# Patient Record
Sex: Female | Born: 1985 | Race: White | Hispanic: No | Smoking: Current every day smoker
Health system: Southern US, Community
[De-identification: ages and names within clinical notes are randomized; demographics above are authoritative.]

## PROBLEM LIST (undated history)

## (undated) DIAGNOSIS — I499 Cardiac arrhythmia, unspecified: Secondary | ICD-10-CM

## (undated) DIAGNOSIS — F419 Anxiety disorder, unspecified: Secondary | ICD-10-CM

## (undated) DIAGNOSIS — Z8489 Family history of other specified conditions: Secondary | ICD-10-CM

## (undated) DIAGNOSIS — T753XXA Motion sickness, initial encounter: Secondary | ICD-10-CM

## (undated) DIAGNOSIS — F32A Depression, unspecified: Secondary | ICD-10-CM

## (undated) DIAGNOSIS — K219 Gastro-esophageal reflux disease without esophagitis: Secondary | ICD-10-CM

## (undated) DIAGNOSIS — F329 Major depressive disorder, single episode, unspecified: Secondary | ICD-10-CM

## (undated) DIAGNOSIS — K529 Noninfective gastroenteritis and colitis, unspecified: Secondary | ICD-10-CM

## (undated) DIAGNOSIS — M419 Scoliosis, unspecified: Secondary | ICD-10-CM

## (undated) DIAGNOSIS — G43909 Migraine, unspecified, not intractable, without status migrainosus: Secondary | ICD-10-CM

## (undated) DIAGNOSIS — M26629 Arthralgia of temporomandibular joint, unspecified side: Secondary | ICD-10-CM

## (undated) DIAGNOSIS — M26649 Arthritis of unspecified temporomandibular joint: Secondary | ICD-10-CM

## (undated) DIAGNOSIS — M2669 Other specified disorders of temporomandibular joint: Secondary | ICD-10-CM

## (undated) DIAGNOSIS — M1990 Unspecified osteoarthritis, unspecified site: Secondary | ICD-10-CM

## (undated) DIAGNOSIS — N83209 Unspecified ovarian cyst, unspecified side: Secondary | ICD-10-CM

## (undated) DIAGNOSIS — G43109 Migraine with aura, not intractable, without status migrainosus: Secondary | ICD-10-CM

## (undated) HISTORY — PX: TEMPOROMANDIBULAR JOINT SURGERY: SHX35

## (undated) HISTORY — PX: OVARIAN CYST REMOVAL: SHX89

## (undated) HISTORY — DX: Migraine with aura, not intractable, without status migrainosus: G43.109

---
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
## 2004-08-13 ENCOUNTER — Observation Stay: Payer: Self-pay | Admitting: Certified Nurse Midwife

## 2004-12-20 ENCOUNTER — Observation Stay: Payer: Self-pay | Admitting: Obstetrics and Gynecology

## 2004-12-22 ENCOUNTER — Inpatient Hospital Stay: Payer: Self-pay | Admitting: Certified Nurse Midwife

## 2005-04-25 ENCOUNTER — Emergency Department: Payer: Self-pay | Admitting: Emergency Medicine

## 2007-11-23 ENCOUNTER — Ambulatory Visit: Payer: Self-pay | Admitting: Family Medicine

## 2008-04-29 ENCOUNTER — Ambulatory Visit: Payer: Self-pay | Admitting: Internal Medicine

## 2008-05-03 ENCOUNTER — Other Ambulatory Visit: Payer: Self-pay

## 2008-05-03 ENCOUNTER — Emergency Department: Payer: Self-pay | Admitting: Emergency Medicine

## 2008-05-04 ENCOUNTER — Ambulatory Visit: Payer: Self-pay | Admitting: Internal Medicine

## 2008-05-11 ENCOUNTER — Emergency Department: Payer: Self-pay | Admitting: Emergency Medicine

## 2008-11-29 ENCOUNTER — Emergency Department: Payer: Self-pay | Admitting: Emergency Medicine

## 2009-01-06 ENCOUNTER — Emergency Department: Payer: Self-pay | Admitting: Emergency Medicine

## 2009-01-08 ENCOUNTER — Ambulatory Visit: Payer: Self-pay | Admitting: Emergency Medicine

## 2009-03-04 ENCOUNTER — Encounter: Payer: Self-pay | Admitting: Obstetrics and Gynecology

## 2009-09-05 ENCOUNTER — Inpatient Hospital Stay: Payer: Self-pay | Admitting: Obstetrics and Gynecology

## 2009-11-23 DIAGNOSIS — M26609 Unspecified temporomandibular joint disorder, unspecified side: Secondary | ICD-10-CM | POA: Insufficient documentation

## 2012-02-16 ENCOUNTER — Ambulatory Visit: Payer: Self-pay | Admitting: Internal Medicine

## 2012-05-20 DIAGNOSIS — F329 Major depressive disorder, single episode, unspecified: Secondary | ICD-10-CM | POA: Insufficient documentation

## 2013-09-08 ENCOUNTER — Ambulatory Visit: Payer: Self-pay | Admitting: Medical

## 2014-11-19 ENCOUNTER — Ambulatory Visit: Payer: Self-pay | Admitting: Physician Assistant

## 2015-01-12 ENCOUNTER — Ambulatory Visit
Admit: 2015-01-12 | Disposition: A | Discharge status: Home / Self Care | Payer: Self-pay | Attending: Family Medicine | Admitting: Family Medicine

## 2015-03-18 ENCOUNTER — Encounter: Payer: Self-pay | Admitting: Registered Nurse

## 2015-03-18 ENCOUNTER — Ambulatory Visit
Admission: EM | Admit: 2015-03-18 | Discharge: 2015-03-18 | Disposition: A | Discharge status: Home / Self Care | Payer: Medicaid Other | Attending: Internal Medicine | Admitting: Internal Medicine

## 2015-03-18 DIAGNOSIS — M2660 Temporomandibular joint disorder, unspecified: Secondary | ICD-10-CM | POA: Diagnosis not present

## 2015-03-18 DIAGNOSIS — L2489 Irritant contact dermatitis due to other agents: Secondary | ICD-10-CM | POA: Insufficient documentation

## 2015-03-18 DIAGNOSIS — Z79899 Other long term (current) drug therapy: Secondary | ICD-10-CM | POA: Insufficient documentation

## 2015-03-18 DIAGNOSIS — L253 Unspecified contact dermatitis due to other chemical products: Secondary | ICD-10-CM | POA: Diagnosis not present

## 2015-03-18 DIAGNOSIS — N3090 Cystitis, unspecified without hematuria: Secondary | ICD-10-CM | POA: Diagnosis present

## 2015-03-18 DIAGNOSIS — F17210 Nicotine dependence, cigarettes, uncomplicated: Secondary | ICD-10-CM | POA: Insufficient documentation

## 2015-03-18 DIAGNOSIS — N390 Urinary tract infection, site not specified: Secondary | ICD-10-CM

## 2015-03-18 DIAGNOSIS — M1990 Unspecified osteoarthritis, unspecified site: Secondary | ICD-10-CM | POA: Diagnosis not present

## 2015-03-18 HISTORY — DX: Other specified disorders of temporomandibular joint: M26.69

## 2015-03-18 HISTORY — DX: Arthritis of unspecified temporomandibular joint: M26.649

## 2015-03-18 HISTORY — DX: Unspecified osteoarthritis, unspecified site: M19.90

## 2015-03-18 LAB — URINALYSIS COMPLETE WITH MICROSCOPIC (ARMC ONLY)
Bacteria, UA: NONE SEEN — AB
Glucose, UA: NEGATIVE mg/dL
Nitrite: NEGATIVE
PH: 6 (ref 5.0–8.0)
PROTEIN: 100 mg/dL — AB
Specific Gravity, Urine: 1.025 (ref 1.005–1.030)

## 2015-03-18 MED ORDER — NITROFURANTOIN MONOHYD MACRO 100 MG PO CAPS: 100.0000 mg | ORAL_CAPSULE | Freq: Two times a day (BID) | ORAL | Status: DC

## 2015-03-18 MED ORDER — PHENAZOPYRIDINE HCL 200 MG PO TABS: 200.0000 mg | ORAL_TABLET | Freq: Three times a day (TID) | ORAL | Status: DC | PRN

## 2015-03-18 NOTE — ED Notes (Signed)
Started 1 week ago with suprapubic pain when voiding. + burning with voiding and small amounts frequently.

## 2015-03-18 NOTE — Discharge Instructions (Signed)
Contact Dermatitis °Contact dermatitis is a reaction to certain substances that touch the skin. Contact dermatitis can be either irritant contact dermatitis or allergic contact dermatitis. Irritant contact dermatitis does not require previous exposure to the substance for a reaction to occur. Allergic contact dermatitis only occurs if you have been exposed to the substance before. Upon a repeat exposure, your body reacts to the substance.  °CAUSES  °Many substances can cause contact dermatitis. Irritant dermatitis is most commonly caused by repeated exposure to mildly irritating substances, such as: °· Makeup. °· Soaps. °· Detergents. °· Bleaches. °· Acids. °· Metal salts, such as nickel. °Allergic contact dermatitis is most commonly caused by exposure to: °· Poisonous plants. °· Chemicals (deodorants, shampoos). °· Jewelry. °· Latex. °· Neomycin in triple antibiotic cream. °· Preservatives in products, including clothing. °SYMPTOMS  °The area of skin that is exposed may develop: °· Dryness or flaking. °· Redness. °· Cracks. °· Itching. °· Pain or a burning sensation. °· Blisters. °With allergic contact dermatitis, there may also be swelling in areas such as the eyelids, mouth, or genitals.  °DIAGNOSIS  °Your caregiver can usually tell what the problem is by doing a physical exam. In cases where the cause is uncertain and an allergic contact dermatitis is suspected, a patch skin test may be performed to help determine the cause of your dermatitis. °TREATMENT °Treatment includes protecting the skin from further contact with the irritating substance by avoiding that substance if possible. Barrier creams, powders, and gloves may be helpful. Your caregiver may also recommend: °· Steroid creams or ointments applied 2 times daily. For best results, soak the rash area in cool water for 20 minutes. Then apply the medicine. Cover the area with a plastic wrap. You can store the steroid cream in the refrigerator for a "chilly"  effect on your rash. That may decrease itching. Oral steroid medicines may be needed in more severe cases. °· Antibiotics or antibacterial ointments if a skin infection is present. °· Antihistamine lotion or an antihistamine taken by mouth to ease itching. °· Lubricants to keep moisture in your skin. °· Burow's solution to reduce redness and soreness or to dry a weeping rash. Mix one packet or tablet of solution in 2 cups cool water. Dip a clean washcloth in the mixture, wring it out a bit, and put it on the affected area. Leave the cloth in place for 30 minutes. Do this as often as possible throughout the day. °· Taking several cornstarch or baking soda baths daily if the area is too large to cover with a washcloth. °Harsh chemicals, such as alkalis or acids, can cause skin damage that is like a burn. You should flush your skin for 15 to 20 minutes with cold water after such an exposure. You should also seek immediate medical care after exposure. Bandages (dressings), antibiotics, and pain medicine may be needed for severely irritated skin.  °HOME CARE INSTRUCTIONS °· Avoid the substance that caused your reaction. °· Keep the area of skin that is affected away from hot water, soap, sunlight, chemicals, acidic substances, or anything else that would irritate your skin. °· Do not scratch the rash. Scratching may cause the rash to become infected. °· You may take cool baths to help stop the itching. °· Only take over-the-counter or prescription medicines as directed by your caregiver. °· See your caregiver for follow-up care as directed to make sure your skin is healing properly. °SEEK MEDICAL CARE IF:  °· Your condition is not better after 3   days of treatment.  You seem to be getting worse.  You see signs of infection such as swelling, tenderness, redness, soreness, or warmth in the affected area.  You have any problems related to your medicines. Document Released: 09/08/2000 Document Revised: 12/04/2011  Document Reviewed: 02/14/2011 [facility] Patient Information 2015 [place], [place]. This information is not intended to replace advice given to you by your health care provider. Make sure you discuss any questions you have with your health care provider. Urinary Tract Infection Urinary tract infections (UTIs) can develop anywhere along your urinary tract. Your urinary tract is your body's drainage system for removing wastes and extra water. Your urinary tract includes two kidneys, two ureters, a bladder, and a urethra. Your kidneys are a pair of bean-shaped organs. Each kidney is about the size of your fist. They are located below your ribs, one on each side of your spine. CAUSES Infections are caused by microbes, which are microscopic organisms, including fungi, viruses, and bacteria. These organisms are so small that they can only be seen through a microscope. Bacteria are the microbes that most commonly cause UTIs. SYMPTOMS  Symptoms of UTIs may vary by age and gender of the patient and by the location of the infection. Symptoms in young women typically include a frequent and intense urge to urinate and a painful, burning feeling in the bladder or urethra during urination. Older women and men are more likely to be tired, shaky, and weak and have muscle aches and abdominal pain. A fever may mean the infection is in your kidneys. Other symptoms of a kidney infection include pain in your back or sides below the ribs, nausea, and vomiting. DIAGNOSIS To diagnose a UTI, your caregiver will ask you about your symptoms. Your caregiver also will ask to provide a urine sample. The urine sample will be tested for bacteria and white blood cells. White blood cells are made by your body to help fight infection. TREATMENT  Typically, UTIs can be treated with medication. Because most UTIs are caused by a bacterial infection, they usually can be treated with the use of antibiotics. The choice of antibiotic and length of  treatment depend on your symptoms and the type of bacteria causing your infection. HOME CARE INSTRUCTIONS  If you were prescribed antibiotics, take them exactly as your caregiver instructs you. Finish the medication even if you feel better after you have only taken some of the medication.  Drink enough water and fluids to keep your urine clear or pale yellow.  Avoid caffeine, tea, and carbonated beverages. They tend to irritate your bladder.  Empty your bladder often. Avoid holding urine for long periods of time.  Empty your bladder before and after sexual intercourse.  After a bowel movement, women should cleanse from front to back. Use each tissue only once. SEEK MEDICAL CARE IF:   You have back pain.  You develop a fever.  Your symptoms do not begin to resolve within 3 days. SEEK IMMEDIATE MEDICAL CARE IF:   You have severe back pain or lower abdominal pain.  You develop chills.  You have nausea or vomiting.  You have continued burning or discomfort with urination. MAKE SURE YOU:   Understand these instructions.  Will watch your condition.  Will get help right away if you are not doing well or get worse. Document Released: 06/21/2005 Document Revised: 03/12/2012 Document Reviewed: 10/20/2011 [facility] Patient Information 2015 [place], [place]. This information is not intended to replace advice given to you by your health  care provider. Make sure you discuss any questions you have with your health care provider.

## 2015-03-18 NOTE — ED Provider Notes (Addendum)
CSN: [id number]     Arrival date & time 03/18/15  1240 History   First MD Initiated Contact with Patient 03/18/15 1324     Chief Complaint  Patient presents with  . Cystitis   (Consider location/radiation/quality/duration/timing/severity/associated sxs/prior Treatment) HPI Comments: Caucasian female here for dysuria/cramping x 1 week. New sexual partner in past month not using condoms.  Had low back pain yesterday that has resolved.  New rash upper chest noticed today recently had red hilights placed in her blond hair.  Sometimes gets a couple lesions/outbreaks with menstrual cycle but this is many more lesions than usual  [employer] started summer vacation 2 weeks ago.  The history is provided by the patient.    Past Medical History  Diagnosis Date  . Arthritis   . TMJ arthritis    Past Surgical History  Procedure Laterality Date  . Temporomandibular joint surgery     Family History  Problem Relation Age of Onset  . Heart attack Mother   . Hypertension Father    History  Substance Use Topics  . Smoking status: Current Every Day Smoker -- 0.50 packs/day    Types: Cigarettes  . Smokeless tobacco: Not on file  . Alcohol Use: Yes     Comment: rarely   OB History    No data available     Review of Systems  Constitutional: Negative for fever, chills, diaphoresis, activity change, appetite change and fatigue.  HENT: Negative for congestion, ear pain, facial swelling and mouth sores.   Eyes: Negative for photophobia, pain, discharge, redness, itching and visual disturbance.  Respiratory: Negative for cough, choking, shortness of breath, wheezing and stridor.   Cardiovascular: Negative for chest pain, palpitations and leg swelling.  Gastrointestinal: Positive for abdominal pain. Negative for nausea, vomiting, diarrhea, constipation, blood in stool, abdominal distention, anal bleeding and rectal pain.  Endocrine: Negative for cold intolerance and heat intolerance.   Genitourinary: Positive for dysuria and frequency. Negative for hematuria, vaginal bleeding, vaginal discharge, difficulty urinating and vaginal pain.  Musculoskeletal: Positive for back pain. Negative for myalgias, joint swelling, arthralgias, gait problem, neck pain and neck stiffness.  Allergic/Immunologic: Negative for environmental allergies and food allergies.  Neurological: Negative for dizziness, tremors, syncope, facial asymmetry, speech difficulty, weakness, light-headedness and headaches.  Hematological: Negative for adenopathy. Does not bruise/bleed easily.  Psychiatric/Behavioral: Negative for behavioral problems, confusion, sleep disturbance and agitation.    Allergies  Review of patient's allergies indicates no known allergies.  Home Medications   Prior to Admission medications   Medication Sig Start Date End Date Taking? Authorizing Provider  celecoxib (CELEBREX) 50 MG capsule Take 50 mg by mouth.   Yes Historical Provider, MD  sertraline (ZOLOFT) 50 MG tablet Take 50 mg by mouth daily.   Yes Historical Provider, MD  nitrofurantoin, macrocrystal-monohydrate, (MACROBID) 100 MG capsule Take 1 capsule (100 mg total) by mouth 2 (two) times daily. 03/18/15   Barbaraann Barthel, NP  phenazopyridine (PYRIDIUM) 200 MG tablet Take 1 tablet (200 mg total) by mouth 3 (three) times daily as needed for pain. 03/18/15   Jarold Song Betancourt, NP   BP 124/87 mmHg  Pulse 113  Temp(Src) 98 F (36.7 C) (Oral)  Resp 16  Ht  (1.575 m)  Wt 150 lb (68.04 kg)  BMI 27.43 kg/m2  SpO2 100%  LMP 02/26/2015 (Approximate) Physical Exam  Constitutional: She is oriented to person, place, and time. Vital signs are normal. She appears well-developed and well-nourished. No distress.  HENT:  Head:  Normocephalic and atraumatic.  Right Ear: External ear normal.  Left Ear: External ear normal.  Nose: Nose normal.  Mouth/Throat: Oropharynx is clear and moist. No oropharyngeal exudate.  Eyes:  Conjunctivae, EOM and lids are normal. Pupils are equal, round, and reactive to light. Right eye exhibits no discharge. Left eye exhibits no discharge. No scleral icterus.  Neck: Trachea normal and normal range of motion. Neck supple. No tracheal deviation present. No thyromegaly present.  Cardiovascular: Normal rate, regular rhythm, normal heart sounds and intact distal pulses.  Exam reveals no gallop and no friction rub.   No murmur heard. Pulmonary/Chest: Effort normal and breath sounds normal. No stridor.  Abdominal: Soft. Bowel sounds are normal. She exhibits no distension and no mass. There is tenderness in the epigastric area. There is no rigidity, no rebound, no guarding, no CVA tenderness, no tenderness at McBurney's point and negative Murphy's sign.  Dull to percussion x 4 quads  Musculoskeletal: Normal range of motion. She exhibits no edema or tenderness.  Lymphadenopathy:    She has no cervical adenopathy.  Neurological: She is alert and oriented to person, place, and time. She displays normal reflexes. No cranial nerve deficit. She exhibits normal muscle tone. Coordination normal.  Skin: Skin is warm, dry and intact. Rash noted. Rash is maculopapular and pustular. She is not diaphoretic. There is erythema. No pallor.  Dry Closed comedone with fine pustule centrally scattered upper chest posterior neck/shoulders with slight erythema not TTP  Psychiatric: She has a normal mood and affect. Her speech is normal and behavior is normal. Judgment and thought content normal. Cognition and memory are normal.  Nursing note and vitals reviewed.   ED Course  Procedures (including critical care time) Labs Review Labs Reviewed  URINALYSIS COMPLETEWITH MICROSCOPIC ([facility] ONLY) - Abnormal; Notable for the following:    Color, Urine AMBER (*)    APPearance CLOUDY (*)    Bilirubin Urine 1+ (*)    Ketones, ur TRACE (*)    Hgb urine dipstick 3+ (*)    Protein, ur 100 (*)    Leukocytes, UA 2+ (*)     Bacteria, UA NONE SEEN (*)    Squamous Epithelial / LPF 0-5 (*)    All other components within normal limits  URINE CULTURE    Imaging Review No results found.   MDM   1. Dermatitis, contact, from chemicals   2. UTI (lower urinary tract infection)    Medications as directed.  Given Rx for macrobid  po BID and pyridium  po TID prnPatient is also to push fluids and may use Pyridium po as needed. Call or return to clinic as needed if these symptoms worsen or fail to improve as anticipated.  Exitcare handout on cystitis given to patient Patient verbalized agreement and understanding of treatment plan and had no further questions at this time. P2:  Hydrate and cranberry juice Medication as directed e.g. OTC benadryl, hydrocortisone, calamine lotion.  Symptomatic therapy suggested.  Warm to cool water soaks and/or oatmeal baths.  Call or return to clinic as needed if these symptoms worsen or fail to improve as anticipated.  Exitcare handout on dermatitis given to patient.  Patient verbalized agreement and understanding of treatment plan.   P2:  Avoidance and hand washing.    Barbaraann Barthel, NP 03/18/15 1428  Urine culture e.coli sensitive to macrobid.  Telephone message left for patient with results and she is to contact clinic if symptoms not resolving or further questions.  Inetta Fermo A  Betancourt, NP 03/20/15 1825

## 2015-03-20 LAB — URINE CULTURE

## 2015-05-26 ENCOUNTER — Ambulatory Visit
Admission: EM | Admit: 2015-05-26 | Discharge: 2015-05-26 | Disposition: A | Discharge status: Home / Self Care | Payer: Medicaid Other | Attending: Family Medicine | Admitting: Family Medicine

## 2015-05-26 ENCOUNTER — Encounter: Payer: Self-pay | Admitting: Emergency Medicine

## 2015-05-26 DIAGNOSIS — M1990 Unspecified osteoarthritis, unspecified site: Secondary | ICD-10-CM | POA: Insufficient documentation

## 2015-05-26 DIAGNOSIS — F17210 Nicotine dependence, cigarettes, uncomplicated: Secondary | ICD-10-CM | POA: Insufficient documentation

## 2015-05-26 DIAGNOSIS — N390 Urinary tract infection, site not specified: Secondary | ICD-10-CM | POA: Diagnosis not present

## 2015-05-26 DIAGNOSIS — R350 Frequency of micturition: Secondary | ICD-10-CM | POA: Diagnosis present

## 2015-05-26 DIAGNOSIS — R300 Dysuria: Secondary | ICD-10-CM | POA: Diagnosis present

## 2015-05-26 LAB — URINALYSIS COMPLETE WITH MICROSCOPIC (ARMC ONLY): PH: 0 — AB (ref 5.0–8.0)

## 2015-05-26 LAB — PREGNANCY, URINE: Preg Test, Ur: NEGATIVE

## 2015-05-26 MED ORDER — CIPROFLOXACIN HCL 500 MG PO TABS: 500.0000 mg | ORAL_TABLET | Freq: Two times a day (BID) | ORAL | Status: DC

## 2015-05-26 NOTE — ED Provider Notes (Signed)
[facility] Emergency Department Provider Note  ____________________________________________  Time seen: Approximately 1:17 PM  I have reviewed the triage vital signs and the nursing notes.   HISTORY  Chief Complaint Urinary Tract Infection   HPI Vicki Dominguez is a 29 y.o. female presents for 3 days of urinary frequency and burning with urination. States feels like UTI. Denies back pain, abdominal pain, vomiting, nausea. Denies vaginal discharge, vaginal odor, or recent change in sexual partner. Denies concern for STDs. Reports same sexual partner for greater than 6 months and always uses condoms. States does not always void after intercourse, especially recently. Denies pain except for during voiding. States took OTC AZO today which helped with burning. States last UTI 3 weeks ago and treated with bactrim, and states approximately one month prior to that UTI treated with macrobid. States prior to last two UTIs no UTI in several years. Reports continues to eat and drink well.    Past Medical History  Diagnosis Date  . Arthritis   . TMJ arthritis     There are no active problems to display for this patient.   Past Surgical History  Procedure Laterality Date  . Temporomandibular joint surgery      Current Outpatient Rx  Name  Route  Sig  Dispense  Refill  .           .           .           .           .             Allergies Review of patient's allergies indicates no known allergies.  Family History  Problem Relation Age of Onset  . Heart attack Mother   . Hypertension Father     Social History Social History  Substance Use Topics  . Smoking status: Current Every Day Smoker -- 0.50 packs/day    Types: Cigarettes  . Smokeless tobacco: None  . Alcohol Use: Yes     Comment: rarely    Review of Systems Constitutional: No fever/chills Eyes: No visual changes. ENT: No sore throat. Cardiovascular: Denies chest pain. Respiratory:  Denies shortness of breath. Gastrointestinal: No abdominal pain.  No nausea, no vomiting.  No diarrhea.  No constipation. Genitourinary: positive for dysuria. Musculoskeletal: Negative for back pain. Skin: Negative for rash. Neurological: Negative for headaches, focal weakness or numbness.  10-point ROS otherwise negative.  ____________________________________________   PHYSICAL EXAM:  VITAL SIGNS: ED Triage Vitals  Enc Vitals Group     BP 05/26/15 1224 111/76 mmHg     Pulse Rate 05/26/15 1224 92     Resp 05/26/15 1224 16     Temp 05/26/15 1224 97.9 F (36.6 C)     Temp Source 05/26/15 1224 Oral     SpO2 05/26/15 1224 99 %     Weight 05/26/15 1224 150 lb (68.04 kg)     Height 05/26/15 1224  (1.575 m)     Head Cir --      Peak Flow --      Pain Score 05/26/15 1227 3     Pain Loc --      Pain Edu? --      Excl. in GC? --     Constitutional: Alert and oriented. Well appearing and in no acute distress. Eyes: Conjunctivae are normal. PERRL. EOMI. Head: Atraumatic.  Nose: No congestion/rhinnorhea.  Mouth/Throat: Mucous membranes are moist.  Oropharynx  non-erythematous.  Neck: No stridor.  No cervical spine tenderness to palpation. Hematological/Lymphatic/Immunilogical: No cervical lymphadenopathy. Cardiovascular: Normal rate, regular rhythm. Grossly normal heart sounds.  Good peripheral circulation. Respiratory: Normal respiratory effort.  No retractions. Lungs CTAB. Gastrointestinal: Soft and nontender. No distention. Normal Bowel sounds.  No CVA tenderness. Musculoskeletal: No lower or upper extremity tenderness nor edema.   Neurologic:  Normal speech and language. No gross focal neurologic deficits are appreciated. No gait instability. Skin:  Skin is warm, dry and intact. No rash noted. Psychiatric: Mood and affect are normal. Speech and behavior are normal.  ____________________________________________   LABS (all labs ordered are listed, but only abnormal  results are displayed)  Labs Reviewed  URINALYSIS COMPLETEWITH MICROSCOPIC ([facility] ONLY) - Abnormal; Notable for the following:    Color, Urine ORANGE (*)    APPearance HAZY (*)    Glucose, UA   (*)    Value: TEST NOT REPORTED DUE TO COLOR INTERFERENCE OF URINE PIGMENT   Bilirubin Urine   (*)    Value: TEST NOT REPORTED DUE TO COLOR INTERFERENCE OF URINE PIGMENT   Ketones, ur   (*)    Value: TEST NOT REPORTED DUE TO COLOR INTERFERENCE OF URINE PIGMENT   Hgb urine dipstick   (*)    Value: TEST NOT REPORTED DUE TO COLOR INTERFERENCE OF URINE PIGMENT   pH 0.0 (*)    Protein, ur   (*)    Value: TEST NOT REPORTED DUE TO COLOR INTERFERENCE OF URINE PIGMENT   Nitrite   (*)    Value: TEST NOT REPORTED DUE TO COLOR INTERFERENCE OF URINE PIGMENT   Leukocytes, UA   (*)    Value: TEST NOT REPORTED DUE TO COLOR INTERFERENCE OF URINE PIGMENT   Bacteria, UA FEW (*)    Squamous Epithelial / LPF 6-30 (*)    All other components within normal limits  URINE CULTURE  PREGNANCY, URINE  urine pregnancy negative  ________________________________________   INITIAL IMPRESSION / ASSESSMENT AND PLAN / ED COURSE  Pertinent labs & imaging results that were available during my care of the patient were reviewed by me and considered in my medical decision making (see chart for details).  Very well appearing. No acute distress. Denies fevers. Reports x 3 days of intermittent dysuria. Urine reviewed, suspect OTC Azo interference with coloration, positive bacteria. Will treat with oral cipro as recent bactrim and macrobid use. Counseled regarding voiding post intercourse. Counseled regarding follow up with PCP next week and follow up especially if UTIs continue and need for further evaluation. Patient verbalized understanding and agreed to plan.  ____________________________________________   FINAL CLINICAL IMPRESSION(S) / ED DIAGNOSES  Final diagnoses:  UTI (lower urinary tract infection)       Renford Dills, NP 05/26/15 1330  Renford Dills, NP 05/26/15 1330

## 2015-05-26 NOTE — Discharge Instructions (Signed)
Take medication as prescribed. Drink plenty of water. Rest.   Follow up with your primary care physician at [facility] next week for follow up.   Return to Urgent care for abdominal pain, fever, continued urinary discomfort, new or worsening concerns.   Urinary Tract Infection Urinary tract infections (UTIs) can develop anywhere along your urinary tract. Your urinary tract is your body's drainage system for removing wastes and extra water. Your urinary tract includes two kidneys, two ureters, a bladder, and a urethra. Your kidneys are a pair of bean-shaped organs. Each kidney is about the size of your fist. They are located below your ribs, one on each side of your spine. CAUSES Infections are caused by microbes, which are microscopic organisms, including fungi, viruses, and bacteria. These organisms are so small that they can only be seen through a microscope. Bacteria are the microbes that most commonly cause UTIs. SYMPTOMS  Symptoms of UTIs may vary by age and gender of the patient and by the location of the infection. Symptoms in young women typically include a frequent and intense urge to urinate and a painful, burning feeling in the bladder or urethra during urination. Older women and men are more likely to be tired, shaky, and weak and have muscle aches and abdominal pain. A fever may mean the infection is in your kidneys. Other symptoms of a kidney infection include pain in your back or sides below the ribs, nausea, and vomiting. DIAGNOSIS To diagnose a UTI, your caregiver will ask you about your symptoms. Your caregiver also will ask to provide a urine sample. The urine sample will be tested for bacteria and white blood cells. White blood cells are made by your body to help fight infection. TREATMENT  Typically, UTIs can be treated with medication. Because most UTIs are caused by a bacterial infection, they usually can be treated with the use of antibiotics. The choice of antibiotic and  length of treatment depend on your symptoms and the type of bacteria causing your infection. HOME CARE INSTRUCTIONS  If you were prescribed antibiotics, take them exactly as your caregiver instructs you. Finish the medication even if you feel better after you have only taken some of the medication.  Drink enough water and fluids to keep your urine clear or pale yellow.  Avoid caffeine, tea, and carbonated beverages. They tend to irritate your bladder.  Empty your bladder often. Avoid holding urine for long periods of time.  Empty your bladder before and after sexual intercourse.  After a bowel movement, women should cleanse from front to back. Use each tissue only once. SEEK MEDICAL CARE IF:   You have back pain.  You develop a fever.  Your symptoms do not begin to resolve within 3 days. SEEK IMMEDIATE MEDICAL CARE IF:   You have severe back pain or lower abdominal pain.  You develop chills.  You have nausea or vomiting.  You have continued burning or discomfort with urination. MAKE SURE YOU:   Understand these instructions.  Will watch your condition.  Will get help right away if you are not doing well or get worse. Document Released: 06/21/2005 Document Revised: 03/12/2012 Document Reviewed: 10/20/2011 [facility] Patient Information 2015 [place], [place]. This information is not intended to replace advice given to you by your health care provider. Make sure you discuss any questions you have with your health care provider.

## 2015-05-26 NOTE — ED Notes (Signed)
uti s/s x 3day

## 2015-05-28 LAB — URINE CULTURE
Culture: >=100000
Special Requests: NORMAL

## 2016-02-24 ENCOUNTER — Ambulatory Visit
Admission: EM | Admit: 2016-02-24 | Discharge: 2016-02-24 | Disposition: A | Discharge status: Home / Self Care | Payer: Medicaid Other | Attending: Family Medicine | Admitting: Family Medicine

## 2016-02-24 DIAGNOSIS — Z8249 Family history of ischemic heart disease and other diseases of the circulatory system: Secondary | ICD-10-CM | POA: Diagnosis not present

## 2016-02-24 DIAGNOSIS — B349 Viral infection, unspecified: Secondary | ICD-10-CM | POA: Diagnosis not present

## 2016-02-24 DIAGNOSIS — K529 Noninfective gastroenteritis and colitis, unspecified: Secondary | ICD-10-CM | POA: Diagnosis not present

## 2016-02-24 DIAGNOSIS — F17210 Nicotine dependence, cigarettes, uncomplicated: Secondary | ICD-10-CM | POA: Insufficient documentation

## 2016-02-24 DIAGNOSIS — J029 Acute pharyngitis, unspecified: Secondary | ICD-10-CM | POA: Diagnosis not present

## 2016-02-24 LAB — RAPID STREP SCREEN (MED CTR MEBANE ONLY): STREPTOCOCCUS, GROUP A SCREEN (DIRECT): NEGATIVE

## 2016-02-24 MED ORDER — BISMUTH SUBSALICYLATE 262 MG/15ML PO SUSP: ORAL | Status: DC

## 2016-02-24 MED ORDER — ONDANSETRON 8 MG PO TBDP: 8.0000 mg | ORAL_TABLET | Freq: Three times a day (TID) | ORAL | Status: DC | PRN

## 2016-02-24 MED ORDER — ONDANSETRON 8 MG PO TBDP
8.0000 mg | ORAL_TABLET | Freq: Once | ORAL | Status: AC
  Administered 2016-02-24: 8 mg via ORAL

## 2016-02-24 MED ORDER — RANITIDINE HCL 150 MG PO CAPS: ORAL_CAPSULE | ORAL | Status: DC

## 2016-02-24 NOTE — Discharge Instructions (Signed)
Norovirus Infection A norovirus infection is caused by exposure to a virus in a group of similar viruses (noroviruses). This type of infection causes inflammation in your stomach and intestines (gastroenteritis). Norovirus is the most common cause of gastroenteritis. It also causes food poisoning. Anyone can get a norovirus infection. It spreads very easily (contagious). You can get it from contaminated food, water, surfaces, or other people. Norovirus is found in the stool or vomit of infected people. You can spread the infection as soon as you feel sick until 2 weeks after you recover.  Symptoms usually begin within 2 days after you become infected. Most norovirus symptoms affect the digestive system. CAUSES Norovirus infection is caused by contact with norovirus. You can catch norovirus if you:  Eat or drink something contaminated with norovirus.  Touch surfaces or objects contaminated with norovirus and then put your hand in your mouth.  Have direct contact with an infected person who has symptoms.  Share food, drink, or utensils with someone with who is sick with norovirus. SIGNS AND SYMPTOMS Symptoms of norovirus may include:  Nausea.  Vomiting.  Diarrhea.  Stomach cramps.  Fever.  Chills.  Headache.  Muscle aches.  Tiredness. DIAGNOSIS Your health care provider may suspect norovirus based on your symptoms and physical exam. Your health care provider may also test a sample of your stool or vomit for the virus.  TREATMENT There is no specific treatment for norovirus. Most people get better without treatment in about 2 days. HOME CARE INSTRUCTIONS  Replace lost fluids by drinking plenty of water or rehydration fluids containing important minerals called electrolytes. This prevents dehydration. Drink enough fluid to keep your urine clear or pale yellow.  Do not prepare food for others while you are infected. Wait at least 3 days after recovering from the illness to do  that. PREVENTION   Wash your hands often, especially after using the toilet or changing a diaper.  Wash fruits and vegetables thoroughly before preparing or serving them.  Throw out any food that a sick person may have touched.  Disinfect contaminated surfaces immediately after someone in the household has been sick. Use a bleach-based household cleaner.  Immediately remove and wash soiled clothes or sheets. SEEK MEDICAL CARE IF:  Your vomiting, diarrhea, and stomach pain is getting worse.  Your symptoms of norovirus do not go away after 2-3 days. SEEK IMMEDIATE MEDICAL CARE IF:  You develop symptoms of dehydration that do not improve with fluid replacement. This may include:  Excessive sleepiness.  Lack of tears.  Dry mouth.  Dizziness when standing.  Weak pulse.   This information is not intended to replace advice given to you by your health care provider. Make sure you discuss any questions you have with your health care provider.   Document Released: 12/02/2002 Document Revised: 10/02/2014 Document Reviewed: 02/19/2014 Elsevier Interactive Patient Education 2016 Else[employer] Inc[employer]  Pharyngitis Pharyngitis is a sore throat (pharynx). There is redness, pain, and swelling of your throat. HOME CARE   Drink enough fluids to keep your pee (urine) clear or pale yellow.  Only take medicine as told by your doctor.  You may get sick again if you do not take medicine as told. Finish your medicines, even if you start to feel better.  Do not take aspirin.  Rest.  Rinse your mouth (gargle) with salt water ( tsp of salt per 1 qt of water) every 1-2 hours. This will help the pain.  If you are not at risk for  choking, you can suck on hard candy or sore throat lozenges. GET HELP IF:  You have large, tender lumps on your neck.  You have a rash.  You cough up green, yellow-brown, or bloody spit. GET HELP RIGHT AWAY IF:   You have a stiff neck.  You drool or cannot swallow  liquids.  You throw up (vomit) or are not able to keep medicine or liquids down.  You have very bad pain that does not go away with medicine.  You have problems breathing (not from a stuffy nose). MAKE SURE YOU:   Understand these instructions.  Will watch your condition.  Will get help right away if you are not doing well or get worse.   This information is not intended to replace advice given to you by your health care provider. Make sure you discuss any questions you have with your health care provider.   Document Released: 02/28/2008 Document Revised: 07/02/2013 Document Reviewed: 05/19/2013 Elsevier Interactive Patient Education 2016 Elsevier Inc.  Upper Respiratory Infection, Adult Most upper respiratory infections (URIs) are caused by a virus. A URI affects the nose, throat, and upper air passages. The most common type of URI is often called "the common cold." HOME CARE   Take medicines only as told by your doctor.  Gargle warm saltwater or take cough drops to comfort your throat as told by your doctor.  Use a warm mist humidifier or inhale steam from a shower to increase air moisture. This may make it easier to breathe.  Drink enough fluid to keep your pee (urine) clear or pale yellow.  Eat soups and other clear broths.  Have a healthy diet.  Rest as needed.  Go back to work when your fever is gone or your doctor says it is okay.  You may need to stay home longer to avoid giving your URI to others.  You can also wear a face mask and wash your hands often to prevent spread of the virus.  Use your inhaler more if you have asthma.  Do not use any tobacco products, including cigarettes, chewing tobacco, or electronic cigarettes. If you need help quitting, ask your doctor. GET HELP IF:  You are getting worse, not better.  Your symptoms are not helped by medicine.  You have chills.  You are getting more short of breath.  You have brown or red mucus.  You  have yellow or brown discharge from your nose.  You have pain in your face, especially when you bend forward.  You have a fever.  You have puffy (swollen) neck glands.  You have pain while swallowing.  You have white areas in the back of your throat. GET HELP RIGHT AWAY IF:   You have very bad or constant:  Headache.  Ear pain.  Pain in your forehead, behind your eyes, and over your cheekbones (sinus pain).  Chest pain.  You have long-lasting (chronic) lung disease and any of the following:  Wheezing.  Long-lasting cough.  Coughing up blood.  A change in your usual mucus.  You have a stiff neck.  You have changes in your:  Vision.  Hearing.  Thinking.  Mood. MAKE SURE YOU:   Understand these instructions.  Will watch your condition.  Will get help right away if you are not doing well or get worse.   This information is not intended to replace advice given to you by your health care provider. Make sure you discuss any questions you have with your  health care provider.   Document Released: 02/28/2008 Document Revised: 01/26/2015 Document Reviewed: 12/17/2013 Elsevier Interactive Patient Education [employer] Elsevier Inc.

## 2016-02-24 NOTE — ED Notes (Signed)
Patient complains of sore throat, diarrhea. Patient states that Diarrhea started yesterday and she has been having some abdominal gurgling. Patient states that sore throat started this morning.

## 2016-02-24 NOTE — ED Provider Notes (Signed)
CSN: [id number]     Arrival date & time 02/24/16  1854 History   First MD Initiated Contact with Patient 02/24/16 2039    Nurses notes were reviewed. Chief Complaint  Patient presents with  . Sore Throat  Patient's here because of sore throat and stomach pain. She states that everything started yesterday. The abdominal discomfort got worse as the day went on and her sore throat became worse as well. States that she was having diarrhea and abdominal discomfort. States she feels her GI track consistent turning. Diarrhea was described as being very loose along with the diarrhea that just became sore. She denies any fever or being around anyone who is sick.  Patient denies having urinary frequency or burning with urination.  Unfortunately patient does smoke. She's had TMJ surgery. Mother with MI and father with hypertension. 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    (Consider location/radiation/quality/duration/timing/severity/associated sxs/prior Treatment) Patient is a 30 y.o. female presenting with pharyngitis and abdominal pain. The history is provided by the patient. No language interpreter was used.  Sore Throat This is a new problem. The current episode started yesterday. The problem occurs constantly. The problem has been gradually worsening. Associated symptoms include abdominal pain. Pertinent negatives include no chest pain, no headaches and no shortness of breath. Nothing aggravates the symptoms. Nothing relieves the symptoms. She has tried nothing for the symptoms.  Abdominal Pain Pain location:  Generalized Pain radiates to:  Does not radiate Onset quality:  Sudden Progression:  Waxing and waning Context: not medication withdrawal   Associated symptoms: diarrhea and nausea   Associated symptoms: no chest pain and no shortness of breath     Past Medical History  Diagnosis Date  . Arthritis   . TMJ arthritis    Past Surgical History  Procedure Laterality Date  .  Temporomandibular joint surgery     Family History  Problem Relation Age of Onset  . Heart attack Mother   . Hypertension Father    Social History  Substance Use Topics  . Smoking status: Current Every Day Smoker -- 0.50 packs/day    Types: Cigarettes  . Smokeless tobacco: None  . Alcohol Use: 0.0 oz/week    0 Standard drinks or equivalent per week     Comment: occasionally   OB History    No data available     Review of Systems  Respiratory: Negative for shortness of breath.   Cardiovascular: Negative for chest pain.  Gastrointestinal: Positive for nausea, abdominal pain and diarrhea.  Neurological: Negative for headaches.  All other systems reviewed and are negative.   Allergies  Review of patient's allergies indicates no known allergies.  Home Medications   Prior to Admission medications   Medication Sig Start Date End Date Taking? Authorizing Provider  ALPRAZolam Prudy Feeler) 1 MG tablet Take 1 mg by mouth at bedtime as needed for anxiety.   Yes Historical Provider, MD  ibuprofen (ADVIL,MOTRIN) 200 MG tablet Take 200 mg by mouth every 6 (six) hours as needed.   Yes Historical Provider, MD  PARoxetine (PAXIL) 20 MG tablet Take 20 mg by mouth daily.   Yes Historical Provider, MD  promethazine (PHENERGAN) 25 MG tablet Take 25 mg by mouth every 6 (six) hours as needed for nausea or vomiting.   Yes Historical Provider, MD  bismuth subsalicylate (PEPTO-BISMOL) 262 MG/15ML suspension 2 tablespoon or one double strength tablet 3 times a day with Zantac for the next 3-5 days 02/24/16   Hassan Rowan, MD  celecoxib (CELEBREX) 50 MG capsule Take 50 mg by mouth.    Historical Provider, MD  ciprofloxacin (CIPRO) 500 MG tablet Take 1 tablet (500 mg total) by mouth 2 (two) times daily. For 7 days 05/26/15   Renford DillsLindsey Miller, NP  nitrofurantoin, macrocrystal-monohydrate, (MACROBID) 100 MG capsule Take 1 capsule (100 mg total) by mouth 2 (two) times daily. 03/18/15   Barbaraann Barthelina A Betancourt, NP   ondansetron (ZOFRAN ODT) 8 MG disintegrating tablet Take 1 tablet (8 mg total) by mouth every 8 (eight) hours as needed for nausea or vomiting. 02/24/16   Hassan RowanEugene Yu Cragun, MD  phenazopyridine (PYRIDIUM) 200 MG tablet Take 1 tablet (200 mg total) by mouth 3 (three) times daily as needed for pain. 03/18/15   Barbaraann Barthelina A Betancourt, NP  ranitidine (ZANTAC) 150 MG capsule 1 capsule 3 times a day for the next 3-5 days, take the Pepto-Bismol 02/24/16   Hassan RowanEugene Kaileia Flow, MD  sertraline (ZOLOFT) 50 MG tablet Take 100 mg by mouth daily.     Historical Provider, MD   Meds Ordered and Administered this Visit   Medications  ondansetron (ZOFRAN-ODT) disintegrating tablet 8 mg (8 mg Oral Given 02/24/16 2119)    BP 115/84 mmHg  Pulse 116  Temp(Src) 98.7 F (37.1 C) (Tympanic)  Resp 16  Ht 5\' 2"  (1.575 m)  Wt 138 lb (62.596 kg)  BMI 25.23 kg/m2  SpO2 99%  LMP 02/03/2016 No data found.   Physical Exam  Constitutional: She is oriented to person, place, and time. She appears well-developed and well-nourished.  HENT:  Head: Normocephalic and atraumatic.  Right Ear: Hearing, tympanic membrane and external ear normal.  Left Ear: Hearing, tympanic membrane and external ear normal.  Nose: Mucosal edema present. No rhinorrhea.  Mouth/Throat: Uvula is midline. No oral lesions. No lacerations or dental caries. Posterior oropharyngeal erythema present.  Eyes: Conjunctivae are normal. Pupils are equal, round, and reactive to light.  Neck: Normal range of motion. Neck supple.  Cardiovascular: Normal rate, regular rhythm and normal heart sounds.   Pulmonary/Chest: Effort normal and breath sounds normal.  Abdominal: Soft. She exhibits no distension. There is no tenderness. There is no rebound.  Musculoskeletal: Normal range of motion.  Neurological: She is alert and oriented to person, place, and time.  Skin: Skin is warm and dry.  Psychiatric: She has a normal mood and affect.  Vitals reviewed.   ED Course  Procedures  (including critical care time)  Labs Review Labs Reviewed  RAPID STREP SCREEN (NOT AT [facility])  CULTURE, GROUP A STREP [facility])    Imaging Review No results found.   Visual Acuity Review  Right Eye Distance:   Left Eye Distance:   Bilateral Distance:    Right Eye Near:   Left Eye Near:    Bilateral Near:     Results for orders placed or performed during the hospital encounter of 02/24/16  Rapid strep screen  Result Value Ref Range   Streptococcus, Group A Screen (Direct) NEGATIVE NEGATIVE      MDM   1. Acute gastroenteritis   2. Viral illness   3. Pharyngitis    Patient was warned she needs to stop smoking. Strep test was negative. Will treat gastroenteritis with Zofran 8 mg. This also recommend she use Imodium over-the-counter to slow down the diarrhea but not stop it. We'll place on Zantac 1 tablet 50 mg 3 times a day and Pepto-Bismol double strength 3 times a day with Zantac. Recommend treatment for the next 3-5 days and if  not better in 3 days follow-up with PCP or return here for reevaluation.  Note: This dictation was prepared with Dragon dictation along with smaller phrase technology. Any transcriptional errors that result from this process are unintentional.   Note: This dictation was prepared with Dragon dictation along with smaller phrase technology. Any transcriptional errors that result from this process are unintentional.    Hassan Rowan, MD 02/24/16 2137

## 2016-02-28 ENCOUNTER — Inpatient Hospital Stay
Admission: EM | Admit: 2016-02-28 | Discharge: 2016-03-08 | DRG: 743 | Disposition: A | Discharge status: Home / Self Care | Payer: Medicaid Other | Attending: Internal Medicine | Admitting: Internal Medicine

## 2016-02-28 ENCOUNTER — Emergency Department: Payer: Medicaid Other

## 2016-02-28 DIAGNOSIS — Z79899 Other long term (current) drug therapy: Secondary | ICD-10-CM | POA: Diagnosis not present

## 2016-02-28 DIAGNOSIS — F17210 Nicotine dependence, cigarettes, uncomplicated: Secondary | ICD-10-CM | POA: Diagnosis present

## 2016-02-28 DIAGNOSIS — K5900 Constipation, unspecified: Secondary | ICD-10-CM | POA: Diagnosis not present

## 2016-02-28 DIAGNOSIS — R1032 Left lower quadrant pain: Secondary | ICD-10-CM

## 2016-02-28 DIAGNOSIS — N83202 Unspecified ovarian cyst, left side: Secondary | ICD-10-CM | POA: Diagnosis present

## 2016-02-28 DIAGNOSIS — K529 Noninfective gastroenteritis and colitis, unspecified: Secondary | ICD-10-CM | POA: Diagnosis present

## 2016-02-28 DIAGNOSIS — M1990 Unspecified osteoarthritis, unspecified site: Secondary | ICD-10-CM | POA: Diagnosis present

## 2016-02-28 DIAGNOSIS — K210 Gastro-esophageal reflux disease with esophagitis: Secondary | ICD-10-CM | POA: Diagnosis present

## 2016-02-28 DIAGNOSIS — N83292 Other ovarian cyst, left side: Principal | ICD-10-CM | POA: Diagnosis present

## 2016-02-28 DIAGNOSIS — F418 Other specified anxiety disorders: Secondary | ICD-10-CM | POA: Diagnosis present

## 2016-02-28 DIAGNOSIS — G43909 Migraine, unspecified, not intractable, without status migrainosus: Secondary | ICD-10-CM | POA: Diagnosis present

## 2016-02-28 DIAGNOSIS — K219 Gastro-esophageal reflux disease without esophagitis: Secondary | ICD-10-CM | POA: Diagnosis present

## 2016-02-28 DIAGNOSIS — Z8249 Family history of ischemic heart disease and other diseases of the circulatory system: Secondary | ICD-10-CM

## 2016-02-28 DIAGNOSIS — K2970 Gastritis, unspecified, without bleeding: Secondary | ICD-10-CM | POA: Diagnosis present

## 2016-02-28 HISTORY — DX: Migraine, unspecified, not intractable, without status migrainosus: G43.909

## 2016-02-28 HISTORY — DX: Anxiety disorder, unspecified: F41.9

## 2016-02-28 HISTORY — DX: Gastro-esophageal reflux disease without esophagitis: K21.9

## 2016-02-28 LAB — URINALYSIS COMPLETE WITH MICROSCOPIC (ARMC ONLY)
Glucose, UA: NEGATIVE mg/dL
Hgb urine dipstick: NEGATIVE
Nitrite: NEGATIVE
Protein, ur: 100 mg/dL — AB
Specific Gravity, Urine: 1.039 — ABNORMAL HIGH (ref 1.005–1.030)
pH: 5 (ref 5.0–8.0)

## 2016-02-28 LAB — CBC
HCT: 42.9 % (ref 35.0–47.0)
Hemoglobin: 14.5 g/dL (ref 12.0–16.0)
MCH: 28.8 pg (ref 26.0–34.0)
MCHC: 33.7 g/dL (ref 32.0–36.0)
MCV: 85.5 fL (ref 80.0–100.0)
PLATELETS: 157 10*3/uL (ref 150–440)
RBC: 5.02 MIL/uL (ref 3.80–5.20)
RDW: 12.6 % (ref 11.5–14.5)
WBC: 11 10*3/uL (ref 3.6–11.0)

## 2016-02-28 LAB — COMPREHENSIVE METABOLIC PANEL
ALT: 11 U/L — ABNORMAL LOW (ref 14–54)
AST: 20 U/L (ref 15–41)
Albumin: 4.4 g/dL (ref 3.5–5.0)
Alkaline Phosphatase: 67 U/L (ref 38–126)
Anion gap: 7 (ref 5–15)
BUN: 17 mg/dL (ref 6–20)
CHLORIDE: 104 mmol/L (ref 101–111)
CO2: 26 mmol/L (ref 22–32)
Calcium: 9.6 mg/dL (ref 8.9–10.3)
Creatinine, Ser: 0.68 mg/dL (ref 0.44–1.00)
GFR calc non Af Amer: >60 mL/min (ref >60)
Glucose, Bld: 98 mg/dL (ref 65–99)
Potassium: 3.5 mmol/L (ref 3.5–5.1)
Sodium: 137 mmol/L (ref 135–145)
Total Bilirubin: 0.4 mg/dL (ref 0.3–1.2)
Total Protein: 7.3 g/dL (ref 6.5–8.1)

## 2016-02-28 LAB — POCT PREGNANCY, URINE: Preg Test, Ur: NEGATIVE

## 2016-02-28 LAB — LIPASE, BLOOD: LIPASE: 23 U/L (ref 11–51)

## 2016-02-28 MED ORDER — HYDROMORPHONE HCL 1 MG/ML IJ SOLN
1.0000 mg | Freq: Once | INTRAMUSCULAR | Status: AC
  Administered 2016-02-28: 1 mg via INTRAVENOUS

## 2016-02-28 MED ORDER — HYDROMORPHONE HCL 1 MG/ML IJ SOLN
INTRAMUSCULAR | Status: AC
  Administered 2016-02-28: 1 mg via INTRAVENOUS
  Filled 2016-02-28: qty 1

## 2016-02-28 MED ORDER — SODIUM CHLORIDE 0.9 % IV BOLUS (SEPSIS)
1000.0000 mL | Freq: Once | INTRAVENOUS | Status: AC
  Administered 2016-02-28: 1000 mL via INTRAVENOUS

## 2016-02-28 MED ORDER — CIPROFLOXACIN IN D5W 400 MG/200ML IV SOLN
INTRAVENOUS | Status: AC
  Administered 2016-02-28: 400 mg via INTRAVENOUS
  Filled 2016-02-28: qty 200

## 2016-02-28 MED ORDER — ONDANSETRON HCL 4 MG/2ML IJ SOLN
INTRAMUSCULAR | Status: AC
  Administered 2016-02-28: 4 mg via INTRAVENOUS
  Filled 2016-02-28: qty 2

## 2016-02-28 MED ORDER — METRONIDAZOLE 500 MG PO TABS
500.0000 mg | ORAL_TABLET | Freq: Once | ORAL | Status: AC
  Administered 2016-02-28: 500 mg via ORAL

## 2016-02-28 MED ORDER — ONDANSETRON HCL 4 MG/2ML IJ SOLN
4.0000 mg | Freq: Once | INTRAMUSCULAR | Status: AC
  Administered 2016-02-28: 4 mg via INTRAVENOUS

## 2016-02-28 MED ORDER — METHYLPREDNISOLONE SODIUM SUCC 125 MG IJ SOLR
125.0000 mg | Freq: Once | INTRAMUSCULAR | Status: AC
  Administered 2016-02-28: 125 mg via INTRAVENOUS
  Filled 2016-02-28: qty 2

## 2016-02-28 MED ORDER — MORPHINE SULFATE (PF) 4 MG/ML IV SOLN
INTRAVENOUS | Status: AC
  Administered 2016-02-28: 4 mg via INTRAVENOUS
  Filled 2016-02-28: qty 1

## 2016-02-28 MED ORDER — METRONIDAZOLE 500 MG PO TABS
ORAL_TABLET | ORAL | Status: AC
  Administered 2016-02-28: 500 mg via ORAL
  Filled 2016-02-28: qty 1

## 2016-02-28 MED ORDER — MORPHINE SULFATE (PF) 4 MG/ML IV SOLN
4.0000 mg | Freq: Once | INTRAVENOUS | Status: AC
  Administered 2016-02-28: 4 mg via INTRAVENOUS
  Filled 2016-02-28: qty 1

## 2016-02-28 MED ORDER — MORPHINE SULFATE (PF) 4 MG/ML IV SOLN
4.0000 mg | Freq: Once | INTRAVENOUS | Status: AC
  Administered 2016-02-28: 4 mg via INTRAVENOUS

## 2016-02-28 MED ORDER — NICOTINE 14 MG/24HR TD PT24
14.0000 mg | MEDICATED_PATCH | Freq: Once | TRANSDERMAL | Status: AC
  Administered 2016-02-28: 14 mg via TRANSDERMAL
  Filled 2016-02-28: qty 1

## 2016-02-28 MED ORDER — CIPROFLOXACIN IN D5W 400 MG/200ML IV SOLN
400.0000 mg | Freq: Once | INTRAVENOUS | Status: AC
  Administered 2016-02-28: 400 mg via INTRAVENOUS

## 2016-02-28 MED ORDER — IOPAMIDOL (ISOVUE-300) INJECTION 61%
100.0000 mL | Freq: Once | INTRAVENOUS | Status: AC | PRN
  Administered 2016-02-28: 100 mL via INTRAVENOUS
  Filled 2016-02-28: qty 100

## 2016-02-28 MED ORDER — DIATRIZOATE MEGLUMINE & SODIUM 66-10 % PO SOLN
15.0000 mL | Freq: Once | ORAL | Status: AC
  Administered 2016-02-28: 15 mL via ORAL

## 2016-02-28 MED ORDER — ONDANSETRON HCL 4 MG/2ML IJ SOLN
4.0000 mg | Freq: Once | INTRAMUSCULAR | Status: AC
  Administered 2016-02-28: 4 mg via INTRAVENOUS
  Filled 2016-02-28: qty 2

## 2016-02-28 NOTE — ED Notes (Signed)
Patient transported to Ultrasound 

## 2016-02-28 NOTE — ED Notes (Signed)
Pt come into the ED via ems from home with c/o lower abd pain and diarrhea with nausea since last Thursday and saw PCP, states she had been feeling better yesterday and today the pain worsened..Marland Kitchen

## 2016-02-28 NOTE — ED Provider Notes (Signed)
[facility] Emergency Department Provider Note  Time seen: 4:24 PM  I have reviewed the triage vital signs and the nursing notes.   HISTORY  Chief Complaint Abdominal Pain    HPI Vicki Dominguez is a 30 y.o. female with a past medical history of anxiety presents to the emergency department with diarrhea and left lower quadrant abdominal pain. According to the patient for the past 4-5 days she has been experiencing mild lower abdominal cramping with diarrhea. States nausea but denies any vomiting. Initially she also was complaining of a sore throat although this has improved her last few days. Patient was seen in urgent care 3 days ago for the same, prescribed Zofran for nausea. States the nausea has improved but she continues to have worsening left lower quadrant abdominal pain and continues to have diarrhea although states less frequently than it was occurring several days ago. Denies any black or bloody stool. Denies dysuria. Patient states approximately 3 or 4 weeks ago she had similar diarrhea however lasted a few days and went away. No family history of ulcerative colitis or Crohn's disease. Denies fever. Describes her left lower quadrant abdominal pain as sharp, moderate in severity and coming in waves/intermittent.     Past Medical History  Diagnosis Date  . Arthritis   . TMJ arthritis   . Anxiety     There are no active problems to display for this patient.   Past Surgical History  Procedure Laterality Date  . Temporomandibular joint surgery      Current Outpatient Rx  Name  Route  Sig  Dispense  Refill  . ALPRAZolam (XANAX) 1 MG tablet   Oral   Take 1 mg by mouth at bedtime as needed for anxiety.         . bismuth subsalicylate (PEPTO-BISMOL) 262 MG/15ML suspension      2 tablespoon or one double strength tablet 3 times a day with Zantac for the next 3-5 days   473 mL   0   . celecoxib (CELEBREX) 50 MG capsule   Oral   Take 50 mg by  mouth.         . ciprofloxacin (CIPRO) 500 MG tablet   Oral   Take 1 tablet (500 mg total) by mouth 2 (two) times daily. For 7 days   14 tablet   0   . ibuprofen (ADVIL,MOTRIN) 200 MG tablet   Oral   Take 200 mg by mouth every 6 (six) hours as needed.         . nitrofurantoin, macrocrystal-monohydrate, (MACROBID) 100 MG capsule   Oral   Take 1 capsule (100 mg total) by mouth 2 (two) times daily.   14 capsule   0   . ondansetron (ZOFRAN ODT) 8 MG disintegrating tablet   Oral   Take 1 tablet (8 mg total) by mouth every 8 (eight) hours as needed for nausea or vomiting.   20 tablet   0   . PARoxetine (PAXIL) 20 MG tablet   Oral   Take 20 mg by mouth daily.         . phenazopyridine (PYRIDIUM) 200 MG tablet   Oral   Take 1 tablet (200 mg total) by mouth 3 (three) times daily as needed for pain.   6 tablet   0   . promethazine (PHENERGAN) 25 MG tablet   Oral   Take 25 mg by mouth every 6 (six) hours as needed for nausea or vomiting.         Marland Kitchen  ranitidine (ZANTAC) 150 MG capsule      1 capsule 3 times a day for the next 3-5 days, take the Pepto-Bismol   15 capsule   0   . sertraline (ZOLOFT) 50 MG tablet   Oral   Take 100 mg by mouth daily.            Allergies Review of patient's allergies indicates no known allergies.  Family History  Problem Relation Age of Onset  . Heart attack Mother   . Hypertension Father     Social History Social History  Substance Use Topics  . Smoking status: Current Every Day Smoker -- 0.50 packs/day    Types: Cigarettes  . Smokeless tobacco: None  . Alcohol Use: 0.0 oz/week    0 Standard drinks or equivalent per week     Comment: occasionally    Review of Systems Constitutional: Negative for fever. Cardiovascular: Negative for chest pain. Respiratory: Negative for shortness of breath. Gastrointestinal: Sharp left lower quadrant abdominal pain. Positive nausea. Positive diarrhea. Negative for  vomiting. Genitourinary: Negative for dysuria. Musculoskeletal: Negative for back pain Neurological: Negative for headache 10-point ROS otherwise negative.  ____________________________________________   PHYSICAL EXAM:  VITAL SIGNS: ED Triage Vitals  Enc Vitals Group     BP 02/28/16 1404 101/77 mmHg     Pulse Rate 02/28/16 1404 127     Resp 02/28/16 1404 17     Temp 02/28/16 1404 98.2 F (36.8 C)     Temp Source 02/28/16 1404 Oral     SpO2 02/28/16 1404 100 %     Weight 02/28/16 1404 138 lb (62.596 kg)     Height 02/28/16 1404 5\' 2"  (1.575 m)     Head Cir --      Peak Flow --      Pain Score 02/28/16 1405 7     Pain Loc --      Pain Edu? --      Excl. in GC? --     Constitutional: Alert and oriented. Well appearing and in no distress. Eyes: Normal exam ENT   Head: Normocephalic and atraumatic.   Mouth/Throat: Mucous membranes are moist. Cardiovascular: Normal rate, regular rhythm. No murmur Respiratory: Normal respiratory effort without tachypnea nor retractions. Breath sounds are clear  Gastrointestinal: Soft, moderate left lower quadrant tenderness palpation. No rebound or guarding. No distention. Musculoskeletal: Nontender with normal range of motion in all extremities. Neurologic:  Normal speech and language. No gross focal neurologic deficits Skin:  Skin is warm, dry and intact.  Psychiatric: Mood and affect are normal.  ____________________________________________   RADIOLOGY  CT consistent with colitis 5 cm ovarian cyst.  ____________________________________________    INITIAL IMPRESSION / ASSESSMENT AND PLAN / ED COURSE  Pertinent labs & imaging results that were available during my care of the patient were reviewed by me and considered in my medical decision making (see chart for details).  Patient's labs are largely within normal limits. However the patient has moderate left lower quadrant tenderness along with diarrhea, we'll proceed with a  CT scan to rule out colitis. Patient states similar symptoms 2 or 3 weeks ago which resolved on the ground after several days. This would raise suspicion for possible IBD. No family history of IBD per patient. Denies any bleeding.  CT consistent with colitis. Incidental 5 cm ovarian cyst, we will order an ultrasound to further evaluate.  Ultrasound shows likely hemorrhagic cyst, no torsion. Patient continues with significant abdominal discomfort status post multiple rounds of  pain medication. Given the significant colitis we'll admit the patient for antibiotics and pain control. Patient with a similar episode several weeks ago, she is in the right age group we will cover him Solu-Medrol for possible inflammatory bowel disease.  ____________________________________________   FINAL CLINICAL IMPRESSION(S) / ED DIAGNOSES  Left lower quadrant abdominal pain Diarrhea   Minna Antis, MD 02/28/16 2157

## 2016-02-28 NOTE — H&P (Signed)
[facility] - [place] at [facility]   PATIENT NAME: Vicki Dominguez    MR#:  [id number]  DATE OF BIRTH:  November 07, 1985  DATE OF ADMISSION:  02/28/2016  PRIMARY CARE PHYSICIAN: [facility]   REQUESTING/REFERRING PHYSICIAN: Lenard Lance, MD  CHIEF COMPLAINT:   Chief Complaint  Patient presents with  . Abdominal Pain    HISTORY OF PRESENT ILLNESS:  Vicki Dominguez  is a 30 y.o. female who presents with Abdominal pain with diarrhea and then nausea and vomiting for the past 4 days. Patient states that she was seen initially in urgent care at the end of last week when she was having abdominal pain with diarrhea. She was told she had a viral illness and sent home. Her pain has continued to progress, and she came to the ED for evaluation today. Here she was found to have a colitis on CT scan, as well as a large ovarian cyst. Given her difficulty for pain control, hospitalists were called for admission for treatment of her colitis.  PAST MEDICAL HISTORY:   Past Medical History  Diagnosis Date  . Arthritis   . TMJ arthritis   . Anxiety   . GERD (gastroesophageal reflux disease)   . Migraine     PAST SURGICAL HISTORY:   Past Surgical History  Procedure Laterality Date  . Temporomandibular joint surgery      SOCIAL HISTORY:   Social History  Substance Use Topics  . Smoking status: Current Every Day Smoker -- 0.50 packs/day    Types: Cigarettes  . Smokeless tobacco: Not on file  . Alcohol Use: 0.0 oz/week    0 Standard drinks or equivalent per week     Comment: occasionally    FAMILY HISTORY:   Family History  Problem Relation Age of Onset  . Heart attack Mother   . Hypertension Father     DRUG ALLERGIES:  No Known Allergies  MEDICATIONS AT HOME:   Prior to Admission medications   Medication Sig Start Date End Date Taking? Authorizing Provider  ADDERALL XR 30 MG 24 hr capsule Take 30 mg by mouth every morning. 02/08/16  Yes Historical  Provider, MD  alprazolam Prudy Feeler) 2 MG tablet Take 2 mg by mouth 2 (two) times daily. 02/09/16  Yes Historical Provider, MD  aspirin-acetaminophen-caffeine (EXCEDRIN MIGRAINE) [phone] MG tablet Take 1-2 tablets by mouth every 6 (six) hours as needed for headache.   Yes Historical Provider, MD  bismuth subsalicylate (PEPTO-BISMOL) 262 MG/15ML suspension 2 tablespoon or one double strength tablet 3 times a day with Zantac for the next 3-5 days 02/24/16  Yes Hassan Rowan, MD  ibuprofen (ADVIL,MOTRIN) 200 MG tablet Take 200 mg by mouth every 6 (six) hours as needed.   Yes Historical Provider, MD  ondansetron (ZOFRAN ODT) 8 MG disintegrating tablet Take 1 tablet (8 mg total) by mouth every 8 (eight) hours as needed for nausea or vomiting. 02/24/16  Yes Hassan Rowan, MD  PARoxetine (PAXIL-CR) 25 MG 24 hr tablet Take 25 mg by mouth daily. 02/09/16  Yes Historical Provider, MD  ranitidine (ZANTAC) 150 MG capsule 1 capsule 3 times a day for the next 3-5 days, take the Pepto-Bismol 02/24/16  Yes Hassan Rowan, MD    REVIEW OF SYSTEMS:  Review of Systems  Constitutional: Negative for fever, chills, weight loss and malaise/fatigue.  HENT: Negative for ear pain, hearing loss and tinnitus.   Eyes: Negative for blurred vision, double vision, pain and redness.  Respiratory: Negative for cough, hemoptysis and  shortness of breath.   Cardiovascular: Negative for chest pain, palpitations, orthopnea and leg swelling.  Gastrointestinal: Positive for nausea, vomiting, abdominal pain and diarrhea. Negative for constipation.  Genitourinary: Negative for dysuria, frequency and hematuria.  Musculoskeletal: Negative for back pain, joint pain and neck pain.  Skin:       No acne, rash, or lesions  Neurological: Negative for dizziness, tremors, focal weakness and weakness.  Endo/Heme/Allergies: Negative for polydipsia. Does not bruise/bleed easily.  Psychiatric/Behavioral: Negative for depression. The patient is not nervous/anxious  and does not have insomnia.      VITAL SIGNS:   Filed Vitals:   02/28/16 1945 02/28/16 2032 02/28/16 2140 02/28/16 2227  BP: 113/88 114/83 114/74 111/75  Pulse: 98 99 97 97  Temp:      TempSrc:      Resp: 18 18 18 18   Height:      Weight:      SpO2: 100% 100% 100% 100%   Wt Readings from Last 3 Encounters:  02/28/16 62.596 kg (138 lb)  02/24/16 62.596 kg (138 lb)  05/26/15 68.04 kg (150 lb)    PHYSICAL EXAMINATION:  Physical Exam  Vitals reviewed. Constitutional: She is oriented to person, place, and time. She appears well-developed and well-nourished. No distress.  HENT:  Head: Normocephalic and atraumatic.  Mouth/Throat: Oropharynx is clear and moist.  Eyes: Conjunctivae and EOM are normal. Pupils are equal, round, and reactive to light. No scleral icterus.  Neck: Normal range of motion. Neck supple. No JVD present. No thyromegaly present.  Cardiovascular: Normal rate, regular rhythm and intact distal pulses.  Exam reveals no gallop and no friction rub.   No murmur heard. Respiratory: Effort normal and breath sounds normal. No respiratory distress. She has no wheezes. She has no rales.  GI: Soft. She exhibits no distension. There is tenderness.  Musculoskeletal: Normal range of motion. She exhibits no edema.  No arthritis, no gout  Lymphadenopathy:    She has no cervical adenopathy.  Neurological: She is alert and oriented to person, place, and time. No cranial nerve deficit.  No dysarthria, no aphasia  Skin: Skin is warm and dry. No rash noted. No erythema.  Psychiatric: She has a normal mood and affect. Her behavior is normal. Judgment and thought content normal.    LABORATORY PANEL:   CBC  Recent Labs Lab 02/28/16 1408  WBC 11.0  HGB 14.5  HCT 42.9  PLT 157   ------------------------------------------------------------------------------------------------------------------  Chemistries   Recent Labs Lab 02/28/16 1408  NA 137  K 3.5  CL 104  CO2  26  GLUCOSE 98  BUN 17  CREATININE 0.68  CALCIUM 9.6  AST 20  ALT 11*  ALKPHOS 67  BILITOT 0.4   ------------------------------------------------------------------------------------------------------------------  Cardiac Enzymes No results for input(s): TROPONINI in the last 168 hours. ------------------------------------------------------------------------------------------------------------------  RADIOLOGY:  US Transvaginal Non-ob  02/28/2016  CLINICAL DATA:  Left lower quadrant pain.  History of ovarian cysts. EXAM: TRANSABDOMINAL AND TRANSVAGINAL ULTRASOUND OF PELVIS DOPPLER ULTRASOUND OF OVARIES TECHNIQUE: Both transabdominal and transvaginal ultrasound examinations of the pelvis were performed. Transabdominal technique was performed for global imaging of the pelvis including uterus, ovaries, adnexal regions, and pelvic cul-de-sac. It was necessary to proceed with endovaginal exam following the transabdominal exam to visualize the ovaries and endometrium. Color and duplex Doppler ultrasound was utilized to evaluate blood flow to the ovaries. COMPARISON:  Abdominal CT from earlier today. FINDINGS: Uterus Measurements: 9 x 4 x 6 cm. No fibroids or other mass visualized. Endometrium  Thickness: 6 mm.  No focal abnormality visualized. Right ovary Measurements: 36 x 19 x 26 mm. Normal appearance/no adnexal mass. Left ovary Measurements: 50 x 44 x 15 mm. Asymmetric enlargement secondary to a 47 mm mass with no internal color Doppler flow and diffuse mid level echoes. No associated shadowing or bright spectral reflectors. Along the upper margin there may be superimposed fine septations. Pulsed Doppler evaluation of both ovaries demonstrates normal low-resistance arterial and venous waveforms. Other findings Small volume pelvic fluid which could be physiologic or reactive to patient's colitis. IMPRESSION: 1. No indication of ovarian torsion. 2. 5 cm left ovarian mass favoring endometrioma or  hemorrhagic cyst. Short-interval follow up ultrasound in 6-12 weeks is recommended, preferably during the week following the patient's normal menses. Electronically Signed   By: Marnee Spring M.D.   On: 02/28/2016 21:45   US Pelvis Complete  02/28/2016  CLINICAL DATA:  Left lower quadrant pain.  History of ovarian cysts. EXAM: TRANSABDOMINAL AND TRANSVAGINAL ULTRASOUND OF PELVIS DOPPLER ULTRASOUND OF OVARIES TECHNIQUE: Both transabdominal and transvaginal ultrasound examinations of the pelvis were performed. Transabdominal technique was performed for global imaging of the pelvis including uterus, ovaries, adnexal regions, and pelvic cul-de-sac. It was necessary to proceed with endovaginal exam following the transabdominal exam to visualize the ovaries and endometrium. Color and duplex Doppler ultrasound was utilized to evaluate blood flow to the ovaries. COMPARISON:  Abdominal CT from earlier today. FINDINGS: Uterus Measurements: 9 x 4 x 6 cm. No fibroids or other mass visualized. Endometrium Thickness: 6 mm.  No focal abnormality visualized. Right ovary Measurements: 36 x 19 x 26 mm. Normal appearance/no adnexal mass. Left ovary Measurements: 50 x 44 x 15 mm. Asymmetric enlargement secondary to a 47 mm mass with no internal color Doppler flow and diffuse mid level echoes. No associated shadowing or bright spectral reflectors. Along the upper margin there may be superimposed fine septations. Pulsed Doppler evaluation of both ovaries demonstrates normal low-resistance arterial and venous waveforms. Other findings Small volume pelvic fluid which could be physiologic or reactive to patient's colitis. IMPRESSION: 1. No indication of ovarian torsion. 2. 5 cm left ovarian mass favoring endometrioma or hemorrhagic cyst. Short-interval follow up ultrasound in 6-12 weeks is recommended, preferably during the week following the patient's normal menses. Electronically Signed   By: Marnee Spring M.D.   On: 02/28/2016  21:45   Ct Abdomen Pelvis W Contrast  02/28/2016  CLINICAL DATA:  Abdominal pain with diarrhea. Left lower quadrant tenderness. EXAM: CT ABDOMEN AND PELVIS WITH CONTRAST TECHNIQUE: Multidetector CT imaging of the abdomen and pelvis was performed using the standard protocol following bolus administration of intravenous contrast. CONTRAST:  ISOVUE-300 IOPAMIDOL (ISOVUE-300) INJECTION 61% COMPARISON:  None. FINDINGS: Normal lung bases. No free air. There is free fluid in the pelvis. There is colonic wall thickening and pericolonic stranding associated with the descending colon. Proximal to the abnormal colon is moderate to severe fecal loading. The appendix is normal. The stomach and small bowel are normal. Liver, gallbladder, spleen, adrenal glands, pancreas, and kidneys are normal. The abdominal aorta and its branching vessels are normal. No atherosclerotic change, aneurysm, or dissection. There is free fluid in the pelvis. The uterus and right ovary are normal. There is a large complicated cyst in the left ovary measuring up to 5.3 cm with a probable enhancing septation. There also appears to be a small amount of enhancement along the periphery. No adenopathy. No other abnormalities in the pelvis. Visualized bones are unremarkable. IMPRESSION:  1. Colitis involving the descending colon could be infectious, inflammatory, or ischemic. 2. Complicated cyst measuring greater than 5 cm in the left ovary with adjacent fluid and peripheral enhancement. An ultrasound could better evaluate. If there is any concern for torsion, recommend Doppler images be included with the pelvic ultrasound. Electronically Signed   By: Gerome Sam III M.D   On: 02/28/2016 19:25   [place] Art/ven Flow Abd Pelv Doppler  02/28/2016  CLINICAL DATA:  Left lower quadrant pain.  History of ovarian cysts. EXAM: TRANSABDOMINAL AND TRANSVAGINAL ULTRASOUND OF PELVIS DOPPLER ULTRASOUND OF OVARIES TECHNIQUE: Both transabdominal and transvaginal  ultrasound examinations of the pelvis were performed. Transabdominal technique was performed for global imaging of the pelvis including uterus, ovaries, adnexal regions, and pelvic cul-de-sac. It was necessary to proceed with endovaginal exam following the transabdominal exam to visualize the ovaries and endometrium. Color and duplex Doppler ultrasound was utilized to evaluate blood flow to the ovaries. COMPARISON:  Abdominal CT from earlier today. FINDINGS: Uterus Measurements: 9 x 4 x 6 cm. No fibroids or other mass visualized. Endometrium Thickness: 6 mm.  No focal abnormality visualized. Right ovary Measurements: 36 x 19 x 26 mm. Normal appearance/no adnexal mass. Left ovary Measurements: 50 x 44 x 15 mm. Asymmetric enlargement secondary to a 47 mm mass with no internal color Doppler flow and diffuse mid level echoes. No associated shadowing or bright spectral reflectors. Along the upper margin there may be superimposed fine septations. Pulsed Doppler evaluation of both ovaries demonstrates normal low-resistance arterial and venous waveforms. Other findings Small volume pelvic fluid which could be physiologic or reactive to patient's colitis. IMPRESSION: 1. No indication of ovarian torsion. 2. 5 cm left ovarian mass favoring endometrioma or hemorrhagic cyst. Short-interval follow up ultrasound in 6-12 weeks is recommended, preferably during the week following the patient's normal menses. Electronically Signed   By: Marnee Spring M.D.   On: 02/28/2016 21:45    EKG:   Orders placed or performed in visit on 05/03/08  . EKG 12-Lead    IMPRESSION AND PLAN:  Principal Problem:   Acute colitis - antibiotics given in the ED, we will also use when necessary pain control and antiemetics. Patient will be nothing by mouth for now. Patient does also have a large ovarian cyst which may be contributing some to her abdominal discomfort as well. Active Problems:   Anxiety - home dose anxiolytic   GERD  (gastroesophageal reflux disease) - home dose H2 blocker   Migraine - treat when necessary  All the records are reviewed and case discussed with ED provider. Management plans discussed with the patient and/or family.  DVT PROPHYLAXIS: SubQ lovenox  GI PROPHYLAXIS: H2 blocker  ADMISSION STATUS: Inpatient  CODE STATUS: Full Code Status History    This patient does not have a recorded code status. Please follow your organizational policy for patients in this situation.      TOTAL TIME TAKING CARE OF THIS PATIENT: 45 minutes.    Vicki Dominguez FIELDING 02/28/2016, 10:38 PM  [facility]  Office  [phone]  CC: Primary care physician; [facility]

## 2016-02-29 LAB — BASIC METABOLIC PANEL
Anion gap: 7 (ref 5–15)
BUN: 10 mg/dL (ref 6–20)
CHLORIDE: 107 mmol/L (ref 101–111)
CO2: 23 mmol/L (ref 22–32)
Calcium: 8.6 mg/dL — ABNORMAL LOW (ref 8.9–10.3)
Creatinine, Ser: 0.52 mg/dL (ref 0.44–1.00)
GFR calc non Af Amer: >60 mL/min (ref >60)
GLUCOSE: 154 mg/dL — AB (ref 65–99)
POTASSIUM: 4 mmol/L (ref 3.5–5.1)
Sodium: 137 mmol/L (ref 135–145)

## 2016-02-29 LAB — URINALYSIS COMPLETE WITH MICROSCOPIC (ARMC ONLY)
Bilirubin Urine: NEGATIVE
Glucose, UA: NEGATIVE mg/dL
Hgb urine dipstick: NEGATIVE
Nitrite: NEGATIVE
Protein, ur: NEGATIVE mg/dL
Specific Gravity, Urine: 1.015 (ref 1.005–1.030)
pH: 5 (ref 5.0–8.0)

## 2016-02-29 LAB — CBC
HEMATOCRIT: 40 % (ref 35.0–47.0)
HEMOGLOBIN: 13.5 g/dL (ref 12.0–16.0)
MCH: 28.8 pg (ref 26.0–34.0)
MCHC: 33.7 g/dL (ref 32.0–36.0)
MCV: 85.5 fL (ref 80.0–100.0)
Platelets: 144 10*3/uL — ABNORMAL LOW (ref 150–440)
RBC: 4.68 MIL/uL (ref 3.80–5.20)
RDW: 12.6 % (ref 11.5–14.5)
WBC: 8.6 10*3/uL (ref 3.6–11.0)

## 2016-02-29 MED ORDER — METRONIDAZOLE 500 MG PO TABS
500.0000 mg | ORAL_TABLET | Freq: Three times a day (TID) | ORAL | Status: DC
  Administered 2016-02-29: 500 mg via ORAL
  Filled 2016-02-29 (×2): qty 1

## 2016-02-29 MED ORDER — ONDANSETRON HCL 4 MG/2ML IJ SOLN
4.0000 mg | Freq: Four times a day (QID) | INTRAMUSCULAR | Status: DC | PRN
  Administered 2016-02-29 – 2016-03-01 (×5): 4 mg via INTRAVENOUS
  Filled 2016-02-29 (×5): qty 2

## 2016-02-29 MED ORDER — SODIUM CHLORIDE 0.9 % IV SOLN
INTRAVENOUS | Status: DC
  Administered 2016-02-29: 01:00:00 via INTRAVENOUS

## 2016-02-29 MED ORDER — PAROXETINE HCL ER 12.5 MG PO TB24
25.0000 mg | ORAL_TABLET | Freq: Every day | ORAL | Status: DC
  Administered 2016-02-29 – 2016-03-08 (×9): 25 mg via ORAL
  Filled 2016-02-29 (×4): qty 2
  Filled 2016-02-29: qty 1
  Filled 2016-02-29 (×5): qty 2

## 2016-02-29 MED ORDER — ACETAMINOPHEN 325 MG PO TABS
650.0000 mg | ORAL_TABLET | Freq: Four times a day (QID) | ORAL | Status: DC | PRN
  Administered 2016-03-01 – 2016-03-08 (×7): 650 mg via ORAL
  Filled 2016-02-29 (×6): qty 2

## 2016-02-29 MED ORDER — HYDROMORPHONE HCL 1 MG/ML IJ SOLN
1.0000 mg | INTRAMUSCULAR | Status: DC | PRN
  Administered 2016-02-29 – 2016-03-02 (×12): 1 mg via INTRAVENOUS
  Filled 2016-02-29 (×12): qty 1

## 2016-02-29 MED ORDER — ACETAMINOPHEN 650 MG RE SUPP: 650.0000 mg | Freq: Four times a day (QID) | RECTAL | Status: DC | PRN

## 2016-02-29 MED ORDER — NICOTINE 21 MG/24HR TD PT24
21.0000 mg | MEDICATED_PATCH | Freq: Every day | TRANSDERMAL | Status: DC
  Administered 2016-02-29 – 2016-03-08 (×8): 21 mg via TRANSDERMAL
  Filled 2016-02-29 (×9): qty 1

## 2016-02-29 MED ORDER — ALPRAZOLAM 1 MG PO TABS
2.0000 mg | ORAL_TABLET | Freq: Two times a day (BID) | ORAL | Status: DC
  Administered 2016-02-29 – 2016-03-08 (×18): 2 mg via ORAL
  Filled 2016-02-29 (×4): qty 2
  Filled 2016-02-29: qty 4
  Filled 2016-02-29 (×4): qty 2
  Filled 2016-02-29: qty 1
  Filled 2016-02-29 (×5): qty 2
  Filled 2016-02-29: qty 1
  Filled 2016-02-29 (×3): qty 2

## 2016-02-29 MED ORDER — SODIUM CHLORIDE 0.9 % IV SOLN
INTRAVENOUS | Status: DC
  Administered 2016-02-29 – 2016-03-03 (×6): via INTRAVENOUS

## 2016-02-29 MED ORDER — METRONIDAZOLE IN NACL 5-0.79 MG/ML-% IV SOLN
500.0000 mg | Freq: Three times a day (TID) | INTRAVENOUS | Status: DC
  Administered 2016-02-29 – 2016-03-02 (×6): 500 mg via INTRAVENOUS
  Filled 2016-02-29 (×10): qty 100

## 2016-02-29 MED ORDER — FAMOTIDINE 20 MG PO TABS
20.0000 mg | ORAL_TABLET | Freq: Two times a day (BID) | ORAL | Status: DC
  Administered 2016-02-29 – 2016-03-03 (×9): 20 mg via ORAL
  Filled 2016-02-29 (×9): qty 1

## 2016-02-29 MED ORDER — ENOXAPARIN SODIUM 40 MG/0.4ML ~~LOC~~ SOLN
40.0000 mg | SUBCUTANEOUS | Status: DC
  Administered 2016-02-29 – 2016-03-04 (×6): 40 mg via SUBCUTANEOUS
  Filled 2016-02-29 (×6): qty 0.4

## 2016-02-29 MED ORDER — ONDANSETRON HCL 4 MG PO TABS: 4.0000 mg | ORAL_TABLET | Freq: Four times a day (QID) | ORAL | Status: DC | PRN

## 2016-02-29 MED ORDER — HYDROMORPHONE HCL 1 MG/ML IJ SOLN
0.5000 mg | INTRAMUSCULAR | Status: DC | PRN
  Administered 2016-02-29: 0.5 mg via INTRAVENOUS
  Filled 2016-02-29: qty 1

## 2016-02-29 MED ORDER — CIPROFLOXACIN IN D5W 400 MG/200ML IV SOLN
400.0000 mg | Freq: Two times a day (BID) | INTRAVENOUS | Status: DC
Start: 2016-02-29 — End: 2016-03-02
  Administered 2016-02-29 – 2016-03-02 (×5): 400 mg via INTRAVENOUS
  Filled 2016-02-29 (×7): qty 200

## 2016-02-29 MED ORDER — AMPHETAMINE-DEXTROAMPHET ER 30 MG PO CP24
30.0000 mg | ORAL_CAPSULE | Freq: Every day | ORAL | Status: DC
  Administered 2016-02-29 – 2016-03-08 (×9): 30 mg via ORAL
  Filled 2016-02-29 (×9): qty 1

## 2016-02-29 MED ORDER — OXYCODONE HCL 5 MG PO TABS
5.0000 mg | ORAL_TABLET | ORAL | Status: DC | PRN
  Administered 2016-02-29 – 2016-03-05 (×15): 5 mg via ORAL
  Filled 2016-02-29 (×11): qty 1
  Filled 2016-02-29: qty 2
  Filled 2016-02-29 (×5): qty 1

## 2016-02-29 NOTE — Plan of Care (Signed)
Problem: Pain Managment: Goal: General experience of comfort will improve Outcome: Not Progressing Patient with complaints of pain of 8-9/10.  Oxy given - with pain reducing to 7/10 at its lowest.

## 2016-02-29 NOTE — Progress Notes (Signed)
[facility] - [place] at [facility]   PATIENT NAME: Vicki Dominguez    MRN#:  [id number]  DATE OF BIRTH:  1986/03/01  SUBJECTIVE:  Hospital Day: 1 day Vicki Dominguez is a 30 y.o. female presenting with Abdominal Pain .   Overnight events: No overnight events Interval Events: Still complains of abdominal pain somewhat improved  REVIEW OF SYSTEMS:  CONSTITUTIONAL: No fever, fatigue or weakness.  EYES: No blurred or double vision.  EARS, NOSE, AND THROAT: No tinnitus or ear pain.  RESPIRATORY: No cough, shortness of breath, wheezing or hemoptysis.  CARDIOVASCULAR: No chest pain, orthopnea, edema.  GASTROINTESTINAL: No nausea, vomiting, diarrhea -positive abdominal pain.  GENITOURINARY: No dysuria, hematuria.  ENDOCRINE: No polyuria, nocturia,  HEMATOLOGY: No anemia, easy bruising or bleeding SKIN: No rash or lesion. MUSCULOSKELETAL: No joint pain or arthritis.   NEUROLOGIC: No tingling, numbness, weakness.  PSYCHIATRY: No anxiety or depression.   DRUG ALLERGIES:  No Known Allergies  VITALS:  Blood pressure 95/56, pulse 84, temperature 97.7 F (36.5 C), temperature source Oral, resp. rate 20, height 5\' 2"  (1.575 m), weight 148 lb 9.6 oz (67.405 kg), last menstrual period 02/03/2016, SpO2 98 %.  PHYSICAL EXAMINATION:  VITAL SIGNS: Filed Vitals:   02/29/16 0432 02/29/16 1304  BP: 91/67 95/56  Pulse: 92 84  Temp: 97.5 F (36.4 C) 97.7 F (36.5 C)  Resp: 18 20   GENERAL:29 y.o.female currently in no acute distress.  HEAD: Normocephalic, atraumatic.  EYES: Pupils equal, round, reactive to light. Extraocular muscles intact. No scleral icterus.  MOUTH: Moist mucosal membrane. Dentition intact. No abscess noted.  EAR, NOSE, THROAT: Clear without exudates. No external lesions.  NECK: Supple. No thyromegaly. No nodules. No JVD.  PULMONARY: Clear to ascultation, without wheeze rails or rhonci. No use of accessory muscles, Good respiratory effort. good air  entry bilaterally CHEST: Nontender to palpation.  CARDIOVASCULAR: S1 and S2. Regular rate and rhythm. No murmurs, rubs, or gallops. No edema. Pedal pulses 2+ bilaterally.  GASTROINTESTINAL: Soft, tenderness to palpation left lower quadrant without rebound or guarding, nondistended. No masses. Positive bowel sounds. No hepatosplenomegaly.  MUSCULOSKELETAL: No swelling, clubbing, or edema. Range of motion full in all extremities.  NEUROLOGIC: Cranial nerves II through XII are intact. No gross focal neurological deficits. Sensation intact. Reflexes intact.  SKIN: No ulceration, lesions, rashes, or cyanosis. Skin warm and dry. Turgor intact.  PSYCHIATRIC: Mood, affect within normal limits. The patient is awake, alert and oriented x 3. Insight, judgment intact.      LABORATORY PANEL:   CBC  Recent Labs Lab 02/29/16 0617  WBC 8.6  HGB 13.5  HCT 40.0  PLT 144*   ------------------------------------------------------------------------------------------------------------------  Chemistries   Recent Labs Lab 02/28/16 1408 02/29/16 0617  NA 137 137  K 3.5 4.0  CL 104 107  CO2 26 23  GLUCOSE 98 154*  BUN 17 10  CREATININE 0.68 0.52  CALCIUM 9.6 8.6*  AST 20  --   ALT 11*  --   ALKPHOS 67  --   BILITOT 0.4  --    ------------------------------------------------------------------------------------------------------------------  Cardiac Enzymes No results for input(s): TROPONINI in the last 168 hours. ------------------------------------------------------------------------------------------------------------------  RADIOLOGY:  US Transvaginal Non-ob  02/28/2016  CLINICAL DATA:  Left lower quadrant pain.  History of ovarian cysts. EXAM: TRANSABDOMINAL AND TRANSVAGINAL ULTRASOUND OF PELVIS DOPPLER ULTRASOUND OF OVARIES TECHNIQUE: Both transabdominal and transvaginal ultrasound examinations of the pelvis were performed. Transabdominal technique was performed for global imaging of  the pelvis including  uterus, ovaries, adnexal regions, and pelvic cul-de-sac. It was necessary to proceed with endovaginal exam following the transabdominal exam to visualize the ovaries and endometrium. Color and duplex Doppler ultrasound was utilized to evaluate blood flow to the ovaries. COMPARISON:  Abdominal CT from earlier today. FINDINGS: Uterus Measurements: 9 x 4 x 6 cm. No fibroids or other mass visualized. Endometrium Thickness: 6 mm.  No focal abnormality visualized. Right ovary Measurements: 36 x 19 x 26 mm. Normal appearance/no adnexal mass. Left ovary Measurements: 50 x 44 x 15 mm. Asymmetric enlargement secondary to a 47 mm mass with no internal color Doppler flow and diffuse mid level echoes. No associated shadowing or bright spectral reflectors. Along the upper margin there may be superimposed fine septations. Pulsed Doppler evaluation of both ovaries demonstrates normal low-resistance arterial and venous waveforms. Other findings Small volume pelvic fluid which could be physiologic or reactive to patient's colitis. IMPRESSION: 1. No indication of ovarian torsion. 2. 5 cm left ovarian mass favoring endometrioma or hemorrhagic cyst. Short-interval follow up ultrasound in 6-12 weeks is recommended, preferably during the week following the patient's normal menses. Electronically Signed   By: Marnee SpringJonathon  Watts M.D.   On: 02/28/2016 21:45   [place] Pelvis Complete  02/28/2016  CLINICAL DATA:  Left lower quadrant pain.  History of ovarian cysts. EXAM: TRANSABDOMINAL AND TRANSVAGINAL ULTRASOUND OF PELVIS DOPPLER ULTRASOUND OF OVARIES TECHNIQUE: Both transabdominal and transvaginal ultrasound examinations of the pelvis were performed. Transabdominal technique was performed for global imaging of the pelvis including uterus, ovaries, adnexal regions, and pelvic cul-de-sac. It was necessary to proceed with endovaginal exam following the transabdominal exam to visualize the ovaries and endometrium. Color and duplex  Doppler ultrasound was utilized to evaluate blood flow to the ovaries. COMPARISON:  Abdominal CT from earlier today. FINDINGS: Uterus Measurements: 9 x 4 x 6 cm. No fibroids or other mass visualized. Endometrium Thickness: 6 mm.  No focal abnormality visualized. Right ovary Measurements: 36 x 19 x 26 mm. Normal appearance/no adnexal mass. Left ovary Measurements: 50 x 44 x 15 mm. Asymmetric enlargement secondary to a 47 mm mass with no internal color Doppler flow and diffuse mid level echoes. No associated shadowing or bright spectral reflectors. Along the upper margin there may be superimposed fine septations. Pulsed Doppler evaluation of both ovaries demonstrates normal low-resistance arterial and venous waveforms. Other findings Small volume pelvic fluid which could be physiologic or reactive to patient's colitis. IMPRESSION: 1. No indication of ovarian torsion. 2. 5 cm left ovarian mass favoring endometrioma or hemorrhagic cyst. Short-interval follow up ultrasound in 6-12 weeks is recommended, preferably during the week following the patient's normal menses. Electronically Signed   By: Marnee SpringJonathon  Watts M.D.   On: 02/28/2016 21:45   Ct Abdomen Pelvis W Contrast  02/28/2016  CLINICAL DATA:  Abdominal pain with diarrhea. Left lower quadrant tenderness. EXAM: CT ABDOMEN AND PELVIS WITH CONTRAST TECHNIQUE: Multidetector CT imaging of the abdomen and pelvis was performed using the standard protocol following bolus administration of intravenous contrast. CONTRAST:  100mL ISOVUE-300 IOPAMIDOL (ISOVUE-300) INJECTION 61% COMPARISON:  None. FINDINGS: Normal lung bases. No free air. There is free fluid in the pelvis. There is colonic wall thickening and pericolonic stranding associated with the descending colon. Proximal to the abnormal colon is moderate to severe fecal loading. The appendix is normal. The stomach and small bowel are normal. Liver, gallbladder, spleen, adrenal glands, pancreas, and kidneys are normal. The  abdominal aorta and its branching vessels are normal. No atherosclerotic change, aneurysm,  or dissection. There is free fluid in the pelvis. The uterus and right ovary are normal. There is a large complicated cyst in the left ovary measuring up to 5.3 cm with a probable enhancing septation. There also appears to be a small amount of enhancement along the periphery. No adenopathy. No other abnormalities in the pelvis. Visualized bones are unremarkable. IMPRESSION: 1. Colitis involving the descending colon could be infectious, inflammatory, or ischemic. 2. Complicated cyst measuring greater than 5 cm in the left ovary with adjacent fluid and peripheral enhancement. An ultrasound could better evaluate. If there is any concern for torsion, recommend Doppler images be included with the pelvic ultrasound. Electronically Signed   By: Gerome Sam III M.D   On: 02/28/2016 19:25   [place] Art/ven Flow Abd Pelv Doppler  02/28/2016  CLINICAL DATA:  Left lower quadrant pain.  History of ovarian cysts. EXAM: TRANSABDOMINAL AND TRANSVAGINAL ULTRASOUND OF PELVIS DOPPLER ULTRASOUND OF OVARIES TECHNIQUE: Both transabdominal and transvaginal ultrasound examinations of the pelvis were performed. Transabdominal technique was performed for global imaging of the pelvis including uterus, ovaries, adnexal regions, and pelvic cul-de-sac. It was necessary to proceed with endovaginal exam following the transabdominal exam to visualize the ovaries and endometrium. Color and duplex Doppler ultrasound was utilized to evaluate blood flow to the ovaries. COMPARISON:  Abdominal CT from earlier today. FINDINGS: Uterus Measurements: 9 x 4 x 6 cm. No fibroids or other mass visualized. Endometrium Thickness: 6 mm.  No focal abnormality visualized. Right ovary Measurements: 36 x 19 x 26 mm. Normal appearance/no adnexal mass. Left ovary Measurements: 50 x 44 x 15 mm. Asymmetric enlargement secondary to a 47 mm mass with no internal color Doppler flow and  diffuse mid level echoes. No associated shadowing or bright spectral reflectors. Along the upper margin there may be superimposed fine septations. Pulsed Doppler evaluation of both ovaries demonstrates normal low-resistance arterial and venous waveforms. Other findings Small volume pelvic fluid which could be physiologic or reactive to patient's colitis. IMPRESSION: 1. No indication of ovarian torsion. 2. 5 cm left ovarian mass favoring endometrioma or hemorrhagic cyst. Short-interval follow up ultrasound in 6-12 weeks is recommended, preferably during the week following the patient's normal menses. Electronically Signed   By: Marnee Spring M.D.   On: 02/28/2016 21:45    EKG:   Orders placed or performed in visit on 05/03/08  . EKG 12-Lead    ASSESSMENT AND PLAN:   Vicki Dominguez is a 30 y.o. female presenting with Abdominal Pain . Admitted 02/28/2016 : Day #: 1 day 1. Acute colitis continue Cipro and Flagyl advance diet as tolerated will start her on clears 2. GERD without esophagitis Zantac   All the records are reviewed and case discussed with Care Management/Social Workerr. Management plans discussed with the patient, family and they are in agreement.  CODE STATUS: full TOTAL TIME TAKING CARE OF THIS PATIENT: 28 minutes.   POSSIBLE D/C IN 1DAYS, DEPENDING ON CLINICAL CONDITION.   Hower,  Mardi Mainland.D on 02/29/2016 at 1:33 PM  Between 7am to 6pm - Pager - [phone]  After 6pm: House Pager: - [phone]  [facility]  Office  [phone]  CC: Primary care physician; [facility] PRIMARY CARE

## 2016-03-01 LAB — URINE CULTURE: Culture: NO GROWTH

## 2016-03-01 MED ORDER — GUAIFENESIN-DM 100-10 MG/5ML PO SYRP
5.0000 mL | ORAL_SOLUTION | ORAL | Status: DC | PRN
Start: 2016-03-01 — End: 2016-03-08
  Administered 2016-03-01 – 2016-03-04 (×2): 5 mL via ORAL
  Filled 2016-03-01 (×2): qty 5

## 2016-03-01 MED ORDER — ONDANSETRON HCL 4 MG PO TABS
4.0000 mg | ORAL_TABLET | ORAL | Status: DC | PRN
  Administered 2016-03-03 – 2016-03-07 (×18): 4 mg via ORAL
  Filled 2016-03-01 (×19): qty 1

## 2016-03-01 MED ORDER — ONDANSETRON HCL 4 MG/2ML IJ SOLN
4.0000 mg | INTRAMUSCULAR | Status: DC | PRN
  Administered 2016-03-01 – 2016-03-03 (×10): 4 mg via INTRAVENOUS
  Filled 2016-03-01 (×10): qty 2

## 2016-03-01 MED ORDER — BOOST / RESOURCE BREEZE PO LIQD
1.0000 | Freq: Two times a day (BID) | ORAL | Status: DC
  Administered 2016-03-01 – 2016-03-05 (×8): 1 via ORAL

## 2016-03-01 NOTE — Progress Notes (Signed)
[facility] - [place] at [facility]   PATIENT NAME: Vicki Dominguez    MRN#:  [id number]  DATE OF BIRTH:  24-Jun-1986  SUBJECTIVE:  Hospital Day: 2 days Vicki Dominguez is a 30 y.o. female presenting with Abdominal Pain .   Overnight events: No overnight events Interval Events: Still complains of abdominal pain somewhat improved  REVIEW OF SYSTEMS:  CONSTITUTIONAL: No fever, fatigue or weakness.  EYES: No blurred or double vision.  EARS, NOSE, AND THROAT: No tinnitus or ear pain.  RESPIRATORY: No cough, shortness of breath, wheezing or hemoptysis.  CARDIOVASCULAR: No chest pain, orthopnea, edema.  GASTROINTESTINAL: No nausea, vomiting, diarrhea -positive abdominal pain.  GENITOURINARY: No dysuria, hematuria.  ENDOCRINE: No polyuria, nocturia,  HEMATOLOGY: No anemia, easy bruising or bleeding SKIN: No rash or lesion. MUSCULOSKELETAL: No joint pain or arthritis.   NEUROLOGIC: No tingling, numbness, weakness.  PSYCHIATRY: No anxiety or depression.   DRUG ALLERGIES:  No Known Allergies  VITALS:  Blood pressure 102/74, pulse 89, temperature 98 F (36.7 C), temperature source Oral, resp. rate 18, height 5\' 2"  (1.575 m), weight 148 lb 9.6 oz (67.405 kg), last menstrual period 02/03/2016, SpO2 100 %.  PHYSICAL EXAMINATION:  VITAL SIGNS: Filed Vitals:   03/01/16 0440 03/01/16 0749  BP: 100/62 102/74  Pulse: 87 89  Temp:  98 F (36.7 C)  Resp:  18   GENERAL:29 y.o.female currently in no acute distress.  HEAD: Normocephalic, atraumatic.  EYES: Pupils equal, round, reactive to light. Extraocular muscles intact. No scleral icterus.  MOUTH: Moist mucosal membrane. Dentition intact. No abscess noted.  EAR, NOSE, THROAT: Clear without exudates. No external lesions.  NECK: Supple. No thyromegaly. No nodules. No JVD.  PULMONARY: Clear to ascultation, without wheeze rails or rhonci. No use of accessory muscles, Good respiratory effort. good air entry  bilaterally CHEST: Nontender to palpation.  CARDIOVASCULAR: S1 and S2. Regular rate and rhythm. No murmurs, rubs, or gallops. No edema. Pedal pulses 2+ bilaterally.  GASTROINTESTINAL: Soft, tenderness to palpation left lower quadrant without rebound or guarding, nondistended. No masses. Positive bowel sounds. No hepatosplenomegaly.  MUSCULOSKELETAL: No swelling, clubbing, or edema. Range of motion full in all extremities.  NEUROLOGIC: Cranial nerves II through XII are intact. No gross focal neurological deficits. Sensation intact. Reflexes intact.  SKIN: No ulceration, lesions, rashes, or cyanosis. Skin warm and dry. Turgor intact.  PSYCHIATRIC: Mood, affect within normal limits. The patient is awake, alert and oriented x 3. Insight, judgment intact.      LABORATORY PANEL:   CBC  Recent Labs Lab 02/29/16 0617  WBC 8.6  HGB 13.5  HCT 40.0  PLT 144*   ------------------------------------------------------------------------------------------------------------------  Chemistries   Recent Labs Lab 02/28/16 1408 02/29/16 0617  NA 137 137  K 3.5 4.0  CL 104 107  CO2 26 23  GLUCOSE 98 154*  BUN 17 10  CREATININE 0.68 0.52  CALCIUM 9.6 8.6*  AST 20  --   ALT 11*  --   ALKPHOS 67  --   BILITOT 0.4  --    ------------------------------------------------------------------------------------------------------------------  Cardiac Enzymes No results for input(s): TROPONINI in the last 168 hours. ------------------------------------------------------------------------------------------------------------------  RADIOLOGY:  US Transvaginal Non-ob  02/28/2016  CLINICAL DATA:  Left lower quadrant pain.  History of ovarian cysts. EXAM: TRANSABDOMINAL AND TRANSVAGINAL ULTRASOUND OF PELVIS DOPPLER ULTRASOUND OF OVARIES TECHNIQUE: Both transabdominal and transvaginal ultrasound examinations of the pelvis were performed. Transabdominal technique was performed for global imaging of the  pelvis including uterus, ovaries, adnexal  regions, and pelvic cul-de-sac. It was necessary to proceed with endovaginal exam following the transabdominal exam to visualize the ovaries and endometrium. Color and duplex Doppler ultrasound was utilized to evaluate blood flow to the ovaries. COMPARISON:  Abdominal CT from earlier today. FINDINGS: Uterus Measurements: 9 x 4 x 6 cm. No fibroids or other mass visualized. Endometrium Thickness: 6 mm.  No focal abnormality visualized. Right ovary Measurements: 36 x 19 x 26 mm. Normal appearance/no adnexal mass. Left ovary Measurements: 50 x 44 x 15 mm. Asymmetric enlargement secondary to a 47 mm mass with no internal color Doppler flow and diffuse mid level echoes. No associated shadowing or bright spectral reflectors. Along the upper margin there may be superimposed fine septations. Pulsed Doppler evaluation of both ovaries demonstrates normal low-resistance arterial and venous waveforms. Other findings Small volume pelvic fluid which could be physiologic or reactive to patient's colitis. IMPRESSION: 1. No indication of ovarian torsion. 2. 5 cm left ovarian mass favoring endometrioma or hemorrhagic cyst. Short-interval follow up ultrasound in 6-12 weeks is recommended, preferably during the week following the patient's normal menses. Electronically Signed   By: Marnee Spring M.D.   On: 02/28/2016 21:45   [place] Pelvis Complete  02/28/2016  CLINICAL DATA:  Left lower quadrant pain.  History of ovarian cysts. EXAM: TRANSABDOMINAL AND TRANSVAGINAL ULTRASOUND OF PELVIS DOPPLER ULTRASOUND OF OVARIES TECHNIQUE: Both transabdominal and transvaginal ultrasound examinations of the pelvis were performed. Transabdominal technique was performed for global imaging of the pelvis including uterus, ovaries, adnexal regions, and pelvic cul-de-sac. It was necessary to proceed with endovaginal exam following the transabdominal exam to visualize the ovaries and endometrium. Color and duplex  Doppler ultrasound was utilized to evaluate blood flow to the ovaries. COMPARISON:  Abdominal CT from earlier today. FINDINGS: Uterus Measurements: 9 x 4 x 6 cm. No fibroids or other mass visualized. Endometrium Thickness: 6 mm.  No focal abnormality visualized. Right ovary Measurements: 36 x 19 x 26 mm. Normal appearance/no adnexal mass. Left ovary Measurements: 50 x 44 x 15 mm. Asymmetric enlargement secondary to a 47 mm mass with no internal color Doppler flow and diffuse mid level echoes. No associated shadowing or bright spectral reflectors. Along the upper margin there may be superimposed fine septations. Pulsed Doppler evaluation of both ovaries demonstrates normal low-resistance arterial and venous waveforms. Other findings Small volume pelvic fluid which could be physiologic or reactive to patient's colitis. IMPRESSION: 1. No indication of ovarian torsion. 2. 5 cm left ovarian mass favoring endometrioma or hemorrhagic cyst. Short-interval follow up ultrasound in 6-12 weeks is recommended, preferably during the week following the patient's normal menses. Electronically Signed   By: Marnee Spring M.D.   On: 02/28/2016 21:45   Ct Abdomen Pelvis W Contrast  02/28/2016  CLINICAL DATA:  Abdominal pain with diarrhea. Left lower quadrant tenderness. EXAM: CT ABDOMEN AND PELVIS WITH CONTRAST TECHNIQUE: Multidetector CT imaging of the abdomen and pelvis was performed using the standard protocol following bolus administration of intravenous contrast. CONTRAST:  ISOVUE-300 IOPAMIDOL (ISOVUE-300) INJECTION 61% COMPARISON:  None. FINDINGS: Normal lung bases. No free air. There is free fluid in the pelvis. There is colonic wall thickening and pericolonic stranding associated with the descending colon. Proximal to the abnormal colon is moderate to severe fecal loading. The appendix is normal. The stomach and small bowel are normal. Liver, gallbladder, spleen, adrenal glands, pancreas, and kidneys are normal. The  abdominal aorta and its branching vessels are normal. No atherosclerotic change, aneurysm, or dissection. There  is free fluid in the pelvis. The uterus and right ovary are normal. There is a large complicated cyst in the left ovary measuring up to 5.3 cm with a probable enhancing septation. There also appears to be a small amount of enhancement along the periphery. No adenopathy. No other abnormalities in the pelvis. Visualized bones are unremarkable. IMPRESSION: 1. Colitis involving the descending colon could be infectious, inflammatory, or ischemic. 2. Complicated cyst measuring greater than 5 cm in the left ovary with adjacent fluid and peripheral enhancement. An ultrasound could better evaluate. If there is any concern for torsion, recommend Doppler images be included with the pelvic ultrasound. Electronically Signed   By: Gerome Sam III M.D   On: 02/28/2016 19:25   [place] Art/ven Flow Abd Pelv Doppler  02/28/2016  CLINICAL DATA:  Left lower quadrant pain.  History of ovarian cysts. EXAM: TRANSABDOMINAL AND TRANSVAGINAL ULTRASOUND OF PELVIS DOPPLER ULTRASOUND OF OVARIES TECHNIQUE: Both transabdominal and transvaginal ultrasound examinations of the pelvis were performed. Transabdominal technique was performed for global imaging of the pelvis including uterus, ovaries, adnexal regions, and pelvic cul-de-sac. It was necessary to proceed with endovaginal exam following the transabdominal exam to visualize the ovaries and endometrium. Color and duplex Doppler ultrasound was utilized to evaluate blood flow to the ovaries. COMPARISON:  Abdominal CT from earlier today. FINDINGS: Uterus Measurements: 9 x 4 x 6 cm. No fibroids or other mass visualized. Endometrium Thickness: 6 mm.  No focal abnormality visualized. Right ovary Measurements: 36 x 19 x 26 mm. Normal appearance/no adnexal mass. Left ovary Measurements: 50 x 44 x 15 mm. Asymmetric enlargement secondary to a 47 mm mass with no internal color Doppler flow and  diffuse mid level echoes. No associated shadowing or bright spectral reflectors. Along the upper margin there may be superimposed fine septations. Pulsed Doppler evaluation of both ovaries demonstrates normal low-resistance arterial and venous waveforms. Other findings Small volume pelvic fluid which could be physiologic or reactive to patient's colitis. IMPRESSION: 1. No indication of ovarian torsion. 2. 5 cm left ovarian mass favoring endometrioma or hemorrhagic cyst. Short-interval follow up ultrasound in 6-12 weeks is recommended, preferably during the week following the patient's normal menses. Electronically Signed   By: Marnee Spring M.D.   On: 02/28/2016 21:45    EKG:   Orders placed or performed in visit on 05/03/08  . EKG 12-Lead    ASSESSMENT AND PLAN:   Vicki Dominguez is a 30 y.o. female presenting with Abdominal Pain . Admitted 02/28/2016 : Day #: 2 days 1. Acute colitis continue Cipro and Flagyl advance diet as tolerated will start her on clears 2. GERD without esophagitis Zantac   All the records are reviewed and case discussed with Care Management/Social Workerr. Management plans discussed with the patient, family and they are in agreement.  CODE STATUS: full TOTAL TIME TAKING CARE OF THIS PATIENT: 28 minutes.   POSSIBLE D/C IN 1DAYS, DEPENDING ON CLINICAL CONDITION.   Charmaine Placido,  Mardi Mainland.D on 03/01/2016 at 2:24 PM  Between 7am to 6pm - Pager - [phone]  After 6pm: House Pager: - [phone]  [facility]  Office  [phone]  CC: Primary care physician; [facility] PRIMARY CARE

## 2016-03-01 NOTE — Progress Notes (Signed)
Initial Nutrition Assessment  DOCUMENTATION CODES:   Not applicable  INTERVENTION:   Await diet progression as medically able Will recommend Boost Breeze po BID, each supplement provides 250 kcal and 9 grams of protein RD discussed nutrition therapy to reduce the irritation of the gastrointestinal tract to promote healing. Provided list of recommended low fiberous foods in comparison to foods with high amounts of fiber as pt was asking about recommended nutrition therapy after discharge as well as in the hospital.   NUTRITION DIAGNOSIS:   Inadequate oral intake related to acute illness as evidenced by per patient/family report.  GOAL:   Patient will meet greater than or equal to 90% of their needs  MONITOR:   PO intake, Supplement acceptance, Labs, Weight trends, I & O's, Diet advancement  REASON FOR ASSESSMENT:   Malnutrition Screening Tool    ASSESSMENT:   Pt admitted with acute colitis and ovarian cyst.  Past Medical History  Diagnosis Date  . Arthritis   . TMJ arthritis   . Anxiety   . GERD (gastroesophageal reflux disease)   . Migraine     Diet Order:  Diet clear liquid Room service appropriate?: Yes; Fluid consistency:: Thin   Pt reports tolerating CL this am without difficulty however having abdominal pain off and on.   Pt reports two weeks ago having very bad diarrhea and that after some treatment starting to feel better. Pt reports not having diarrhea over the weekend and thought she was over her 'bug.' Pt reports however symptoms came back Monday, worse than before.   Pt reports typically not eating breakfast but would eat later in the morning. Pt reports eating supper and desert most every day. Pt reports her appetite was really good over the weekend but since Monday not good at all. Pt reports still trying to eat what she could for the past couple few weeks and has been drinking lots of Gatorade.  Medications: Cipro, Flagyl, NS at 6975mL/hr Labs:  reviewed   Gastrointestinal Profile: pt reports not having had a BM since admission, diarrhea on 6/5. Last BM:  02/28/2016   Nutrition-Focused Physical Exam Findings: Nutrition-Focused physical exam completed. Findings are WDL for fat depletion, muscle depletion, and edema.    Weight Change: Pt reports weight was 150-152lbs a couple of months ago and reports she was 138lbs at home PTA.  Current weight in CHL 148lbs    Skin:  Reviewed, no issues   Height:   Ht Readings from Last 1 Encounters:  02/28/16 5\' 2"  (1.575 m)    Weight:   Wt Readings from Last 1 Encounters:  02/29/16 148 lb 9.6 oz (67.405 kg)   Wt Readings from Last 10 Encounters:  02/29/16 148 lb 9.6 oz (67.405 kg)  02/24/16 138 lb (62.596 kg)  05/26/15 150 lb (68.04 kg)  03/18/15 150 lb (68.04 kg)    BMI:  Body mass index is 27.17 kg/(m^2).  Estimated Nutritional Needs:   Kcal:  1675-2010kcals  Protein:  74-87g protein  Fluid:  >/= 1.8L fluid  EDUCATION NEEDS:   No education needs identified at this time  Leda QuailAllyson Kleber Crean, RD, LDN Pager [phone] Weekend/On-Call Pager [phone]

## 2016-03-01 NOTE — Plan of Care (Signed)
Problem: Pain Managment: Goal: General experience of comfort will improve Outcome: Progressing Oxycodone and dilaudid given throughout the shift with minimal improvement. Pt declined further action. Zofran given x2 for nausea with improvement. No emesis. No BM during the shift. Fair appetite. Continue clear liquid diet.

## 2016-03-02 MED ORDER — IBUPROFEN 600 MG PO TABS
600.0000 mg | ORAL_TABLET | Freq: Four times a day (QID) | ORAL | Status: DC | PRN
  Administered 2016-03-02: 600 mg via ORAL
  Filled 2016-03-02 (×2): qty 1

## 2016-03-02 MED ORDER — CIPROFLOXACIN HCL 250 MG PO TABS
500.0000 mg | ORAL_TABLET | Freq: Two times a day (BID) | ORAL | Status: DC
  Administered 2016-03-02 – 2016-03-05 (×6): 500 mg via ORAL
  Filled 2016-03-02 (×2): qty 2
  Filled 2016-03-02: qty 1
  Filled 2016-03-02 (×2): qty 2
  Filled 2016-03-02: qty 1
  Filled 2016-03-02 (×2): qty 2

## 2016-03-02 MED ORDER — DIPHENHYDRAMINE HCL 25 MG PO CAPS
25.0000 mg | ORAL_CAPSULE | Freq: Four times a day (QID) | ORAL | Status: DC | PRN
  Administered 2016-03-02 – 2016-03-07 (×7): 25 mg via ORAL
  Filled 2016-03-02 (×7): qty 1

## 2016-03-02 MED ORDER — HYDROMORPHONE HCL 1 MG/ML IJ SOLN
0.5000 mg | INTRAMUSCULAR | Status: DC | PRN
  Administered 2016-03-02 – 2016-03-03 (×5): 0.5 mg via INTRAVENOUS
  Filled 2016-03-02 (×5): qty 1

## 2016-03-02 MED ORDER — METRONIDAZOLE 500 MG PO TABS
500.0000 mg | ORAL_TABLET | Freq: Three times a day (TID) | ORAL | Status: DC
  Administered 2016-03-02 – 2016-03-05 (×9): 500 mg via ORAL
  Filled 2016-03-02 (×10): qty 1

## 2016-03-02 NOTE — Progress Notes (Signed)
[facility] - [place] at [facility]   PATIENT NAME: Vicki Dominguez    MRN#:  [id number]  DATE OF BIRTH:  20-Nov-1985  SUBJECTIVE:  Hospital Day: 3 days Vicki Dominguez is a 30 y.o. female presenting with Abdominal Pain .   Overnight events: No overnight events Interval Events: continued complaints of pain but tolerating diet ok  REVIEW OF SYSTEMS:  CONSTITUTIONAL: No fever, fatigue or weakness.  EYES: No blurred or double vision.  EARS, NOSE, AND THROAT: No tinnitus or ear pain.  RESPIRATORY: No cough, shortness of breath, wheezing or hemoptysis.  CARDIOVASCULAR: No chest pain, orthopnea, edema.  GASTROINTESTINAL: No nausea, vomiting, diarrhea -positive abdominal pain.  GENITOURINARY: No dysuria, hematuria.  ENDOCRINE: No polyuria, nocturia,  HEMATOLOGY: No anemia, easy bruising or bleeding SKIN: No rash or lesion. MUSCULOSKELETAL: No joint pain or arthritis.   NEUROLOGIC: No tingling, numbness, weakness.  PSYCHIATRY: No anxiety or depression.   DRUG ALLERGIES:  No Known Allergies  VITALS:  Blood pressure 102/57, pulse 78, temperature 97.4 F (36.3 C), temperature source Oral, resp. rate 20, height  (1.575 m), weight 148 lb 9.6 oz (67.405 kg), last menstrual period 02/03/2016, SpO2 99 %.  PHYSICAL EXAMINATION:  VITAL SIGNS: Filed Vitals:   03/02/16 0432 03/02/16 0525  BP: 96/54 102/57  Pulse: 78   Temp:    Resp:     GENERAL:29 y.o.female currently in no acute distress.  HEAD: Normocephalic, atraumatic.  EYES: Pupils equal, round, reactive to light. Extraocular muscles intact. No scleral icterus.  MOUTH: Moist mucosal membrane. Dentition intact. No abscess noted.  EAR, NOSE, THROAT: Clear without exudates. No external lesions.  NECK: Supple. No thyromegaly. No nodules. No JVD.  PULMONARY: Clear to ascultation, without wheeze rails or rhonci. No use of accessory muscles, Good respiratory effort. good air entry bilaterally CHEST: Nontender  to palpation.  CARDIOVASCULAR: S1 and S2. Regular rate and rhythm. No murmurs, rubs, or gallops. No edema. Pedal pulses 2+ bilaterally.  GASTROINTESTINAL: Soft, tenderness to palpation left lower quadrant without rebound or guarding, nondistended. No masses. Positive bowel sounds. No hepatosplenomegaly.  MUSCULOSKELETAL: No swelling, clubbing, or edema. Range of motion full in all extremities.  NEUROLOGIC: Cranial nerves II through XII are intact. No gross focal neurological deficits. Sensation intact. Reflexes intact.  SKIN: No ulceration, lesions, rashes, or cyanosis. Skin warm and dry. Turgor intact.  PSYCHIATRIC: Mood, affect within normal limits. The patient is awake, alert and oriented x 3. Insight, judgment intact.      LABORATORY PANEL:   CBC  Recent Labs Lab 02/29/16 0617  WBC 8.6  HGB 13.5  HCT 40.0  PLT 144*   ------------------------------------------------------------------------------------------------------------------  Chemistries   Recent Labs Lab 02/28/16 1408 02/29/16 0617  NA 137 137  K 3.5 4.0  CL 104 107  CO2 26 23  GLUCOSE 98 154*  BUN 17 10  CREATININE 0.68 0.52  CALCIUM 9.6 8.6*  AST 20  --   ALT 11*  --   ALKPHOS 67  --   BILITOT 0.4  --    ------------------------------------------------------------------------------------------------------------------  Cardiac Enzymes No results for input(s): TROPONINI in the last 168 hours. ------------------------------------------------------------------------------------------------------------------  RADIOLOGY:  No results found.  EKG:   Orders placed or performed in visit on 05/03/08  . EKG 12-Lead    ASSESSMENT AND PLAN:   Vicki Dominguez is a 30 y.o. female presenting with Abdominal Pain . Admitted 02/28/2016 : Day #: 3 days 1. Acute colitis continue Cipro and Flagyl advance diet as tolerated -  decrease narcotics 2. GERD without esophagitis Zantac   All the records are reviewed and  case discussed with Care Management/Social Workerr. Management plans discussed with the patient, family and they are in agreement.  CODE STATUS: full TOTAL TIME TAKING CARE OF THIS PATIENT: 28 minutes.   POSSIBLE D/C IN 1DAYS, DEPENDING ON CLINICAL CONDITION.   Hower,  Mardi MainlandDavid K M.D on 03/02/2016 at 1:20 PM  Between 7am to 6pm - Pager - [phone]  After 6pm: House Pager: - [phone]  [facility]  Office  [phone]  CC: Primary care physician; [facility]

## 2016-03-02 NOTE — Care Management (Signed)
Spoke with Dr. Clint GuyHower. Possible discharge later today. No follow-up needs identified. Gwenette GreetBrenda S Kwame Ryland RN MSN CCM Care Management [phone]

## 2016-03-02 NOTE — Plan of Care (Signed)
Problem: Pain Managment: Goal: General experience of comfort will improve Outcome: Progressing Pt c/o abd pain and headache throughout the day. Pain meds given around the clock with minimal improvement. MD is aware. Zofran givenx2 for nausea with improvement. Advanced to soft diet. Fair appetite. Benadryl given for itching.

## 2016-03-03 LAB — CREATININE, SERUM
Creatinine, Ser: 0.62 mg/dL (ref 0.44–1.00)
GFR calc Af Amer: >60 mL/min (ref >60)

## 2016-03-03 MED ORDER — PANTOPRAZOLE SODIUM 40 MG PO TBEC
40.0000 mg | DELAYED_RELEASE_TABLET | Freq: Two times a day (BID) | ORAL | Status: DC
  Administered 2016-03-03 – 2016-03-08 (×11): 40 mg via ORAL
  Filled 2016-03-03 (×11): qty 1

## 2016-03-03 MED ORDER — DICYCLOMINE HCL 10 MG PO CAPS
10.0000 mg | ORAL_CAPSULE | Freq: Three times a day (TID) | ORAL | Status: DC
  Administered 2016-03-03 – 2016-03-08 (×18): 10 mg via ORAL
  Filled 2016-03-03 (×19): qty 1

## 2016-03-03 MED ORDER — POLYETHYLENE GLYCOL 3350 17 G PO PACK
17.0000 g | PACK | Freq: Every day | ORAL | Status: DC
  Administered 2016-03-04 – 2016-03-08 (×3): 17 g via ORAL
  Filled 2016-03-03 (×5): qty 1

## 2016-03-03 MED ORDER — CIPROFLOXACIN HCL 500 MG PO TABS: 500.0000 mg | ORAL_TABLET | Freq: Two times a day (BID) | ORAL | Status: DC

## 2016-03-03 MED ORDER — OXYCODONE HCL 5 MG PO TABS
10.0000 mg | ORAL_TABLET | ORAL | Status: DC | PRN
  Administered 2016-03-03 – 2016-03-05 (×10): 10 mg via ORAL
  Administered 2016-03-05: 09:00:00 5 mg via ORAL
  Administered 2016-03-05 – 2016-03-07 (×7): 10 mg via ORAL
  Filled 2016-03-03: qty 2
  Filled 2016-03-03: qty 1
  Filled 2016-03-03 (×15): qty 2

## 2016-03-03 MED ORDER — METRONIDAZOLE 500 MG PO TABS: 500.0000 mg | ORAL_TABLET | Freq: Three times a day (TID) | ORAL | Status: DC

## 2016-03-03 MED ORDER — FLUTICASONE PROPIONATE 50 MCG/ACT NA SUSP
1.0000 | Freq: Every day | NASAL | Status: DC
  Administered 2016-03-03 – 2016-03-08 (×6): 1 via NASAL
  Filled 2016-03-03: qty 16

## 2016-03-03 MED ORDER — OXYCODONE HCL 5 MG PO TABS: ORAL_TABLET | ORAL | Status: DC

## 2016-03-03 MED ORDER — ALUM & MAG HYDROXIDE-SIMETH 200-200-20 MG/5ML PO SUSP
30.0000 mL | Freq: Four times a day (QID) | ORAL | Status: DC | PRN
  Administered 2016-03-03 – 2016-03-06 (×5): 30 mL via ORAL
  Filled 2016-03-03 (×5): qty 30

## 2016-03-03 MED ORDER — DOCUSATE SODIUM 100 MG PO CAPS
100.0000 mg | ORAL_CAPSULE | Freq: Two times a day (BID) | ORAL | Status: DC
  Administered 2016-03-03 – 2016-03-08 (×11): 100 mg via ORAL
  Filled 2016-03-03 (×11): qty 1

## 2016-03-03 MED ORDER — SUCRALFATE 1 GM/10ML PO SUSP
1.0000 g | Freq: Three times a day (TID) | ORAL | Status: DC
  Administered 2016-03-03 – 2016-03-08 (×18): 1 g via ORAL
  Filled 2016-03-03 (×19): qty 10

## 2016-03-03 MED ORDER — MORPHINE SULFATE (PF) 2 MG/ML IV SOLN: 2.0000 mg | INTRAVENOUS | Status: DC | PRN

## 2016-03-03 MED ORDER — FAMOTIDINE IN NACL 20-0.9 MG/50ML-% IV SOLN
20.0000 mg | Freq: Once | INTRAVENOUS | Status: AC
  Administered 2016-03-03: 20 mg via INTRAVENOUS
  Filled 2016-03-03: qty 50

## 2016-03-03 NOTE — Progress Notes (Signed)
Order to leave IV out, patient has no IV medication other than IV NS, per md ok to leave out and d/c IV fluids all other medications can be given PO

## 2016-03-03 NOTE — Discharge Summary (Signed)
[facility] - [place] at [facility]   PATIENT NAME: Vicki Dominguez    MR#:  [id number] DATE OF BIRTH:  1986-01-28  DATE OF ADMISSION:  02/28/2016 ADMITTING PHYSICIAN: Oralia Manis, MD  DATE OF DISCHARGE: 03/03/2016  PRIMARY CARE PHYSICIAN: [facility]    ADMISSION DIAGNOSIS:  Colitis [K52.9] LLQ pain [R10.32]  DISCHARGE DIAGNOSIS:  Principal Problem:   Acute colitis Active Problems:   Anxiety   GERD (gastroesophageal reflux disease)   Migraine   SECONDARY DIAGNOSIS:   Past Medical History  Diagnosis Date  . Arthritis   . TMJ arthritis   . Anxiety   . GERD (gastroesophageal reflux disease)   . Migraine     HOSPITAL COURSE:  Vicki Dominguez  is a 30 y.o. female admitted 02/28/2016 with chief complaint Abdominal Pain . Please see H&P performed by Oralia Manis, MD for further information. Presented with the above symptoms found to have colitis on CT. Started on ciprofloxacin/flagyl and pain control. Symptoms improved  DISCHARGE CONDITIONS:   stable  CONSULTS OBTAINED:     DRUG ALLERGIES:  No Known Allergies  DISCHARGE MEDICATIONS:   Current Discharge Medication List    START taking these medications   Details  ciprofloxacin (CIPRO) 500 MG tablet Take 1 tablet (500 mg total) by mouth 2 (two) times daily. Qty: 8 tablet, Refills: 0    metroNIDAZOLE (FLAGYL) 500 MG tablet Take 1 tablet (500 mg total) by mouth 3 (three) times daily. Qty: 12 tablet, Refills: 0    oxyCODONE (OXY IR/ROXICODONE) 5 MG immediate release tablet 1-2 tablets every 4 hours as needed for moderate to severe pain Qty: 30 tablet, Refills: 0      CONTINUE these medications which have NOT CHANGED   Details  ADDERALL XR 30 MG 24 hr capsule Take 30 mg by mouth every morning. Refills: 0    alprazolam (XANAX) 2 MG tablet Take 2 mg by mouth 2 (two) times daily. Refills: 4    aspirin-acetaminophen-caffeine (EXCEDRIN MIGRAINE) 250-250-65 MG tablet Take 1-2 tablets by  mouth every 6 (six) hours as needed for headache.    bismuth subsalicylate (PEPTO-BISMOL) 262 MG/15ML suspension 2 tablespoon or one double strength tablet 3 times a day with Zantac for the next 3-5 days Qty: 473 mL, Refills: 0    ibuprofen (ADVIL,MOTRIN) 200 MG tablet Take 200 mg by mouth every 6 (six) hours as needed.    ondansetron (ZOFRAN ODT) 8 MG disintegrating tablet Take 1 tablet (8 mg total) by mouth every 8 (eight) hours as needed for nausea or vomiting. Qty: 20 tablet, Refills: 0    PARoxetine (PAXIL-CR) 25 MG 24 hr tablet Take 25 mg by mouth daily. Refills: 4    ranitidine (ZANTAC) 150 MG capsule 1 capsule 3 times a day for the next 3-5 days, take the Pepto-Bismol Qty: 15 capsule, Refills: 0         DISCHARGE INSTRUCTIONS:    DIET:  Regular diet  DISCHARGE CONDITION:  Stable  ACTIVITY:  Activity as tolerated  OXYGEN:  Home Oxygen: No.   Oxygen Delivery: room air  DISCHARGE LOCATION:  home   If you experience worsening of your admission symptoms, develop shortness of breath, life threatening emergency, suicidal or homicidal thoughts you must seek medical attention immediately by calling 911 or calling your MD immediately  if symptoms less severe.  You Must read complete instructions/literature along with all the possible adverse reactions/side effects for all the Medicines you take and that have been prescribed to  you. Take any new Medicines after you have completely understood and accpet all the possible adverse reactions/side effects.   Please note  You were cared for by a hospitalist during your hospital stay. If you have any questions about your discharge medications or the care you received while you were in the hospital after you are discharged, you can call the unit and asked to speak with the hospitalist on call if the hospitalist that took care of you is not available. Once you are discharged, your primary care physician will handle any further  medical issues. Please note that NO REFILLS for any discharge medications will be authorized once you are discharged, as it is imperative that you return to your primary care physician (or establish a relationship with a primary care physician if you do not have one) for your aftercare needs so that they can reassess your need for medications and monitor your lab values.    On the day of Discharge:   VITAL SIGNS:  Blood pressure 97/66, pulse 85, temperature 97.9 F (36.6 C), temperature source Oral, resp. rate 18, height 5\' 2"  (1.575 m), weight 148 lb 9.6 oz (67.405 kg), last menstrual period 02/03/2016, SpO2 100 %.  I/O:   Intake/Output Summary (Last 24 hours) at 03/03/16 1315 Last data filed at 03/03/16 0900  Gross per 24 hour  Intake 1881.25 ml  Output      0 ml  Net 1881.25 ml    PHYSICAL EXAMINATION:  GENERAL:  30 y.o.-year-old patient lying in the bed with no acute distress.  EYES: Pupils equal, round, reactive to light and accommodation. No scleral icterus. Extraocular muscles intact.  HEENT: Head atraumatic, normocephalic. Oropharynx and nasopharynx clear.  NECK:  Supple, no jugular venous distention. No thyroid enlargement, no tenderness.  LUNGS: Normal breath sounds bilaterally, no wheezing, rales,rhonchi or crepitation. No use of accessory muscles of respiration.  CARDIOVASCULAR: S1, S2 normal. No murmurs, rubs, or gallops.  ABDOMEN: Soft, non-tender, non-distended. Bowel sounds present. No organomegaly or mass.  EXTREMITIES: No pedal edema, cyanosis, or clubbing.  NEUROLOGIC: Cranial nerves II through XII are intact. Muscle strength 5/5 in all extremities. Sensation intact. Gait not checked.  PSYCHIATRIC: The patient is alert and oriented x 3.  SKIN: No obvious rash, lesion, or ulcer.   DATA REVIEW:   CBC  Recent Labs Lab 02/29/16 0617  WBC 8.6  HGB 13.5  HCT 40.0  PLT 144*    Chemistries   Recent Labs Lab 02/28/16 1408 02/29/16 0617 03/03/16 0446  NA  137 137  --   K 3.5 4.0  --   CL 104 107  --   CO2 26 23  --   GLUCOSE 98 154*  --   BUN 17 10  --   CREATININE 0.68 0.52 0.62  CALCIUM 9.6 8.6*  --   AST 20  --   --   ALT 11*  --   --   ALKPHOS 67  --   --   BILITOT 0.4  --   --     Cardiac Enzymes No results for input(s): TROPONINI in the last 168 hours.  Microbiology Results  Results for orders placed or performed during the hospital encounter of 02/28/16  Urine culture     Status: None   Collection Time: 02/29/16  5:30 AM  Result Value Ref Range Status   Specimen Description URINE, RANDOM  Final   Special Requests NONE  Final   Culture NO GROWTH Performed at [facility]  Final   Report Status 03/01/2016 FINAL  Final    RADIOLOGY:  No results found.   Management plans discussed with the patient, family and they are in agreement.  CODE STATUS:     Code Status Orders        Start     Ordered   02/29/16 0051  Full code   Continuous     02/29/16 0050    Code Status History    Date Active Date Inactive Code Status Order ID Comments User Context   This patient has a current code status but no historical code status.      TOTAL TIME TAKING CARE OF THIS PATIENT: 28 minutes.    Tysheem Accardo,  Mardi Mainland.D on 03/03/2016 at 1:15 PM  Between 7am to 6pm - Pager - [phone]  After 6pm go to [email or web address] - Therapist, nutritional Hospitalists  Office  [phone]  CC: Primary care physician; [facility]

## 2016-03-03 NOTE — Progress Notes (Signed)
[facility] - [place] at [facility]   PATIENT NAME: Vicki Dominguez    MRN#:  [id number]  DATE OF BIRTH:  1986-05-22  SUBJECTIVE:  Hospital Day: 4 days Vicki Dominguez is a 30 y.o. female presenting with Abdominal Pain .   Overnight events: No overnight events Interval Events: complains of worsening abdominal pain, epigastric/LUQ - burning  REVIEW OF SYSTEMS:  CONSTITUTIONAL: No fever, fatigue or weakness.  EYES: No blurred or double vision.  EARS, NOSE, AND THROAT: No tinnitus or ear pain.  RESPIRATORY: No cough, shortness of breath, wheezing or hemoptysis.  CARDIOVASCULAR: No chest pain, orthopnea, edema.  GASTROINTESTINAL: positive nausea, denies vomiting, diarrhea -positive abdominal pain.  GENITOURINARY: No dysuria, hematuria.  ENDOCRINE: No polyuria, nocturia,  HEMATOLOGY: No anemia, easy bruising or bleeding SKIN: No rash or lesion. MUSCULOSKELETAL: No joint pain or arthritis.   NEUROLOGIC: No tingling, numbness, weakness.  PSYCHIATRY: No anxiety or depression.   DRUG ALLERGIES:  No Known Allergies  VITALS:  Blood pressure 97/66, pulse 85, temperature 97.9 F (36.6 C), temperature source Oral, resp. rate 18, height  (1.575 m), weight 148 lb 9.6 oz (67.405 kg), last menstrual period 02/03/2016, SpO2 100 %.  PHYSICAL EXAMINATION:  VITAL SIGNS: Filed Vitals:   03/03/16 0505 03/03/16 0509  BP: 81/45 97/66  Pulse: 85 85  Temp: 97.9 F (36.6 C)   Resp: 18 18   GENERAL:29 y.o.female currently in no acute distress.  HEAD: Normocephalic, atraumatic.  EYES: Pupils equal, round, reactive to light. Extraocular muscles intact. No scleral icterus.  MOUTH: Moist mucosal membrane. Dentition intact. No abscess noted.  EAR, NOSE, THROAT: Clear without exudates. No external lesions.  NECK: Supple. No thyromegaly. No nodules. No JVD.  PULMONARY: Clear to ascultation, without wheeze rails or rhonci. No use of accessory muscles, Good respiratory  effort. good air entry bilaterally CHEST: Nontender to palpation.  CARDIOVASCULAR: S1 and S2. Regular rate and rhythm. No murmurs, rubs, or gallops. No edema. Pedal pulses 2+ bilaterally.  GASTROINTESTINAL: Soft, without tenderness, nondistended. No masses. Positive bowel sounds. No hepatosplenomegaly.  MUSCULOSKELETAL: No swelling, clubbing, or edema. Range of motion full in all extremities.  NEUROLOGIC: Cranial nerves II through XII are intact. No gross focal neurological deficits. Sensation intact. Reflexes intact.  SKIN: No ulceration, lesions, rashes, or cyanosis. Skin warm and dry. Turgor intact.  PSYCHIATRIC: Mood, affect within normal limits. The patient is awake, alert and oriented x 3. Insight, judgment intact.      LABORATORY PANEL:   CBC  Recent Labs Lab 02/29/16 0617  WBC 8.6  HGB 13.5  HCT 40.0  PLT 144*   ------------------------------------------------------------------------------------------------------------------  Chemistries   Recent Labs Lab 02/28/16 1408 02/29/16 0617 03/03/16 0446  NA 137 137  --   K 3.5 4.0  --   CL 104 107  --   CO2 26 23  --   GLUCOSE 98 154*  --   BUN 17 10  --   CREATININE 0.68 0.52 0.62  CALCIUM 9.6 8.6*  --   AST 20  --   --   ALT 11*  --   --   ALKPHOS 67  --   --   BILITOT 0.4  --   --    ------------------------------------------------------------------------------------------------------------------  Cardiac Enzymes No results for input(s): TROPONINI in the last 168 hours. ------------------------------------------------------------------------------------------------------------------  RADIOLOGY:  No results found.  EKG:   Orders placed or performed in visit on 05/03/08  . EKG 12-Lead    ASSESSMENT AND PLAN:  Vicki Dominguez is a 30 y.o. female presenting with Abdominal Pain . Admitted 02/28/2016 : Day #: 4 days 1. Acute colitis continue Cipro and Flagyl advance diet as tolerated - decrease narcotics,  add bentyl 2. GERD without esophagitis: start ppi   All the records are reviewed and case discussed with Care Management/Social Workerr. Management plans discussed with the patient, family and they are in agreement.  CODE STATUS: full TOTAL TIME TAKING CARE OF THIS PATIENT: 33 minutes.   POSSIBLE D/C IN 1DAYS, DEPENDING ON CLINICAL CONDITION.   Hower,  Mardi MainlandDavid K M.D on 03/03/2016 at 2:31 PM  Between 7am to 6pm - Pager - [phone]  After 6pm: House Pager: - [phone]  [facility]  Office  [phone]  CC: Primary care physician; [facility]

## 2016-03-03 NOTE — Progress Notes (Signed)
Notified Dr. Clint GuyHower in person that patient reports she is having new onset lower abdominal burning sensation, MD acknowledgd.

## 2016-03-04 MED ORDER — FLEET ENEMA 7-19 GM/118ML RE ENEM
1.0000 | ENEMA | Freq: Once | RECTAL | Status: AC
  Administered 2016-03-04: 1 via RECTAL

## 2016-03-04 MED ORDER — LACTULOSE 10 GM/15ML PO SOLN
30.0000 g | Freq: Once | ORAL | Status: AC
  Administered 2016-03-04: 16:00:00 30 g via ORAL
  Filled 2016-03-04: qty 60

## 2016-03-04 MED ORDER — HYDROMORPHONE HCL 2 MG PO TABS
2.0000 mg | ORAL_TABLET | Freq: Once | ORAL | Status: AC
  Administered 2016-03-04: 2 mg via ORAL
  Filled 2016-03-04: qty 1

## 2016-03-04 MED ORDER — GI COCKTAIL ~~LOC~~
30.0000 mL | Freq: Every day | ORAL | Status: DC
  Administered 2016-03-04 – 2016-03-07 (×4): 30 mL via ORAL
  Filled 2016-03-04 (×5): qty 30

## 2016-03-04 MED ORDER — GI COCKTAIL ~~LOC~~
30.0000 mL | Freq: Once | ORAL | Status: AC
  Administered 2016-03-04: 30 mL via ORAL
  Filled 2016-03-04: qty 30

## 2016-03-04 MED ORDER — CALCIUM CARBONATE ANTACID 500 MG PO CHEW
1.0000 | CHEWABLE_TABLET | Freq: Two times a day (BID) | ORAL | Status: DC
  Administered 2016-03-04 – 2016-03-08 (×9): 200 mg via ORAL
  Filled 2016-03-04 (×9): qty 1

## 2016-03-04 NOTE — Progress Notes (Signed)
Patient ID: Vicki Dominguez, female   DOB: [birth year], 30 y.o.   MRN: [id number] [employer] [facility] PROGRESS NOTE  Vicki Dominguez WUJ:[id number] DOB: May 26, 1986 DOA: 02/28/2016 PCP: [facility]  HPI/Subjective: Patient states she has burning pain that is not going away. It felt better after the GI cocktail but now is back. She also has lower abdominal pain and left lower quadrant pain. She has not moved her bowels in a few days. She is not eating. She asked for Dilaudid prior to a bowel regimen  Objective: Filed Vitals:   03/04/16 0537 03/04/16 1355  BP: 92/62 119/75  Pulse:  115  Temp:  98.5 F (36.9 C)  Resp:  20    Filed Weights   02/28/16 1404 02/29/16 0106  Weight: 62.596 kg (138 lb) 67.405 kg (148 lb 9.6 oz)    ROS: Review of Systems  Constitutional: Negative for fever and chills.  Eyes: Negative for blurred vision.  Respiratory: Negative for cough and shortness of breath.   Cardiovascular: Negative for chest pain.  Gastrointestinal: Positive for nausea, abdominal pain and constipation. Negative for vomiting and diarrhea.  Genitourinary: Negative for dysuria.  Musculoskeletal: Negative for joint pain.  Neurological: Negative for dizziness and headaches.   Exam: Physical Exam  Constitutional: She is oriented to person, place, and time.  HENT:  Nose: No mucosal edema.  Mouth/Throat: No oropharyngeal exudate or posterior oropharyngeal edema.  Eyes: Conjunctivae, EOM and lids are normal. Pupils are equal, round, and reactive to light.  Neck: No JVD present. Carotid bruit is not present. No edema present. No thyroid mass and no thyromegaly present.  Cardiovascular: S1 normal and S2 normal.  Exam reveals no gallop.   No murmur heard. Pulses:      Dorsalis pedis pulses are 2+ on the right side, and 2+ on the left side.  Respiratory: No respiratory distress. She has no wheezes. She has no rhonchi. She has no rales.  GI: Soft. Bowel sounds are normal. There is  tenderness in the epigastric area.  Musculoskeletal:       Right ankle: She exhibits no swelling.       Left ankle: She exhibits no swelling.  Lymphadenopathy:    She has no cervical adenopathy.  Neurological: She is alert and oriented to person, place, and time. No cranial nerve deficit.  Skin: Skin is warm. No rash noted. Nails show no clubbing.  Psychiatric: She has a normal mood and affect.      Data Reviewed: Basic Metabolic Panel:  Recent Labs Lab 02/28/16 1408 02/29/16 0617 03/03/16 0446  NA 137 137  --   K 3.5 4.0  --   CL 104 107  --   CO2 26 23  --   GLUCOSE 98 154*  --   BUN 17 10  --   CREATININE 0.68 0.52 0.62  CALCIUM 9.6 8.6*  --    Liver Function Tests:  Recent Labs Lab 02/28/16 1408  AST 20  ALT 11*  ALKPHOS 67  BILITOT 0.4  PROT 7.3  ALBUMIN 4.4    Recent Labs Lab 02/28/16 1408  LIPASE 23   CBC:  Recent Labs Lab 02/28/16 1408 02/29/16 0617  WBC 11.0 8.6  HGB 14.5 13.5  HCT 42.9 40.0  MCV 85.5 85.5  PLT 157 144*     Recent Results (from the past 240 hour(s))  Rapid strep screen     Status: None   Collection Time: 02/24/16  8:55 PM  Result Value Ref Range  Status   Streptococcus, Group A Screen (Direct) NEGATIVE NEGATIVE Final    Comment: (NOTE) A Rapid Antigen test may result negative if the antigen level in the sample is below the detection level of this test. The FDA has not cleared this test as a stand-alone test therefore the rapid antigen negative result has reflexed to a Group A Strep culture.   Culture, group A strep     Status: None (Preliminary result)   Collection Time: 02/24/16  8:55 PM  Result Value Ref Range Status   Specimen Description THROAT  Final   Special Requests NONE Reflexed from [id number]  Final   Culture TOO YOUNG TO READ  Final   Report Status PENDING  Incomplete  Urine culture     Status: None   Collection Time: 02/29/16  5:30 AM  Result Value Ref Range Status   Specimen Description URINE, RANDOM   Final   Special Requests NONE  Final   Culture NO GROWTH Performed at [facility]   Final   Report Status 03/01/2016 FINAL  Final      Scheduled Meds: . alprazolam  2 mg Oral BID  . amphetamine-dextroamphetamine  30 mg Oral Daily  . calcium carbonate  1 tablet Oral BID  . ciprofloxacin  500 mg Oral BID  . dicyclomine  10 mg Oral TID AC & HS  . docusate sodium  100 mg Oral BID  . enoxaparin (LOVENOX) injection  40 mg Subcutaneous Q24H  . feeding supplement  1 Container Oral BID BM  . fluticasone  1 spray Each Nare Daily  . gi cocktail  30 mL Oral QHS  . lactulose  30 g Oral Once  . metroNIDAZOLE  500 mg Oral Q8H  . nicotine  21 mg Transdermal Daily  . pantoprazole  40 mg Oral BID  . PARoxetine  25 mg Oral Daily  . polyethylene glycol  17 g Oral Daily  . sucralfate  1 g Oral TID WC & HS    Assessment/Plan:  1. Gastroesophageal reflux disease with out esophagitis. Patient on oral Protonix and Carafate. I added a GI cocktail. No gastrointestinal coverage over the weekend. 2. Acute colitis seen on CT scan. Patient not having diarrhea. Sometimes radiologist over read CAT scan on colitis. Continue Cipro and Flagyl. 3. Left lower quadrant pain. Patient has a hemorrhagic cyst 4. Anxiety depression on Paxil 5. Constipation- patient shows an enema today. I will also give lactulose. Continue daily MiraLAX  Code Status:     Code Status Orders        Start     Ordered   02/29/16 0051  Full code   Continuous     02/29/16 0050    Code Status History    Date Active Date Inactive Code Status Order ID Comments User Context   This patient has a current code status but no historical code status.     Disposition Plan: Unclear when I can send her home. Patient needs to eat better.  Antibiotics:  Cipro  Flagyl  Time spent: 25 minutes  Alford Highland  [employer]

## 2016-03-05 MED ORDER — ENOXAPARIN SODIUM 40 MG/0.4ML ~~LOC~~ SOLN
40.0000 mg | SUBCUTANEOUS | Status: DC
  Administered 2016-03-06 – 2016-03-08 (×3): 40 mg via SUBCUTANEOUS
  Filled 2016-03-05 (×3): qty 0.4

## 2016-03-05 NOTE — Progress Notes (Signed)
Pt had loose, watery BM earlier this evening, witnessed by this RN.  Pt now up walking in halls with husband.  Pt ate two ice creams, one apple sauce, and had two cans of coke and two chocolate milks.  Pt bought candy for a snack tonight at vending machine.  Pt also tried some prune juice, ensure, and plenty of ice water. Henriette CombsSarah Raye Slyter RN

## 2016-03-05 NOTE — Progress Notes (Signed)
Patient ID: Vicki Dominguez, female   DOB: Nov 17, 1985, 30 y.o.   MRN: [id number] [employer] PROGRESS NOTE  Vicki Dominguez WUJ:[id number] DOB: Nov 17, 1985 DOA: 02/28/2016 PCP: [facility]  HPI/Subjective: Patient still has burning abdominal pain. Lower abdominal pain and gurgling in her abdomen. Feels bloated. She is scared to go home. She's not eating very well. Had bowel movement yesterday after medication.  Objective: Filed Vitals:   03/04/16 2033 03/05/16 0441  BP: 116/82 97/64  Pulse: 105 94  Temp: 98 F (36.7 C) 97.5 F (36.4 C)  Resp: 18 14    Filed Weights   02/28/16 1404 02/29/16 0106  Weight: 62.596 kg (138 lb) 67.405 kg (148 lb 9.6 oz)    ROS: Review of Systems  Constitutional: Negative for fever and chills.  Eyes: Negative for blurred vision.  Respiratory: Negative for cough and shortness of breath.   Cardiovascular: Negative for chest pain.  Gastrointestinal: Positive for nausea and abdominal pain. Negative for vomiting, diarrhea and constipation.  Genitourinary: Negative for dysuria.  Musculoskeletal: Negative for joint pain.  Neurological: Negative for dizziness and headaches.   Exam: Physical Exam  Constitutional: She is oriented to person, place, and time.  HENT:  Nose: No mucosal edema.  Mouth/Throat: No oropharyngeal exudate or posterior oropharyngeal edema.  Eyes: Conjunctivae, EOM and lids are normal. Pupils are equal, round, and reactive to light.  Neck: No JVD present. Carotid bruit is not present. No edema present. No thyroid mass and no thyromegaly present.  Cardiovascular: S1 normal and S2 normal.  Exam reveals no gallop.   No murmur heard. Pulses:      Dorsalis pedis pulses are 2+ on the right side, and 2+ on the left side.  Respiratory: No respiratory distress. She has no wheezes. She has no rhonchi. She has no rales.  GI: Soft. Bowel sounds are normal. There is tenderness in the epigastric area.  Musculoskeletal:       Right  ankle: She exhibits no swelling.       Left ankle: She exhibits no swelling.  Lymphadenopathy:    She has no cervical adenopathy.  Neurological: She is alert and oriented to person, place, and time. No cranial nerve deficit.  Skin: Skin is warm. No rash noted. Nails show no clubbing.  Psychiatric: She has a normal mood and affect.      Data Reviewed: Basic Metabolic Panel:  Recent Labs Lab 02/28/16 1408 02/29/16 0617 03/03/16 0446  NA 137 137  --   K 3.5 4.0  --   CL 104 107  --   CO2 26 23  --   GLUCOSE 98 154*  --   BUN 17 10  --   CREATININE 0.68 0.52 0.62  CALCIUM 9.6 8.6*  --    Liver Function Tests:  Recent Labs Lab 02/28/16 1408  AST 20  ALT 11*  ALKPHOS 67  BILITOT 0.4  PROT 7.3  ALBUMIN 4.4    Recent Labs Lab 02/28/16 1408  LIPASE 23   CBC:  Recent Labs Lab 02/28/16 1408 02/29/16 0617  WBC 11.0 8.6  HGB 14.5 13.5  HCT 42.9 40.0  MCV 85.5 85.5  PLT 157 144*     Recent Results (from the past 240 hour(s))  Rapid strep screen     Status: None   Collection Time: 02/24/16  8:55 PM  Result Value Ref Range Status   Streptococcus, Group A Screen (Direct) NEGATIVE NEGATIVE Final    Comment: (NOTE) A Rapid Antigen test  may result negative if the antigen level in the sample is below the detection level of this test. The FDA has not cleared this test as a stand-alone test therefore the rapid antigen negative result has reflexed to a Group A Strep culture.   Culture, group A strep     Status: None (Preliminary result)   Collection Time: 02/24/16  8:55 PM  Result Value Ref Range Status   Specimen Description THROAT  Final   Special Requests NONE Reflexed from [id number]  Final   Culture TOO YOUNG TO READ  Final   Report Status PENDING  Incomplete  Urine culture     Status: None   Collection Time: 02/29/16  5:30 AM  Result Value Ref Range Status   Specimen Description URINE, RANDOM  Final   Special Requests NONE  Final   Culture NO  GROWTH Performed at [facility]   Final   Report Status 03/01/2016 FINAL  Final      Scheduled Meds: . alprazolam  2 mg Oral BID  . amphetamine-dextroamphetamine  30 mg Oral Daily  . calcium carbonate  1 tablet Oral BID  . dicyclomine  10 mg Oral TID AC & HS  . docusate sodium  100 mg Oral BID  . enoxaparin (LOVENOX) injection  40 mg Subcutaneous Q24H  . feeding supplement  1 Container Oral BID BM  . fluticasone  1 spray Each Nare Daily  . gi cocktail  30 mL Oral QHS  . nicotine  21 mg Transdermal Daily  . pantoprazole  40 mg Oral BID  . PARoxetine  25 mg Oral Daily  . polyethylene glycol  17 g Oral Daily  . sucralfate  1 g Oral TID WC & HS    Assessment/Plan:  1. Gastroesophageal reflux disease with out esophagitis. Patient on oral Protonix and Carafate. I added a GI cocktail. No gastrointestinal coverage over the weekend Or Monday. 2. Acute colitis seen on CT scan. Patient not having diarrhea. Sometimes radiologist over read CAT scan on colitis. Patient received 5 days of Cipro and Flagyl. I will stop these medications because he couldn't worsen the gastroesophageal reflux disease. 3. Left lower quadrant pain. Patient has a hemorrhagic cyst 4. Anxiety depression on Paxil 5. Constipation- improved with medications yesterday  Patient is scared to go home. I will contact GI on Monday even though there is no coverage and try to set up a plan. I advised her to stop drinking soda.  Code Status:     Code Status Orders        Start     Ordered   02/29/16 0051  Full code   Continuous     02/29/16 0050    Code Status History    Date Active Date Inactive Code Status Order ID Comments User Context   This patient has a current code status but no historical code status.     Disposition Plan: Unclear when I can send her home.   Time spent: 25 minutes  Alford Highland  [employer]

## 2016-03-06 ENCOUNTER — Inpatient Hospital Stay: Payer: Medicaid Other | Admitting: Anesthesiology

## 2016-03-06 ENCOUNTER — Ambulatory Visit: Admit: 2016-03-06 | Payer: Self-pay | Admitting: Unknown Physician Specialty

## 2016-03-06 ENCOUNTER — Encounter
Admission: EM | Disposition: A | Discharge status: Home / Self Care | Payer: Self-pay | Source: Home / Self Care | Attending: Internal Medicine

## 2016-03-06 ENCOUNTER — Encounter: Payer: Self-pay | Admitting: Anesthesiology

## 2016-03-06 HISTORY — PX: ESOPHAGOGASTRODUODENOSCOPY: SHX5428

## 2016-03-06 LAB — GLUCOSE, CAPILLARY: GLUCOSE-CAPILLARY: 127 mg/dL — AB (ref 65–99)

## 2016-03-06 LAB — CBC
HCT: 40.9 % (ref 35.0–47.0)
HEMOGLOBIN: 13.7 g/dL (ref 12.0–16.0)
MCH: 29.4 pg (ref 26.0–34.0)
MCHC: 33.6 g/dL (ref 32.0–36.0)
MCV: 87.5 fL (ref 80.0–100.0)
Platelets: 179 10*3/uL (ref 150–440)
RBC: 4.67 MIL/uL (ref 3.80–5.20)
RDW: 12.8 % (ref 11.5–14.5)
WBC: 5.2 10*3/uL (ref 3.6–11.0)

## 2016-03-06 LAB — BASIC METABOLIC PANEL
Anion gap: 5 (ref 5–15)
BUN: 8 mg/dL (ref 6–20)
CHLORIDE: 104 mmol/L (ref 101–111)
CO2: 28 mmol/L (ref 22–32)
CREATININE: 0.65 mg/dL (ref 0.44–1.00)
Calcium: 9.1 mg/dL (ref 8.9–10.3)
GFR calc non Af Amer: >60 mL/min (ref >60)
Glucose, Bld: 98 mg/dL (ref 65–99)
POTASSIUM: 3.4 mmol/L — AB (ref 3.5–5.1)
SODIUM: 137 mmol/L (ref 135–145)

## 2016-03-06 SURGERY — EGD (ESOPHAGOGASTRODUODENOSCOPY)
Anesthesia: General

## 2016-03-06 MED ORDER — PROPOFOL 10 MG/ML IV BOLUS
INTRAVENOUS | Status: DC | PRN
  Administered 2016-03-06: 100 mg via INTRAVENOUS

## 2016-03-06 MED ORDER — FENTANYL CITRATE (PF) 100 MCG/2ML IJ SOLN
INTRAMUSCULAR | Status: DC | PRN
  Administered 2016-03-06: 50 ug via INTRAVENOUS

## 2016-03-06 MED ORDER — POTASSIUM CHLORIDE 20 MEQ PO PACK
40.0000 meq | PACK | Freq: Once | ORAL | Status: AC
  Administered 2016-03-06: 40 meq via ORAL
  Filled 2016-03-06: qty 2

## 2016-03-06 MED ORDER — MIDAZOLAM HCL 5 MG/5ML IJ SOLN
INTRAMUSCULAR | Status: DC | PRN
  Administered 2016-03-06: 2 mg via INTRAVENOUS

## 2016-03-06 MED ORDER — SODIUM CHLORIDE 0.9 % IV SOLN
INTRAVENOUS | Status: DC
  Administered 2016-03-06 (×2): via INTRAVENOUS

## 2016-03-06 NOTE — Progress Notes (Signed)
   03/06/16 0920  Clinical Encounter Type  Visited With Patient  Visit Type Spiritual support  Referral From Other (Comment) (Patient submitted a prayer request.  )  Consult/Referral To None  Recommendations Spiritual Care Follow Up  Spiritual Encounters  Spiritual Needs Prayer;Emotional  Stress Factors  Patient Stress Factors Health changes;Financial concerns  Family Stress Factors Financial concerns  Patient would benefit from a follow up from Spiritual Care.

## 2016-03-06 NOTE — Consult Note (Signed)
GI Inpatient Consult Note  Reason for Consult: Abdominal pain, nausea, and vomiting   Attending Requesting Consult: Dr. Renae Gloss  History of Present Illness: Vicki Dominguez is a 30 y.o. female with a history of arthritis, migraines, GERD, and anxiety admitted to [facility] on 02/28/2016 for abdominal pain.  Patient reported a 4 day history of abdominal pain, nausea, vomiting, and diarrhea.  She was evaluated at an urgent care and treated conservatively for viral gastroenteritis.  When symptoms did not improve, she sought ED evaluation.  Labs including CBC, CMP, lipase, UA, and UPT returned returned WNL.  CT a/p w/ contrast demonstrated colitis involving the descending colon, as well as a complicated 5cm L ovarian cyst w/ adjacent fluid and peripheral enhancement (endometrioma vs hemorrhagic cyst).  TVUS, pelvic US with doppler was negative for torsion.  She was admitted for further management, including Cipro and Flagyl, IV antiemetics, and IV pain control.   Patient reports lower abdominal pain continues, as well as new epigastric burning pain since 03/03/16.  It initially improved after a GI cocktail, but returned shortly thereafter.  Associated symptoms include decreased oral intake, acid reflux, bloating, "gurgling", and new constipation; she denies further diarrhea since admission.  Symptoms did not respond to Zantac, Pepid,  Protonix daily or BID, Carafate, or GI cocktail.  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  She also takes Phenergan "a few times per week" for intermittent nausea.  Phenergan was prescribed by her psychiatrist several years ago.  Additionally, patient smokes cigarettes 1/2 ppd and drinks EtOH socially. No h/o H pylori.  Patient also denies fevers, chills, dysphagia, hematochezia, and melena.  Past Medical History:  Past Medical History  Diagnosis Date  . Arthritis   . TMJ arthritis   . Anxiety   . GERD (gastroesophageal reflux disease)    . Migraine     Problem List: Patient Active Problem List   Diagnosis Date Noted  . Acute colitis 02/28/2016  . Anxiety 02/28/2016  . GERD (gastroesophageal reflux disease) 02/28/2016  . Migraine 02/28/2016    Past Surgical History: Past Surgical History  Procedure Laterality Date  . Temporomandibular joint surgery      Allergies: No Known Allergies  Home Medications: Prescriptions prior to admission  Medication Sig Dispense Refill Last Dose  . ADDERALL XR 30 MG 24 hr capsule Take 30 mg by mouth every morning.  0 02/28/2016 at Unknown time  . alprazolam (XANAX) 2 MG tablet Take 2 mg by mouth 2 (two) times daily.  4 02/28/2016 at Unknown time  . aspirin-acetaminophen-caffeine (EXCEDRIN MIGRAINE) 250-250-65 MG tablet Take 1-2 tablets by mouth every 6 (six) hours as needed for headache.   prn at prn  . bismuth subsalicylate (PEPTO-BISMOL) 262 MG/15ML suspension 2 tablespoon or one double strength tablet 3 times a day with Zantac for the next 3-5 days 473 mL 0 02/28/2016 at Unknown time  . ibuprofen (ADVIL,MOTRIN) 200 MG tablet Take 200 mg by mouth every 6 (six) hours as needed.   02/28/2016 at Unknown time  . ondansetron (ZOFRAN ODT) 8 MG disintegrating tablet Take 1 tablet (8 mg total) by mouth every 8 (eight) hours as needed for nausea or vomiting. 20 tablet 0 02/28/2016 at Unknown time  . PARoxetine (PAXIL-CR) 25 MG 24 hr tablet Take 25 mg by mouth daily.  4 02/28/2016 at Unknown time  . ranitidine (ZANTAC) 150 MG capsule 1 capsule 3 times a day for the next 3-5 days, take the Pepto-Bismol 15 capsule  0 02/28/2016 at Unknown time   Home medication reconciliation was completed with the patient.   Scheduled Inpatient Medications:   . alprazolam  2 mg Oral BID  . amphetamine-dextroamphetamine  30 mg Oral Daily  . calcium carbonate  1 tablet Oral BID  . dicyclomine  10 mg Oral TID AC & HS  . docusate sodium  100 mg Oral BID  . enoxaparin (LOVENOX) injection  40 mg Subcutaneous Q24H  . feeding  supplement  1 Container Oral BID BM  . fluticasone  1 spray Each Nare Daily  . gi cocktail  30 mL Oral QHS  . nicotine  21 mg Transdermal Daily  . pantoprazole  40 mg Oral BID  . PARoxetine  25 mg Oral Daily  . polyethylene glycol  17 g Oral Daily  . sucralfate  1 g Oral TID WC & HS    Continuous Inpatient Infusions:     PRN Inpatient Medications:  acetaminophen **OR** acetaminophen, alum & mag hydroxide-simeth, diphenhydrAMINE, guaiFENesin-dextromethorphan, ondansetron **OR** [DISCONTINUED] ondansetron (ZOFRAN) IV, oxyCODONE, oxyCODONE  Family History: family history includes Heart attack in her mother; Hypertension in her father.   Social History:   reports that she has been smoking Cigarettes.  She has been smoking about 0.50 packs per day. She does not have any smokeless tobacco history on file. She reports that she drinks alcohol. She reports that she does not use illicit drugs.   Review of Systems: Constitutional: Weight is stable.  Eyes: No changes in vision. ENT: No oral lesions, sore throat.  GI: see HPI.  Heme/Lymph: No easy bruising.  CV: No chest pain.  GU: No hematuria.  Integumentary: No rashes.  Neuro: No headaches.  Psych: No depression/anxiety.  Endocrine: No heat/cold intolerance.  Allergic/Immunologic: No urticaria.  Resp: No cough, SOB.  Musculoskeletal: No joint swelling.    Physical Examination: BP 109/77 mmHg  Pulse 89  Temp(Src) 97.7 F (36.5 C) (Oral)  Resp 14  Ht 5\' 2"  (1.575 m)  Wt 67.405 kg (148 lb 9.6 oz)  BMI 27.17 kg/m2  SpO2 100%  LMP 02/03/2016 Gen: NAD, alert and oriented x 4 HEENT: PEERLA, EOMI, Neck: supple, no JVD or thyromegaly Chest: CTA bilaterally, no wheezes, crackles, or other adventitious sounds CV: RRR, no m/g/c/r Abd: soft, mild epigastric tenderness to deep palpation, ND, +BS in all four quadrants; no HSM, guarding, ridigity, or rebound tenderness Ext: no edema, well perfused with 2+ pulses, Skin: no rash or  lesions noted Lymph: no LAD  Data: Lab Results  Component Value Date   WBC 5.2 03/06/2016   HGB 13.7 03/06/2016   HCT 40.9 03/06/2016   MCV 87.5 03/06/2016   PLT 179 03/06/2016    Recent Labs Lab 02/28/16 1408 02/29/16 0617 03/06/16 0506  HGB 14.5 13.5 13.7   Lab Results  Component Value Date   NA 137 03/06/2016   K 3.4* 03/06/2016   CL 104 03/06/2016   CO2 28 03/06/2016   BUN 8 03/06/2016   CREATININE 0.65 03/06/2016   Lab Results  Component Value Date   ALT 11* 02/28/2016   AST 20 02/28/2016   ALKPHOS 67 02/28/2016   BILITOT 0.4 02/28/2016   No results for input(s): APTT, INR, PTT in the last 168 hours.   Assessment/Plan: Ms. Alan MulderSenter is a 30 y.o. female with a history of arthritis, migraines, GERD, and anxiety admitted to [facility] on 02/28/2016 for abdominal pain.  The pain was initially in her lower abdomen and accompanied by nausea, vomiting, and diarrhea.  Labs returned normal, but CT a/p demonstrated colitis involving the descending colon as well as a 5 cm L ovarian cyst.  Diarrhea resolved quickly upon receiving Cipro and Flagyl.  However, upper GI symptoms persist refractory to treatment with multiple medications.  Given her daily NSAID use, I recommend EGD to exclude PUD and gastritis.  H pylori infection is a consideration as well.  There may also be a functional component to her symptoms, given daily nausea requiring Phenergan for relief.  Will plan for EGD with Dr. Mechele Collin this afternoon, as patient has remained NPO since midnight.  Further recs regarding EGD per Dr. Mechele Collin.  Recommendations: - EGD this afternoon per Dr. Mechele Collin - Continue conservative management with IV antiemetics and pain control - Further recs following EGD per Dr. Mechele Collin  Thank you for the consult. We will follow along with you. Please call with questions or concerns.  Burman Freestone, PA-C [facility] Phone: [phone] Pager: [phone]

## 2016-03-06 NOTE — Op Note (Signed)
[facility] Patient Name: Vicki Dominguez Procedure Date: 03/06/2016 4:55 PM MRN: [id number] Account #: [id number] Date of Birth: Dec 27, 1985 Admit Type: Outpatient Age: 30 Room: [facility] ENDO ROOM 1 Gender: Female Note Status: Finalized Procedure:            Upper GI endoscopy Indications:          Epigastric abdominal pain, Generalized abdominal pain Providers:            Scot Jun, MD Referring MD:         No Local Md, MD (Referring MD) Medicines:            Propofol per Anesthesia Complications:        No immediate complications. Procedure:            Pre-Anesthesia Assessment:                       - After reviewing the risks and benefits, the patient                        was deemed in satisfactory condition to undergo the                        procedure.                       After obtaining informed consent, the endoscope was                        passed under direct vision. Throughout the procedure,                        the patient's blood pressure, pulse, and oxygen                        saturations were monitored continuously. The Endoscope                        was introduced through the mouth, and advanced to the                        second part of duodenum. The upper GI endoscopy was                        accomplished without difficulty. The patient tolerated                        the procedure well. Findings:      LA Grade A (one or more mucosal breaks less than 5 mm, not extending       between tops of 2 mucosal folds) esophagitis with no bleeding was found       38 cm from the incisors.      Localized and patchy mild inflammation characterized by erythema and       granularity was found in the gastric antrum. Biopsies were taken with a       cold forceps for histology. Biopsies were taken with a cold forceps for       Helicobacter pylori testing.      Patchy mildly erythematous mucosa without bleeding was found in  the       gastric body. Biopsies were taken with  a cold forceps for Helicobacter       pylori testing. Biopsies were taken with a cold forceps for histology.      The examined duodenum was normal. Impression:           - LA Grade A reflux esophagitis.                       - Gastritis. Biopsied.                       - Erythematous mucosa in the gastric body. Biopsied.                       - Normal examined duodenum. Recommendation:       - Await pathology results. Scot Jun, MD 03/06/2016 5:13:40 PM This report has been signed electronically. Number of Addenda: 0 Note Initiated On: 03/06/2016 4:55 PM      [facility]

## 2016-03-06 NOTE — Progress Notes (Signed)
Pt states she doesn't think she is urinating well.  Bladder scanned for 210 cc.  Recommended pt sit in bathroom with warm water running and try to void. Henriette CombsSarah Anniece Bleiler RN

## 2016-03-06 NOTE — Consult Note (Signed)
Patient EGD showed minimal gastritis in antrum and a few faint spots in body of stomach.  Recommend PPI bid and carafate for a month then stop the carafate but continue the PPI.  Await biopsies for H.pylori.

## 2016-03-06 NOTE — Progress Notes (Signed)
Patient ID: Vicki Dominguez Andalon, female   DOB: July 01, 1986, 30 y.o.   MRN: [id number] [employer] PROGRESS NOTE  Vicki Dominguez Dugo WUJ:[id number] DOB: July 01, 1986 DOA: 02/28/2016 [facility]  HPI/Subjective: Patient still not feeling well, cant eat.  She did not eat breakfast this am, drank the potassium.  Still with burning epigastric pain, pain in abdomen diffuse.  Bloated.  Objective: Filed Vitals:   03/05/16 2047 03/06/16 0440  BP: 110/73 109/77  Pulse: 87 89  Temp: 98.3 F (36.8 C) 97.7 F (36.5 C)  Resp: 16 14    Filed Weights   02/28/16 1404 02/29/16 0106  Weight: 62.596 kg (138 lb) 67.405 kg (148 lb 9.6 oz)    ROS: Review of Systems  Constitutional: Negative for fever and chills.  Eyes: Negative for blurred vision.  Respiratory: Negative for cough and shortness of breath.   Cardiovascular: Negative for chest pain.  Gastrointestinal: Positive for nausea and abdominal pain. Negative for vomiting, diarrhea and constipation.  Genitourinary: Negative for dysuria.  Musculoskeletal: Negative for joint pain.  Neurological: Negative for dizziness and headaches.   Exam: Physical Exam  Constitutional: She is oriented to person, place, and time.  HENT:  Nose: No mucosal edema.  Mouth/Throat: No oropharyngeal exudate or posterior oropharyngeal edema.  Eyes: Conjunctivae, EOM and lids are normal. Pupils are equal, round, and reactive to light.  Neck: No JVD present. Carotid bruit is not present. No edema present. No thyroid mass and no thyromegaly present.  Cardiovascular: S1 normal and S2 normal.  Exam reveals no gallop.   No murmur heard. Pulses:      Dorsalis pedis pulses are 2+ on the right side, and 2+ on the left side.  Respiratory: No respiratory distress. She has no wheezes. She has no rhonchi. She has no rales.  GI: Soft. Bowel sounds are normal. There is generalized tenderness.  Musculoskeletal:       Right ankle: She exhibits no swelling.       Left  ankle: She exhibits no swelling.  Lymphadenopathy:    She has no cervical adenopathy.  Neurological: She is alert and oriented to person, place, and time. No cranial nerve deficit.  Skin: Skin is warm. No rash noted. Nails show no clubbing.  Psychiatric: She has a normal mood and affect.      Data Reviewed: Basic Metabolic Panel:  Recent Labs Lab 02/28/16 1408 02/29/16 0617 03/03/16 0446 03/06/16 0506  NA 137 137  --  137  K 3.5 4.0  --  3.4*  CL 104 107  --  104  CO2 26 23  --  28  GLUCOSE 98 154*  --  98  BUN 17 10  --  8  CREATININE 0.68 0.52 0.62 0.65  CALCIUM 9.6 8.6*  --  9.1   Liver Function Tests:  Recent Labs Lab 02/28/16 1408  AST 20  ALT 11*  ALKPHOS 67  BILITOT 0.4  PROT 7.3  ALBUMIN 4.4    Recent Labs Lab 02/28/16 1408  LIPASE 23   CBC:  Recent Labs Lab 02/28/16 1408 02/29/16 0617 03/06/16 0506  WBC 11.0 8.6 5.2  HGB 14.5 13.5 13.7  HCT 42.9 40.0 40.9  MCV 85.5 85.5 87.5  PLT 157 144* 179     Recent Results (from the past 240 hour(s))  Urine culture     Status: None   Collection Time: 02/29/16  5:30 AM  Result Value Ref Range Status   Specimen Description URINE, RANDOM  Final  Special Requests NONE  Final   Culture NO GROWTH Performed at [facility]   Final   Report Status 03/01/2016 FINAL  Final      Scheduled Meds: . alprazolam  2 mg Oral BID  . amphetamine-dextroamphetamine  30 mg Oral Daily  . calcium carbonate  1 tablet Oral BID  . dicyclomine  10 mg Oral TID AC & HS  . docusate sodium  100 mg Oral BID  . enoxaparin (LOVENOX) injection  40 mg Subcutaneous Q24H  . feeding supplement  1 Container Oral BID BM  . fluticasone  1 spray Each Nare Daily  . gi cocktail  30 mL Oral QHS  . nicotine  21 mg Transdermal Daily  . pantoprazole  40 mg Oral BID  . PARoxetine  25 mg Oral Daily  . polyethylene glycol  17 g Oral Daily  . sucralfate  1 g Oral TID WC & HS    Assessment/Plan:  1. Diffuse abdominal pain.  Gastroesophageal reflux disease with out esophagitis. Patient on oral Protonix and Carafate. I added a GI cocktail. Spoke with Dr Markham Jordan to do egd this afternoon.  Patient did not eat breakfast and only ate the potassium replacement. 2. Acute colitis seen on CT scan. Patient not having diarrhea. Stopped abx yesterday 3. Left lower quadrant pain. Patient has a Ovarian hemorrhagic cyst. I spoke with [facility] too see the patient today. 4. Anxiety depression on Paxil 5. Constipation- will get more aggressive with medications after egd    Code Status:     Code Status Orders        Start     Ordered   02/29/16 0051  Full code   Continuous     02/29/16 0050    Code Status History    Date Active Date Inactive Code Status Order ID Comments User Context   This patient has a current code status but no historical code status.     Disposition Plan: TBD  Time spent: 30 minutes  Alford Highland  [employer]

## 2016-03-06 NOTE — Progress Notes (Signed)
Obstetrics & Gynecology Consultation Note  Date of Consultation: 03/06/2016   Requesting Provider: Medicine  Primary OBGYN: None Primary Care Provider: [facility]  Reason for Consultation: Left Ovarian Cyst  History of Present Illness: Vicki Dominguez is a 30 y.o. G2P2 (Patient's last menstrual period was 02/03/2016.), with the above CC. Patient has been having pain all over abdomen but has also noted a LLQ pain that preceded admission by 2 weeks; also has had bloating, nausea.  Prior to admission one week ago she had some diarhea, but no more.  Heartburn is an additional sx.  Pt seen one week ago and admitted for Colitis; [place] at that time revealed 5 cm complex left ovarian cyst.  Pt reports h/o left ovarian cyst 6 years ago during pregnancy and one other occasion, no surgery needed.  Pt uses condoms for birth control as she does not to;erate hormones well (BTB on OCP and Depo).  NSVD x2 (ages 67 and 23).  PAP UTD ([facility]).  ROS: A 12-point review of systems was performed and negative, except as stated in the above HPI.  OBGYN History: As per HPI. OB History    NSVD x2     Menarche age 67  Yes.   history of abnormal pap smears (last pap smear: 2016, which was normal.) no history of STIs.   She is currently using Condoms for contraception.  no HRT use.    Past Medical History: Past Medical History  Diagnosis Date  . Arthritis   . TMJ arthritis   . Anxiety   . GERD (gastroesophageal reflux disease)   . Migraine     Past Surgical History: Past Surgical History  Procedure Laterality Date  . Temporomandibular joint surgery      Family History:  Family History  Problem Relation Age of Onset  . Heart attack Mother   . Hypertension Father    She denies any female cancers, bleeding or blood clotting disorders.   Social History:  Social History   Social History  . Marital Status: Single    Spouse Name: N/A  . Number of Children: N/A  . Years of Education: N/A    Occupational History  . Not on file.   Social History Main Topics  . Smoking status: Current Every Day Smoker -- 0.50 packs/day    Types: Cigarettes  . Smokeless tobacco: Not on file  . Alcohol Use: 0.0 oz/week    0 Standard drinks or equivalent per week     Comment: occasionally  . Drug Use: No  . Sexual Activity: Not on file   Other Topics Concern  . Not on file   Social History Narrative    Health Maintenance:  Mammogram no;  Colonoscopy no,  Flu shot no  Allergy: No Known Allergies  Current Outpatient Medications: Prescriptions prior to admission  Medication Sig Dispense Refill Last Dose  . ADDERALL XR 30 MG 24 hr capsule Take 30 mg by mouth every morning.  0 02/28/2016 at Unknown time  . alprazolam (XANAX) 2 MG tablet Take 2 mg by mouth 2 (two) times daily.  4 02/28/2016 at Unknown time  . aspirin-acetaminophen-caffeine (EXCEDRIN MIGRAINE) 250-250-65 MG tablet Take 1-2 tablets by mouth every 6 (six) hours as needed for headache.   prn at prn  . bismuth subsalicylate (PEPTO-BISMOL) 262 MG/15ML suspension 2 tablespoon or one double strength tablet 3 times a day with Zantac for the next 3-5 days 473 mL 0 02/28/2016 at Unknown time  . ibuprofen (ADVIL,MOTRIN) 200 MG  tablet Take 200 mg by mouth every 6 (six) hours as needed.   02/28/2016 at Unknown time  . ondansetron (ZOFRAN ODT) 8 MG disintegrating tablet Take 1 tablet (8 mg total) by mouth every 8 (eight) hours as needed for nausea or vomiting. 20 tablet 0 02/28/2016 at Unknown time  . PARoxetine (PAXIL-CR) 25 MG 24 hr tablet Take 25 mg by mouth daily.  4 02/28/2016 at Unknown time  . ranitidine (ZANTAC) 150 MG capsule 1 capsule 3 times a day for the next 3-5 days, take the Pepto-Bismol 15 capsule 0 02/28/2016 at Unknown time     Hospital Medications: Current Facility-Administered Medications  Medication Dose Route Frequency Provider Last Rate Last Dose  . acetaminophen (TYLENOL) tablet 650 mg  650 mg Oral Q6H PRN Oralia Manis,  MD   650 mg at 03/04/16 1214   Or  . acetaminophen (TYLENOL) suppository 650 mg  650 mg Rectal Q6H PRN Oralia Manis, MD      . ALPRAZolam Prudy Feeler) tablet 2 mg  2 mg Oral BID Oralia Manis, MD   2 mg at 03/06/16 1013  . alum & mag hydroxide-simeth (MAALOX/MYLANTA) 200-200-20 MG/5ML suspension 30 mL  30 mL Oral Q6H PRN Auburn Bilberry, MD   30 mL at 03/06/16 0132  . amphetamine-dextroamphetamine (ADDERALL XR) 24 hr capsule 30 mg  30 mg Oral Daily Wyatt Haste, MD   30 mg at 03/06/16 1012  . calcium carbonate (TUMS - dosed in mg elemental calcium) chewable tablet 200 mg of elemental calcium  1 tablet Oral BID Alford Highland, MD   200 mg of elemental calcium at 03/06/16 1013  . dicyclomine (BENTYL) capsule 10 mg  10 mg Oral TID AC & HS Wyatt Haste, MD   10 mg at 03/06/16 1610  . diphenhydrAMINE (BENADRYL) capsule 25 mg  25 mg Oral Q6H PRN Wyatt Haste, MD   25 mg at 03/05/16 2144  . docusate sodium (COLACE) capsule 100 mg  100 mg Oral BID Wyatt Haste, MD   100 mg at 03/06/16 1012  . enoxaparin (LOVENOX) injection 40 mg  40 mg Subcutaneous Q24H Alford Highland, MD   40 mg at 03/06/16 1012  . feeding supplement (BOOST / RESOURCE BREEZE) liquid 1 Container  1 Container Oral BID BM Wyatt Haste, MD   Stopped at 03/06/16 1000  . fluticasone (FLONASE) 50 MCG/ACT nasal spray 1 spray  1 spray Each Nare Daily Auburn Bilberry, MD   1 spray at 03/06/16 1000  . gi cocktail (Maalox,Lidocaine,Donnatal)  30 mL Oral QHS Alford Highland, MD   30 mL at 03/05/16 2144  . guaiFENesin-dextromethorphan (ROBITUSSIN DM) 100-10 MG/5ML syrup 5 mL  5 mL Oral Q4H PRN Wyatt Haste, MD   5 mL at 03/04/16 1928  . nicotine (NICODERM CQ - dosed in mg/24 hours) patch 21 mg  21 mg Transdermal Daily Wyatt Haste, MD   21 mg at 03/05/16 1031  . ondansetron (ZOFRAN) tablet 4 mg  4 mg Oral Q4H PRN Wyatt Haste, MD   4 mg at 03/06/16 9604  . oxyCODONE (Oxy IR/ROXICODONE) immediate release tablet 10 mg  10 mg Oral Q4H PRN Wyatt Haste,  MD   10 mg at 03/06/16 5409  . oxyCODONE (Oxy IR/ROXICODONE) immediate release tablet 5 mg  5 mg Oral Q4H PRN Wyatt Haste, MD   5 mg at 03/05/16 0758  . pantoprazole (PROTONIX) EC tablet 40 mg  40 mg Oral BID Wyatt Haste, MD  40 mg at 03/06/16 1013  . PARoxetine (PAXIL-CR) 24 hr tablet 25 mg  25 mg Oral Daily Oralia Manis, MD   25 mg at 03/06/16 1012  . polyethylene glycol (MIRALAX / GLYCOLAX) packet 17 g  17 g Oral Daily Wyatt Haste, MD   Stopped at 03/06/16 1000  . sucralfate (CARAFATE) 1 GM/10ML suspension 1 g  1 g Oral TID WC & HS Wyatt Haste, MD   1 g at 03/06/16 0833     Physical Exam: Filed Vitals:   03/05/16 0441 03/05/16 1504 03/05/16 2047 03/06/16 0440  BP: 97/64 102/67 110/73 109/77  Pulse: 94 99 87 89  Temp: 97.5 F (36.4 C) 98.8 F (37.1 C) 98.3 F (36.8 C) 97.7 F (36.5 C)  TempSrc: Oral Oral Oral Oral  Resp: 14 20 16 14   Height:      Weight:      SpO2: 98% 100% 100% 100%    Temp:  [97.7 F (36.5 C)-98.8 F (37.1 C)] 97.7 F (36.5 C) (06/12 0440) Pulse Rate:  [87-99] 89 (06/12 0440) Resp:  [14-20] 14 (06/12 0440) BP: (102-110)/(67-77) 109/77 mmHg (06/12 0440) SpO2:  [100 %] 100 % (06/12 0440) I/O last 3 completed shifts: In: 726 [P.O.:726] Out: -    No intake or output data in the 24 hours ending 03/06/16 1101   Current Vital Signs 24h Vital Sign Ranges  T 97.7 F (36.5 C) Temp  Avg: 98.3 F (36.8 C)  Min: 97.7 F (36.5 C)  Max: 98.8 F (37.1 C)  BP 109/77 mmHg BP  Min: 102/67  Max: 110/73  HR 89 Pulse  Avg: 91.7  Min: 87  Max: 99  RR 14 Resp  Avg: 16.7  Min: 14  Max: 20  SaO2 100 % Not Delivered SpO2  Avg: 100 %  Min: 100 %  Max: 100 %       24 Hour I/O Current Shift I/O  Time Ins Outs 06/11 0701 - 06/12 0700 In: 240 [P.O.:240] Out: -      Patient Vitals for the past 8 hrs:  BP Temp Temp src Pulse Resp SpO2  03/06/16 0440 109/77 mmHg 97.7 F (36.5 C) Oral 89 14 100 %    Body mass index is 27.17 kg/(m^2). General appearance: Well  nourished, well developed female in no acute distress.  Neck:  Supple, normal appearance, and no thyromegaly  Cardiovascular:Regular rate and rhythm.  No murmurs, rubs or gallops. Respiratory:  Clear to auscultation bilateral. Normal respiratory effort Abdomen: positive bowel sounds and no masses, hernias; diffusely non tender to palpation, non distended Neuro/Psych:  Normal mood and affect.  Skin:  Warm and dry.  Extr: no edema or calf T Lymphatic:  No inguinal lymphadenopathy.    Recent Labs Lab 02/28/16 1408 02/29/16 0617 03/06/16 0506  WBC 11.0 8.6 5.2  HGB 14.5 13.5 13.7  HCT 42.9 40.0 40.9  PLT 157 144* 179    Recent Labs Lab 02/28/16 1408 02/29/16 0617 03/03/16 0446 03/06/16 0506  NA 137 137  --  137  K 3.5 4.0  --  3.4*  CL 104 107  --  104  CO2 26 23  --  28  BUN 17 10  --  8  CREATININE 0.68 0.52 0.62 0.65  CALCIUM 9.6 8.6*  --  9.1  PROT 7.3  --   --   --   BILITOT 0.4  --   --   --   ALKPHOS 67  --   --   --  ALT 11*  --   --   --   AST 20  --   --   --   GLUCOSE 98 154*  --  98   Imaging:  TVUS:  CLINICAL DATA: Left lower quadrant pain. History of ovarian cysts.  EXAM: TRANSABDOMINAL AND TRANSVAGINAL ULTRASOUND OF PELVIS  DOPPLER ULTRASOUND OF OVARIES  TECHNIQUE: Both transabdominal and transvaginal ultrasound examinations of the pelvis were performed. Transabdominal technique was performed for global imaging of the pelvis including uterus, ovaries, adnexal regions, and pelvic cul-de-sac.  It was necessary to proceed with endovaginal exam following the transabdominal exam to visualize the ovaries and endometrium. Color and duplex Doppler ultrasound was utilized to evaluate blood flow to the ovaries.  COMPARISON: Abdominal CT from earlier today.  FINDINGS: Uterus  Measurements: 9 x 4 x 6 cm. No fibroids or other mass visualized.  Endometrium  Thickness: 6 mm. No focal abnormality visualized.  Right  ovary  Measurements: 36 x 19 x 26 mm. Normal appearance/no adnexal mass.  Left ovary  Measurements: 50 x 44 x 15 mm. Asymmetric enlargement secondary to a 47 mm mass with no internal color Doppler flow and diffuse mid level echoes. No associated shadowing or bright spectral reflectors. Along the upper margin there may be superimposed fine septations.  Pulsed Doppler evaluation of both ovaries demonstrates normal low-resistance arterial and venous waveforms.  Other findings  Small volume pelvic fluid which could be physiologic or reactive to patient's colitis.  IMPRESSION: 1. No indication of ovarian torsion. 2. 5 cm left ovarian mass favoring endometrioma or hemorrhagic cyst. Short-interval follow up ultrasound in 6-12 weeks is recommended, preferably during the week following the patient's normal menses.   Electronically Signed  By: Marnee SpringJonathon Watts M.D.  On: 02/28/2016 21:45  Assessment: Vicki Dominguez is a 30 y.o. G2P2 (Patient's last menstrual period was 02/03/2016.) who presented to the ED with complaints of diffuse as well as LLQ pain and bloating/nausea; findings are consistent with left complex ovarian cyst (hemorrhagic or endometrioma are possibilities).  Pain and sx's may or may not be related to this cyst and this is all explained to the patient.  She also is being treated for colitis and has an upper GI study today as well.  Counseled that cysts can cause pain, bloating, nausea; but they can also be asymptomatic.  Risk of torsion at 5 cm is considered low, but the risk rises as the cyst grows beyond 6-8 cm.  Options for cystectomy by laparoscopy discussed; it would remove the cyst and then sx's could be followed for resolution vs persistence, which may help direct future care/treatments (colitis or otherwise).  Expectant management is fine as well, with repeat US in 6 weeks to assess for growth or resolution.    Plan: Plan to schedule for Laparoscopy this week for  excision of cyst (possible ovary, but risk for this is low).   She will need to be NPO after MN and have consent signed (informed consent by me today). Will discuss case with Dr Jean RosenthalJackson as he will be on service tomorrow.  Annamarie MajorPaul Lacrisha Bielicki, MD [facility] Pager [phone]

## 2016-03-06 NOTE — Transfer of Care (Signed)
Immediate Anesthesia Transfer of Care Note  Patient: Vicki Dominguez  Procedure(s) Performed: Procedure(s): ESOPHAGOGASTRODUODENOSCOPY (EGD) (N/A)  Patient Location: PACU and Endoscopy Unit  Anesthesia Type:General  Level of Consciousness: sedated  Airway & Oxygen Therapy: Patient Spontanous Breathing and Patient connected to nasal cannula oxygen  Post-op Assessment: Report given to RN and Post -op Vital signs reviewed and stable  Post vital signs: Reviewed and stable  Last Vitals:  Filed Vitals:   03/06/16 1559 03/06/16 1718  BP: 115/84 93/65  Pulse: 107 96  Temp: 37 C 36.1 C  Resp: 18 12    Complications: No apparent anesthesia complications

## 2016-03-06 NOTE — Anesthesia Preprocedure Evaluation (Signed)
Anesthesia Evaluation  Patient identified by MRN, date of birth, ID band Patient awake    Reviewed: Allergy & Precautions, H&P , NPO status , Patient's Chart, lab work & pertinent test results, reviewed documented beta blocker date and time   History of Anesthesia Complications (+) Family history of anesthesia reaction and history of anesthetic complications  Airway Mallampati: II  TM Distance: >3 FB Neck ROM: full    Dental no notable dental hx. (+) Teeth Intact, Missing   Pulmonary neg shortness of breath, neg sleep apnea, neg COPD, neg recent URI, Current Smoker,    Pulmonary exam normal breath sounds clear to auscultation       Cardiovascular Exercise Tolerance: Good negative cardio ROS Normal cardiovascular exam Rhythm:regular Rate:Normal     Neuro/Psych PSYCHIATRIC DISORDERS (Depression, anxiety, ADHD) negative neurological ROS     GI/Hepatic Neg liver ROS, GERD  ,  Endo/Other  negative endocrine ROS  Renal/GU negative Renal ROS  negative genitourinary   Musculoskeletal   Abdominal   Peds  Hematology negative hematology ROS (+)   Anesthesia Other Findings Past Medical History:   Arthritis                                                    TMJ arthritis                                                Anxiety                                                      GERD (gastroesophageal reflux disease)                       Migraine                                                     Reproductive/Obstetrics negative OB ROS                             Anesthesia Physical Anesthesia Plan  ASA: II  Anesthesia Plan: General   Post-op Pain Management:    Induction:   Airway Management Planned:   Additional Equipment:   Intra-op Plan:   Post-operative Plan:   Informed Consent: I have reviewed the patients History and Physical, chart, labs and discussed the procedure including  the risks, benefits and alternatives for the proposed anesthesia with the patient or authorized representative who has indicated his/her understanding and acceptance.   Dental Advisory Given  Plan Discussed with: Anesthesiologist, CRNA and Surgeon  Anesthesia Plan Comments:         Anesthesia Quick Evaluation

## 2016-03-06 NOTE — Anesthesia Postprocedure Evaluation (Signed)
Anesthesia Post Note  Patient: Vicki Dominguez  Procedure(s) Performed: Procedure(s) (LRB): ESOPHAGOGASTRODUODENOSCOPY (EGD) (N/A)  Patient location during evaluation: Endoscopy Anesthesia Type: General Level of consciousness: awake and alert Pain management: pain level controlled Vital Signs Assessment: post-procedure vital signs reviewed and stable Respiratory status: spontaneous breathing, nonlabored ventilation, respiratory function stable and patient connected to nasal cannula oxygen Cardiovascular status: blood pressure returned to baseline and stable Postop Assessment: no signs of nausea or vomiting Anesthetic complications: no    Last Vitals:  Filed Vitals:   03/06/16 1738 03/06/16 1748  BP: 95/64 93/69  Pulse: 83 91  Temp:    Resp: 12 12    Last Pain:  Filed Vitals:   03/06/16 1749  PainSc: 9                  Lenard SimmerAndrew Refugio Mcconico

## 2016-03-07 ENCOUNTER — Encounter
Admission: EM | Disposition: A | Discharge status: Home / Self Care | Payer: Self-pay | Source: Home / Self Care | Attending: Internal Medicine

## 2016-03-07 ENCOUNTER — Inpatient Hospital Stay: Payer: Medicaid Other | Admitting: Anesthesiology

## 2016-03-07 ENCOUNTER — Encounter: Payer: Self-pay | Admitting: *Deleted

## 2016-03-07 HISTORY — PX: LAPAROSCOPIC OVARIAN CYSTECTOMY: SHX6248

## 2016-03-07 LAB — SURGICAL PCR SCREEN
MRSA, PCR: NEGATIVE
Staphylococcus aureus: NEGATIVE

## 2016-03-07 LAB — PREGNANCY, URINE: Preg Test, Ur: NEGATIVE

## 2016-03-07 SURGERY — EXCISION, CYST, OVARY, LAPAROSCOPIC
Anesthesia: General | Site: Uterus | Laterality: Left | Wound class: Clean

## 2016-03-07 MED ORDER — HYDROMORPHONE HCL 1 MG/ML IJ SOLN
INTRAMUSCULAR | Status: AC
  Administered 2016-03-07: 0.5 mg via INTRAVENOUS
  Filled 2016-03-07: qty 1

## 2016-03-07 MED ORDER — FENTANYL CITRATE (PF) 100 MCG/2ML IJ SOLN
INTRAMUSCULAR | Status: DC | PRN
  Administered 2016-03-07 (×2): 50 ug via INTRAVENOUS
  Administered 2016-03-07: 100 ug via INTRAVENOUS

## 2016-03-07 MED ORDER — MENTHOL 3 MG MT LOZG
1.0000 | LOZENGE | OROMUCOSAL | Status: DC | PRN
  Filled 2016-03-07: qty 9

## 2016-03-07 MED ORDER — ONDANSETRON HCL 4 MG/2ML IJ SOLN
4.0000 mg | Freq: Four times a day (QID) | INTRAMUSCULAR | Status: DC | PRN
  Administered 2016-03-07: 4 mg via INTRAVENOUS
  Filled 2016-03-07: qty 2

## 2016-03-07 MED ORDER — SUGAMMADEX SODIUM 200 MG/2ML IV SOLN
INTRAVENOUS | Status: DC | PRN
  Administered 2016-03-07: 134.2 mg via INTRAVENOUS

## 2016-03-07 MED ORDER — SODIUM CHLORIDE 0.9 % IV SOLN
INTRAVENOUS | Status: DC
  Administered 2016-03-07 – 2016-03-08 (×3): via INTRAVENOUS

## 2016-03-07 MED ORDER — HYDROMORPHONE HCL 1 MG/ML IJ SOLN
0.5000 mg | INTRAMUSCULAR | Status: AC | PRN
  Administered 2016-03-07 (×4): 0.5 mg via INTRAVENOUS

## 2016-03-07 MED ORDER — LIDOCAINE HCL (CARDIAC) 20 MG/ML IV SOLN
INTRAVENOUS | Status: DC | PRN
  Administered 2016-03-07: 60 mg via INTRAVENOUS

## 2016-03-07 MED ORDER — BUPIVACAINE HCL 0.5 % IJ SOLN
INTRAMUSCULAR | Status: DC | PRN
  Administered 2016-03-07: 30 mL

## 2016-03-07 MED ORDER — ONDANSETRON HCL 4 MG/2ML IJ SOLN
INTRAMUSCULAR | Status: DC | PRN
  Administered 2016-03-07: 4 mg via INTRAVENOUS

## 2016-03-07 MED ORDER — ONDANSETRON HCL 4 MG/2ML IJ SOLN
4.0000 mg | Freq: Four times a day (QID) | INTRAMUSCULAR | Status: DC | PRN
  Administered 2016-03-08: 4 mg via INTRAVENOUS
  Filled 2016-03-07: qty 2

## 2016-03-07 MED ORDER — DEXAMETHASONE SODIUM PHOSPHATE 10 MG/ML IJ SOLN
INTRAMUSCULAR | Status: DC | PRN
  Administered 2016-03-07: 10 mg via INTRAVENOUS

## 2016-03-07 MED ORDER — ROCURONIUM BROMIDE 100 MG/10ML IV SOLN
INTRAVENOUS | Status: DC | PRN
  Administered 2016-03-07: 30 mg via INTRAVENOUS

## 2016-03-07 MED ORDER — HYDROMORPHONE HCL 1 MG/ML IJ SOLN
1.0000 mg | INTRAMUSCULAR | Status: DC | PRN
  Administered 2016-03-07 – 2016-03-08 (×4): 1 mg via INTRAVENOUS
  Filled 2016-03-07 (×4): qty 1

## 2016-03-07 MED ORDER — FENTANYL CITRATE (PF) 100 MCG/2ML IJ SOLN
INTRAMUSCULAR | Status: AC
  Administered 2016-03-07: 25 ug via INTRAVENOUS
  Filled 2016-03-07: qty 2

## 2016-03-07 MED ORDER — ONDANSETRON HCL 4 MG PO TABS: 4.0000 mg | ORAL_TABLET | Freq: Four times a day (QID) | ORAL | Status: DC | PRN

## 2016-03-07 MED ORDER — FENTANYL CITRATE (PF) 100 MCG/2ML IJ SOLN
25.0000 ug | INTRAMUSCULAR | Status: AC | PRN
  Administered 2016-03-07 (×6): 25 ug via INTRAVENOUS

## 2016-03-07 MED ORDER — ONDANSETRON HCL 4 MG/2ML IJ SOLN: 4.0000 mg | Freq: Once | INTRAMUSCULAR | Status: DC | PRN

## 2016-03-07 MED ORDER — OXYCODONE-ACETAMINOPHEN 5-325 MG PO TABS
1.0000 | ORAL_TABLET | ORAL | Status: DC | PRN
  Administered 2016-03-07 – 2016-03-08 (×5): 2 via ORAL
  Filled 2016-03-07 (×5): qty 2

## 2016-03-07 MED ORDER — POLYVINYL ALCOHOL 1.4 % OP SOLN
1.0000 [drp] | OPHTHALMIC | Status: DC | PRN
  Administered 2016-03-07 – 2016-03-08 (×3): 1 [drp] via OPHTHALMIC
  Filled 2016-03-07: qty 15

## 2016-03-07 MED ORDER — BUPIVACAINE HCL (PF) 0.5 % IJ SOLN
INTRAMUSCULAR | Status: AC
  Filled 2016-03-07: qty 30

## 2016-03-07 MED ORDER — IBUPROFEN 600 MG PO TABS: 600.0000 mg | ORAL_TABLET | Freq: Four times a day (QID) | ORAL | Status: DC | PRN

## 2016-03-07 MED ORDER — MIDAZOLAM HCL 5 MG/5ML IJ SOLN
INTRAMUSCULAR | Status: DC | PRN
  Administered 2016-03-07: 2 mg via INTRAVENOUS

## 2016-03-07 MED ORDER — PROPOFOL 10 MG/ML IV BOLUS
INTRAVENOUS | Status: DC | PRN
  Administered 2016-03-07: 200 mg via INTRAVENOUS

## 2016-03-07 MED ORDER — HEMOSTATIC AGENTS (NO CHARGE) OPTIME
TOPICAL | Status: DC | PRN
  Administered 2016-03-07: 1 via TOPICAL

## 2016-03-07 SURGICAL SUPPLY — 46 items
BLADE SURG SZ11 CARB STEEL (BLADE) ×3 IMPLANT
CANISTER SUCT 1200ML W/VALVE (MISCELLANEOUS) ×3 IMPLANT
CATH ROBINSON RED A/P 16FR (CATHETERS) ×3 IMPLANT
CHLORAPREP W/TINT 26ML (MISCELLANEOUS) ×3 IMPLANT
DRAPE LEGGINS SURG 28X43 STRL (DRAPES) ×3 IMPLANT
DRAPE SHEET LG 3/4 BI-LAMINATE (DRAPES) ×3 IMPLANT
DRAPE UNDER BUTTOCK W/FLU (DRAPES) ×3 IMPLANT
DRESSING SURGICEL FIBRLLR 1X2 (HEMOSTASIS) ×1 IMPLANT
DRESSING TELFA 4X3 1S ST N-ADH (GAUZE/BANDAGES/DRESSINGS) ×3 IMPLANT
DRSG SURGICEL FIBRILLAR 1X2 (HEMOSTASIS) ×3
ELECT REM PT RETURN 9FT ADLT (ELECTROSURGICAL) ×3
ELECTRODE REM PT RTRN 9FT ADLT (ELECTROSURGICAL) ×1 IMPLANT
ENDOPOUCH RETRIEVER 10 (MISCELLANEOUS) ×3 IMPLANT
GLOVE BIO SURGEON STRL SZ7 (GLOVE) ×6 IMPLANT
GLOVE BIOGEL PI IND STRL 7.5 (GLOVE) ×1 IMPLANT
GLOVE BIOGEL PI INDICATOR 7.5 (GLOVE) ×2
GOWN STRL REUS W/ TWL LRG LVL3 (GOWN DISPOSABLE) ×2 IMPLANT
GOWN STRL REUS W/TWL LRG LVL3 (GOWN DISPOSABLE) ×4
GRASPER SUT TROCAR 14GX15 (MISCELLANEOUS) ×3 IMPLANT
IRRIGATION STRYKERFLOW (MISCELLANEOUS) ×1 IMPLANT
IRRIGATOR STRYKERFLOW (MISCELLANEOUS) ×3
IV LACTATED RINGERS 1000ML (IV SOLUTION) ×3 IMPLANT
KIT RM TURNOVER CYSTO AR (KITS) ×3 IMPLANT
LABEL OR SOLS (LABEL) ×3 IMPLANT
LIQUID BAND (GAUZE/BANDAGES/DRESSINGS) ×3 IMPLANT
NDL SAFETY 22GX1.5 (NEEDLE) ×3 IMPLANT
NS IRRIG 500ML POUR BTL (IV SOLUTION) ×3 IMPLANT
PACK LAP CHOLECYSTECTOMY (MISCELLANEOUS) ×3 IMPLANT
PAD OB MATERNITY 4.3X12.25 (PERSONAL CARE ITEMS) ×3 IMPLANT
PAD PREP 24X41 OB/GYN DISP (PERSONAL CARE ITEMS) ×3 IMPLANT
SCISSORS METZENBAUM CVD 33 (INSTRUMENTS) IMPLANT
SHEARS HARMONIC ACE PLUS 36CM (ENDOMECHANICALS) IMPLANT
SLEEVE ENDOPATH XCEL 5M (ENDOMECHANICALS) ×3 IMPLANT
SOL PREP PVP 2OZ (MISCELLANEOUS) ×3
SOLUTION PREP PVP 2OZ (MISCELLANEOUS) ×1 IMPLANT
SURGILUBE 2OZ TUBE FLIPTOP (MISCELLANEOUS) ×3 IMPLANT
SUT MNCRL 4-0 (SUTURE)
SUT MNCRL 4-0 27XMFL (SUTURE)
SUT VIC AB 0 CT1 36 (SUTURE) ×3 IMPLANT
SUT VIC AB 2-0 UR6 27 (SUTURE) IMPLANT
SUTURE MNCRL 4-0 27XMF (SUTURE) IMPLANT
TRAY FOLEY CATH SILVER 16FR LF (SET/KITS/TRAYS/PACK) ×3 IMPLANT
TROCAR ENDO BLADELESS 11MM (ENDOMECHANICALS) ×3 IMPLANT
TROCAR XCEL NON-BLD 5MMX100MML (ENDOMECHANICALS) ×3 IMPLANT
TROCAR XCEL UNIV SLVE 11M 100M (ENDOMECHANICALS) IMPLANT
TUBING INSUFFLATOR HI FLOW (MISCELLANEOUS) ×3 IMPLANT

## 2016-03-07 NOTE — Anesthesia Procedure Notes (Signed)
Procedure Name: Intubation Date/Time: 03/07/2016 4:33 PM Performed by: Lily KocherPERALTA, Vicki Grammatico Pre-anesthesia Checklist: Patient identified, Patient being monitored, Timeout performed, Emergency Drugs available and Suction available Patient Re-evaluated:Patient Re-evaluated prior to inductionOxygen Delivery Method: Circle system utilized Preoxygenation: Pre-oxygenation with 100% oxygen Intubation Type: IV induction Ventilation: Mask ventilation without difficulty Laryngoscope Size: Mac and 3 Grade View: Grade I Tube type: Oral Tube size: 7.0 mm Number of attempts: 1 Airway Equipment and Method: Stylet Placement Confirmation: ETT inserted through vocal cords under direct vision,  positive ETCO2 and breath sounds checked- equal and bilateral Secured at: 21 cm Tube secured with: Tape Dental Injury: Teeth and Oropharynx as per pre-operative assessment

## 2016-03-07 NOTE — Transfer of Care (Signed)
Immediate Anesthesia Transfer of Care Note  Patient: Vicki Dominguez  Procedure(s) Performed: Procedure(s): LAPAROSCOPIC OVARIAN CYSTECTOMY (Left)  Patient Location: PACU  Anesthesia Type:General  Level of Consciousness: awake, alert  and patient cooperative  Airway & Oxygen Therapy: Patient Spontanous Breathing and Patient connected to face mask oxygen  Post-op Assessment: Report given to RN and Post -op Vital signs reviewed and stable  Post vital signs: Reviewed and stable  Last Vitals:  Filed Vitals:   03/07/16 1528 03/07/16 1827  BP: 123/93   Pulse: 92   Temp: 36.2 C 36.6 C  Resp: 18     Last Pain:  Filed Vitals:   03/07/16 1827  PainSc: 8       Patients Stated Pain Goal: 0 (03/07/16 0225)  Complications: No apparent anesthesia complications

## 2016-03-07 NOTE — Op Note (Signed)
Operative Report  Pre-Op Diagnosis:  1) Acute abdominal pain, refractory 2) left ovarian cyst   Post-Op Diagnosis:  1) Acute abdominal pain, refractory 2) left ovarian cyst   Procedures:  1. Operative laparoscopy 2. Left ovarian cystectomy  Primary Surgeon: Thomasene Mohair, MD   EBL: 50 ml   IVF: 900 mL   Urine output: 900 mL  Specimens: left ovarian cyst walls (two cysts)  Drains: None  Complications: None   Disposition: PACU   Condition: Stable   Findings:  1) two left ovarian cysts, the first (~4cm) appeared hemorrhagic, the second (~1.5 cm) appeared mucinous. 2) normal-appearing appendix 3) otherwise normal uterus and fallopian tubes  Procedure Summary:   The patient was taken to the operating room where general anesthesia was administered and found to be adequate. She was placed in the dorsal supine lithotomy position in [place] stirrups and prepped and draped in usual sterile fashion. After a timeout was called an indwelling catheter was placed in her bladder. A sterile sponge-on-a-stick was placed in the vagina  Attention was turned to the abdomen where after injection of local anesthetic, a 5 mm infraumbilical incision was made with the scalpel. Entry into the abdomen was obtained via Optiview trocar technique (a blunt entry technique with camera visualization through the obturator upon entry). Verification of entry into the abdomen was obtained using opening pressures. The abdomen was insufflated with CO2. The camera was introduced through the trocar with verification of atraumatic entry.  A right lower quadrant 5mm port was placed under direct, intra-abdominal camera visualization without difficulty. A suprapubic, initially 5mm (later switched to 11mm) trocar port was placed under direct, intra-abdominal camera visualization without difficulty.  A survey of the abdomen and pelvis was undertaken with the above-noted findings.    The left ovary was elevated and the  capsule of the ovary was scored with monopolar electrocautery. The cyst was shelled out of the ovary with minimal remaining cyst wall remaining. The cyst wall was pierced incidentally and no contents appeared to leak.  The contents appeared dark in color.  The specimen was placed in an endo-catch bag and removed.  The other cyst was adjacent to the first. The cortex of the ovary immediately adjacent to the first cyst was scored in order to gain access to the second cyst.  The second cyst was was incidentally pierced and a mucoid substance was removed.  The wall of the cyst was removed with minimal cyst wall remaining.  There was some mild oozing from the ovary, which was cauterized with monopolar electrocautery.  The ovary along the walls of the prior cyst beds was packed with Fibrillar and hemostasis was obtained.  The pelvis was copiously irrigated.    This terminated the procedure.  The 11mm suprapubic fascia was closed using 0-vicryl with a single stitch. The abdomen was desufflated of CO2 after five deep breaths were given by anesthesia.  The two remaining trocars were removed and the skin was closed with surgical skin glue.  The suprapubic port site skin was closed in a subcuticular fashion with 4-0 monocryl and reinforced with surgical skin glue.  Each trocar site was injected with 0.5% plain marcaine prior to initial skin incision.     The indwelling catheter was removed, as well as the sponge stick.  The vagina was inspected and found to be clear.    Sponge, lap, needle, and instrument counts were correct x 2.  VTE prophylaxis: lovenox  subcutaneous and SCDs. Antibiotic prophylaxis: None. The patient  tolerated the procedure well and was taken to the PACU in stable condition.   Thomasene MohairStephen Alvetta Hidrogo, MD 03/07/2016 10:09 PM

## 2016-03-07 NOTE — Brief Op Note (Signed)
02/28/2016 - 03/07/2016  6:15 PM  PATIENT:  Vicki Dominguez  29 y.o. female  PRE-OPERATIVE DIAGNOSIS:  LEFT OVARIAN CYST  POST-OPERATIVE DIAGNOSIS:  LEFT OVARIAN CYST  PROCEDURE:  Procedure(s): LAPAROSCOPIC OVARIAN CYSTECTOMY (Left)  SURGEON:  Surgeon(s) and Role:    * Conard NovakStephen D Theodosia Bahena, MD - Primary  ANESTHESIA:   general  EBL: 50 mL  IVF: 900 mL     Total I/O In: 1485 [I.V.:1485] Out: 250 [Urine:250]  BLOOD ADMINISTERED:none  DRAINS: none   LOCAL MEDICATIONS USED:  BUPIVICAINE   SPECIMEN:  Source of Specimen:  Left ovarian  cyst walls (two cysts removed)  DISPOSITION OF SPECIMEN:  PATHOLOGY  COUNTS:  YES  TOURNIQUET:  * No tourniquets in log *  DICTATION: .Note written in EPIC  PLAN OF CARE: return to inpatient floor  PATIENT DISPOSITION:  PACU - hemodynamically stable.   Delay start of Pharmacological VTE agent (>24hrs) due to surgical blood loss or risk of bleeding: no  Thomasene MohairStephen Woodfin Kiss, MD 03/07/2016 6:17 PM

## 2016-03-07 NOTE — Progress Notes (Signed)
Patient ID: Vicki Halonshley D Duplessis, female   DOB: 11-Oct-1985, 30 y.o.   MRN: [id number] [facility] PROGRESS NOTE  Vicki Dominguez WUJ:[id number] DOB: 11-Oct-1985 DOA: 02/28/2016 [facility]  HPI/Subjective: Patient still with severe burning upper abdomen, no BM, Lower abdominal discomfort   Objective: Filed Vitals:   03/06/16 2146 03/07/16 0452  BP: 119/84 106/70  Pulse: 114 78  Temp: 98.3 F (36.8 C) 98 F (36.7 C)  Resp: 18 22    Filed Weights   02/28/16 1404 02/29/16 0106 03/06/16 1559  Weight: 62.596 kg (138 lb) 67.405 kg (148 lb 9.6 oz) 67.132 kg (148 lb)    ROS: Review of Systems  Constitutional: Negative for fever and chills.  Eyes: Negative for blurred vision.  Respiratory: Negative for cough and shortness of breath.   Cardiovascular: Negative for chest pain.  Gastrointestinal: Positive for nausea and abdominal pain. Negative for vomiting, diarrhea and constipation.  Genitourinary: Negative for dysuria.  Musculoskeletal: Negative for joint pain.  Neurological: Negative for dizziness and headaches.   Exam: Physical Exam  Constitutional: She is oriented to person, place, and time.  HENT:  Nose: No mucosal edema.  Mouth/Throat: No oropharyngeal exudate or posterior oropharyngeal edema.  Eyes: Conjunctivae, EOM and lids are normal. Pupils are equal, round, and reactive to light.  Neck: No JVD present. Carotid bruit is not present. No edema present. No thyroid mass and no thyromegaly present.  Cardiovascular: S1 normal and S2 normal.  Exam reveals no gallop.   No murmur heard. Pulses:      Dorsalis pedis pulses are 2+ on the right side, and 2+ on the left side.  Respiratory: No respiratory distress. She has no wheezes. She has no rhonchi. She has no rales.  GI: Soft. Bowel sounds are normal. There is generalized tenderness.  Musculoskeletal:       Right ankle: She exhibits no swelling.       Left ankle: She exhibits no swelling.  Lymphadenopathy:     She has no cervical adenopathy.  Neurological: She is alert and oriented to person, place, and time. No cranial nerve deficit.  Skin: Skin is warm. No rash noted. Nails show no clubbing.  Psychiatric: She has a normal mood and affect.      Data Reviewed: Basic Metabolic Panel:  Recent Labs Lab 03/03/16 0446 03/06/16 0506  NA  --  137  K  --  3.4*  CL  --  104  CO2  --  28  GLUCOSE  --  98  BUN  --  8  CREATININE 0.62 0.65  CALCIUM  --  9.1  CBC:  Recent Labs Lab 03/06/16 0506  WBC 5.2  HGB 13.7  HCT 40.9  MCV 87.5  PLT 179     Recent Results (from the past 240 hour(s))  Urine culture     Status: None   Collection Time: 02/29/16  5:30 AM  Result Value Ref Range Status   Specimen Description URINE, RANDOM  Final   Special Requests NONE  Final   Culture NO GROWTH [facility]   Final   Report Status 03/01/2016 FINAL  Final      Scheduled Meds: . alprazolam  2 mg Oral BID  . amphetamine-dextroamphetamine  30 mg Oral Daily  . calcium carbonate  1 tablet Oral BID  . dicyclomine  10 mg Oral TID AC & HS  . docusate sodium  100 mg Oral BID  . enoxaparin (LOVENOX) injection  40 mg  Subcutaneous Q24H  . feeding supplement  1 Container Oral BID BM  . fluticasone  1 spray Each Nare Daily  . gi cocktail  30 mL Oral QHS  . nicotine  21 mg Transdermal Daily  . pantoprazole  40 mg Oral BID  . PARoxetine  25 mg Oral Daily  . polyethylene glycol  17 g Oral Daily  . sucralfate  1 g Oral TID WC & HS    Assessment/Plan:  1. Diffuse abdominal pain. Esophagitis and Gastritis. Patient on oral Protonix and Carafate. GI cocktail. This can also make not want to eat much 2. Acute colitis seen on CT scan. Patient not having diarrhea. Stopped abx. 3. Left lower quadrant pain. Patient has a Ovarian hemorrhagic cyst.  To go to OR today for cystectomy. 4. Anxiety depression on Paxil 5. Constipation- will get more aggressive with medications after surgery  today    Code Status:     Code Status Orders        Start     Ordered   02/29/16 0051  Full code   Continuous     02/29/16 0050    Code Status History    Date Active Date Inactive Code Status Order ID Comments User Context   This patient has a current code status but no historical code status.     Disposition Plan: potentially home tomorrow  Time spent: 20 minutes  Alford Highland  [employer]

## 2016-03-07 NOTE — Progress Notes (Signed)
Patient ID: Drema Halonshley D Rosner, female   DOB: 1985-12-28, 30 y.o.   MRN: [id number] [facility] Progress Note Drema Halonshley D Abramo  [id number]  HD#9  Subjective:  Overnight Events: None.  Complaints: continued abdominal pain, especially in LLQ She denies: fevers, chills, chest pain, trouble breathing.  She has been NPO since midnight She reports her pain is well controlled with PO oxycodone.   She is ambulating and is voiding.  Objective:  Most recent vitals Temp: 98 F (36.7 C)  BP: 106/70 mmHg  Pulse Rate: 78  Resp: (!) 22  SpO2: 99 %   Vitals Range over 24 hours BP  Min: 92/65  Max: 121/82 Temp  Avg: 98 F (36.7 C)  Min: 96.9 F (36.1 C)  Max: 98.6 F (37 C) Pulse  Avg: 95.5  Min: 78  Max: 114 SpO2  Avg: 99.4 %  Min: 97 %  Max: 100 %   Physical Exam General: alert, well appearing, and in no distress Heart: regular rate and rhythm, no murmurs Lungs: clear to auscultation, no wheezes, rales or rhonchi, symmetric air entry Abdomen: soft, mild tenderness to palpation, mild distension, +BS, no rebound or guarding, no obvious masses. Extremities: no redness or tenderness in the calves or thighs, no edema  AM Labs Lab Results  Component Value Date   WBC 5.2 03/06/2016   HGB 13.7 03/06/2016   HCT 40.9 03/06/2016   PLT 179 03/06/2016   NA 137 03/06/2016   K 3.4* 03/06/2016   CREATININE 0.65 03/06/2016   BUN 8 03/06/2016     Assessment:  Drema Halonshley D Warnick is a 30 y.o. female HD#9 admitted with colitis, diffuse abdominal pain, but specific LLQ pain and a left ovarian cyst that is complex and is ~5cm.  There was no evidence of torsion on ultrasound. However, that can be unreliable as a predictor.  The option of cystectomy has been discussed given the refractory nature of her pain.  She is amenable to this.  I personally discussed the risks of surgery, including; risk of infection, bleeding, injury to bowel, bladder, nerves, blood vessels.  Risk of poor wound healing, risk of  recurrence of cyst.  Risk of no alleviation or only partial alleviation of pain.      Plan:  To OR today as planned for left ovarian cystectomy.  Will try to avoid oophorectomy.  However, she understands no guarantees can be made.  Risks/benefits/alternatives discussed.  Will proceed as planned.     Conard NovakJackson, Maurita Havener D, MD  03/07/2016 9:34 AM

## 2016-03-07 NOTE — Anesthesia Preprocedure Evaluation (Signed)
Anesthesia Evaluation  Patient identified by MRN, date of birth, ID band Patient awake    Reviewed: Allergy & Precautions, H&P , NPO status , Patient's Chart, lab work & pertinent test results, reviewed documented beta blocker date and time   History of Anesthesia Complications (+) Family history of anesthesia reaction and history of anesthetic complications  Airway Mallampati: II  TM Distance: >3 FB Neck ROM: full   Comment: TMJ Hx Dental no notable dental hx. (+) Teeth Intact, Missing   Pulmonary neg shortness of breath, neg sleep apnea, neg COPD, neg recent URI, Current Smoker,    Pulmonary exam normal breath sounds clear to auscultation       Cardiovascular Exercise Tolerance: Good negative cardio ROS Normal cardiovascular exam Rhythm:regular Rate:Normal     Neuro/Psych  Headaches, PSYCHIATRIC DISORDERS (Depression, anxiety, ADHD) Anxiety negative neurological ROS     GI/Hepatic Neg liver ROS, GERD  Medicated and Controlled,  Endo/Other  negative endocrine ROS  Renal/GU negative Renal ROS  Female GU complaint Ovarian cyst negative genitourinary   Musculoskeletal  (+) Arthritis , Osteoarthritis,    Abdominal   Peds  Hematology negative hematology ROS (+)   Anesthesia Other Findings Past Medical History:   Arthritis                                                    TMJ arthritis                                                Anxiety                                                      GERD (gastroesophageal reflux disease)                       Migraine Family Hx of what sounds like a pseudocholinesterase deficiency secondarily to anectine administration after two surgeries.                                                   Ovarian cyst  Reproductive/Obstetrics negative OB ROS                             Anesthesia Physical  Anesthesia Plan  ASA: II  Anesthesia Plan: General    Post-op Pain Management:    Induction:   Airway Management Planned: Oral ETT  Additional Equipment:   Intra-op Plan:   Post-operative Plan: Extubation in OR  Informed Consent: I have reviewed the patients History and Physical, chart, labs and discussed the procedure including the risks, benefits and alternatives for the proposed anesthesia with the patient or authorized representative who has indicated his/her understanding and acceptance.   Dental Advisory Given  Plan Discussed with: Anesthesiologist, CRNA and Surgeon  Anesthesia Plan Comments:  Anesthesia Quick Evaluation  

## 2016-03-08 ENCOUNTER — Encounter: Payer: Self-pay | Admitting: Obstetrics and Gynecology

## 2016-03-08 LAB — BASIC METABOLIC PANEL
Anion gap: 7 (ref 5–15)
BUN: 9 mg/dL (ref 6–20)
CHLORIDE: 106 mmol/L (ref 101–111)
CO2: 24 mmol/L (ref 22–32)
CREATININE: 0.53 mg/dL (ref 0.44–1.00)
Calcium: 8.9 mg/dL (ref 8.9–10.3)
Glucose, Bld: 142 mg/dL — ABNORMAL HIGH (ref 65–99)
POTASSIUM: 4.7 mmol/L (ref 3.5–5.1)
SODIUM: 137 mmol/L (ref 135–145)

## 2016-03-08 LAB — CULTURE, GROUP A STREP (THRC)

## 2016-03-08 LAB — CBC
HCT: 38.5 % (ref 35.0–47.0)
HEMOGLOBIN: 12.8 g/dL (ref 12.0–16.0)
MCH: 28.5 pg (ref 26.0–34.0)
MCHC: 33.2 g/dL (ref 32.0–36.0)
MCV: 85.8 fL (ref 80.0–100.0)
Platelets: 175 10*3/uL (ref 150–440)
RBC: 4.5 MIL/uL (ref 3.80–5.20)
RDW: 12.7 % (ref 11.5–14.5)
WBC: 7.6 10*3/uL (ref 3.6–11.0)

## 2016-03-08 MED ORDER — ONDANSETRON 8 MG PO TBDP: 8.0000 mg | ORAL_TABLET | Freq: Three times a day (TID) | ORAL | Status: DC | PRN

## 2016-03-08 MED ORDER — LACTULOSE 10 GM/15ML PO SOLN
30.0000 g | Freq: Once | ORAL | Status: AC
  Administered 2016-03-08: 09:00:00 30 g via ORAL
  Filled 2016-03-08: qty 60

## 2016-03-08 MED ORDER — POLYVINYL ALCOHOL 1.4 % OP SOLN
1.0000 [drp] | OPHTHALMIC | Status: DC
  Administered 2016-03-08 (×2): 1 [drp] via OPHTHALMIC
  Filled 2016-03-08: qty 15

## 2016-03-08 MED ORDER — POLYETHYLENE GLYCOL 3350 17 G PO PACK: 17.0000 g | PACK | Freq: Every day | ORAL | Status: DC

## 2016-03-08 MED ORDER — OXYCODONE HCL 5 MG PO TABS: ORAL_TABLET | ORAL | Status: DC

## 2016-03-08 MED ORDER — PANTOPRAZOLE SODIUM 40 MG PO TBEC: 40.0000 mg | DELAYED_RELEASE_TABLET | Freq: Two times a day (BID) | ORAL | Status: DC

## 2016-03-08 MED ORDER — POLYVINYL ALCOHOL 1.4 % OP SOLN: 1.0000 [drp] | OPHTHALMIC | Status: DC | PRN

## 2016-03-08 MED ORDER — SUCRALFATE 1 GM/10ML PO SUSP: 1.0000 g | Freq: Three times a day (TID) | ORAL | Status: DC

## 2016-03-08 MED ORDER — POLYETHYLENE GLYCOL 3350 17 G PO PACK: 17.0000 g | PACK | Freq: Every day | ORAL | Status: DC | PRN

## 2016-03-08 NOTE — Progress Notes (Signed)
Pt continues to c/o abd pain and states that she does not want to go home. Mds notified and instructed pt that there is no further management could be done from hospital setting and the same medications that would be given here for pain could be given at home. Pt encouraged to walk and eliminate gas from abdomen and instructed pt to take laxatives and stool softeners to aid in bowel movements. Pt verbalized an understanding and agreed to d/c. Mds aware. Will continue to monitor.

## 2016-03-08 NOTE — Anesthesia Postprocedure Evaluation (Signed)
Anesthesia Post Note  Patient: Vicki Dominguez  Procedure(s) Performed: Procedure(s) (LRB): LAPAROSCOPIC OVARIAN CYSTECTOMY (Left)  Patient location during evaluation: PACU Anesthesia Type: General Level of consciousness: awake and alert and oriented Pain management: pain level controlled Vital Signs Assessment: post-procedure vital signs reviewed and stable Respiratory status: spontaneous breathing Cardiovascular status: blood pressure returned to baseline Anesthetic complications: no    Last Vitals:  Filed Vitals:   03/07/16 2333 03/08/16 0348  BP: 106/75 98/69  Pulse: 101 91  Temp: 36.6 C 36.6 C  Resp: 16 16    Last Pain:  Filed Vitals:   03/08/16 0348  PainSc: 9                  Royanne Warshaw

## 2016-03-08 NOTE — Discharge Summary (Signed)
[employer] - [place] [facility]   PATIENT NAME: Vicki Dominguez    MR#:  [id number]  DATE OF BIRTH:  October 09, 1985  DATE OF ADMISSION:  02/28/2016 ADMITTING PHYSICIAN: Oralia Manis, MD  DATE OF DISCHARGE: 03/08/2016  PRIMARY CARE PHYSICIAN: [facility]    ADMISSION DIAGNOSIS:  Colitis [K52.9] LLQ pain [R10.32]  DISCHARGE DIAGNOSIS:  Principal Problem:   Acute colitis Active Problems:   Anxiety   GERD (gastroesophageal reflux disease)   Migraine   Left ovarian cyst   SECONDARY DIAGNOSIS:   Past Medical History  Diagnosis Date  . Arthritis   . TMJ arthritis   . Anxiety   . GERD (gastroesophageal reflux disease)   . Migraine     HOSPITAL COURSE:   1. Severe abdominal pain. Patient was given antibiotics initially for colitis seen on CT scan. The patient continued to have poor appetite and not feeling well with burning in the abdomen. She was empirically on Protonix and Carafate. An EGD was done that showed esophagitis and gastritis. 2. Acute colitis seen on CT scan. Patient not having diarrhea and actually having constipation. Patient finished antibiotic course during the hospital course. 3. Left lower quadrant abdominal pain. Patient had an ovarian hemorrhagic cyst. The patient went to the OR yesterday for a laparoscopic cystectomy by gynecologist. Follow-up as outpatient. Small prescription for pain medication prescribed. 4. Anxiety depression on Paxil 5. Constipation I gave 1 dose of lactulose today and daily MiraLAX upon going home.  DISCHARGE CONDITIONS:   Satisfactory  CONSULTS OBTAINED:  Treatment Team:  Leola Brazil, MD  DRUG ALLERGIES:   Allergies  Allergen Reactions  . 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 Qty: 60 tablet, Refills: 0    polyethylene glycol (MIRALAX / GLYCOLAX) packet Take 17 g by mouth daily. Qty: 30 each, Refills: 0    polyvinyl alcohol (LIQUIFILM TEARS) 1.4 % ophthalmic solution Place 1 drop into the right eye as needed for dry eyes. Qty: 15 mL, Refills: 0    sucralfate (CARAFATE) 1 GM/10ML suspension Take 10 mLs (1 g total) by mouth 4 (four) times daily -  with meals and at bedtime. Qty: 420 mL, Refills: 0      CONTINUE these medications which have CHANGED   Details  ondansetron (ZOFRAN ODT) 8 MG disintegrating tablet Take 1 tablet (8 mg total) by mouth every 8 (eight) hours as needed for nausea or vomiting. Qty: 20 tablet, Refills: 0      CONTINUE these medications which have NOT CHANGED   Details  ADDERALL XR 30 MG 24 hr capsule Take 30 mg by mouth every morning. Refills: 0    alprazolam (XANAX) 2 MG tablet Take 2 mg by mouth 2 (two) times daily. Refills: 4    PARoxetine (PAXIL-CR) 25 MG 24 hr tablet Take 25 mg by mouth daily. Refills: 4      STOP taking these medications     aspirin-acetaminophen-caffeine (EXCEDRIN MIGRAINE) 250-250-65 MG tablet      bismuth subsalicylate (PEPTO-BISMOL) 262 MG/15ML suspension      ibuprofen (ADVIL,MOTRIN) 200  MG tablet      ranitidine (ZANTAC) 150 MG capsule          DISCHARGE INSTRUCTIONS:   Follow-up with medical doctor one week Follow-up with Dr. Mechele CollinElliott 3 weeks Follow-up with Dr. Odis LusterSteven [facility]ne[facility] for operative follow-up.  If you experience worsening of your admission symptoms, develop shortness of breath, life threatening emergency, suicidal or homicidal thoughts you must seek medical attention immediately by calling 911 or calling your MD immediately  if symptoms less severe.  You Must read complete  instructions/literature along with all the possible adverse reactions/side effects for all the Medicines you take and that have been prescribed to you. Take any new Medicines after you have completely understood and accept all the possible adverse reactions/side effects.   Please note  You were cared for by a hospitalist during your hospital stay. If you have any questions about your discharge medications or the care you received while you were in the hospital after you are discharged, you can call the unit and asked to speak with the hospitalist on call if the hospitalist that took care of you is not available. Once you are discharged, your primary care physician will handle any further medical issues. Please note that NO REFILLS for any discharge medications will be authorized once you are discharged, as it is imperative that you return to your primary care physician (or establish a relationship with a primary care physician if you do not have one) for your aftercare needs so that they can reassess your need for medications and monitor your lab values.    Today   CHIEF COMPLAINT:   Chief Complaint  Patient presents with  . Abdominal Pain    HISTORY OF PRESENT ILLNESS:  Vicki Dominguez  is a 30 y.o. female presented with abdominal pain   VITAL SIGNS:  Blood pressure 101/69, pulse 95, temperature 98.2 F (36.8 C), temperature source Oral, resp. rate 18, height 5\' 2"  (1.575 m), weight 67.132 kg (148 lb), last menstrual period 02/03/2016, SpO2 100 %.    PHYSICAL EXAMINATION:  GENERAL:  30 y.o.-year-old patient lying in the bed with no acute distress.  EYES: Pupils equal, round, reactive to light and accommodation. No scleral icterus. Extraocular muscles intact.  HEENT: Head atraumatic, normocephalic. Oropharynx and nasopharynx clear.  NECK:  Supple, no jugular venous distention. No thyroid enlargement, no tenderness.  LUNGS: Normal breath sounds bilaterally, no wheezing, rales,rhonchi or  crepitation. No use of accessory muscles of respiration.  CARDIOVASCULAR: S1, S2 normal. No murmurs, rubs, or gallops.  ABDOMEN: Soft, Slight tenderness throughout abdomen, slight distention. Bowel sounds present. No organomegaly or mass.  EXTREMITIES: No pedal edema, cyanosis, or clubbing.  NEUROLOGIC: Cranial nerves II through XII are intact. Muscle strength 5/5 in all extremities. Sensation intact. Gait not checked.  PSYCHIATRIC: The patient is alert and oriented x 3.  SKIN: No obvious rash, lesion, or ulcer.   DATA REVIEW:   CBC  Recent Labs Lab 03/08/16 0451  WBC 7.6  HGB 12.8  HCT 38.5  PLT 175    Chemistries   Recent Labs Lab 03/08/16 0451  NA 137  K 4.7  CL 106  CO2 24  GLUCOSE 142*  BUN 9  CREATININE 0.53  CALCIUM 8.9     Microbiology Results  Results for orders placed or performed during the hospital encounter of 02/28/16  Urine culture     Status: None   Collection Time: 02/29/16  5:30 AM  Result Value Ref Range Status  Specimen Description URINE, RANDOM  Final   Special Requests NONE  Final   Culture NO GROWTH Performed at [facility]   Final   Report Status 03/01/2016 FINAL  Final  Surgical PCR screen     Status: None   Collection Time: 03/07/16 11:14 AM  Result Value Ref Range Status   MRSA, PCR NEGATIVE NEGATIVE Final   Staphylococcus aureus NEGATIVE NEGATIVE Final    Comment:        The Xpert SA Assay (FDA approved for NASAL specimens in patients over 61 years of age), is one component of a comprehensive surveillance program.  Test performance has been validated by [facility] for patients greater than or equal to 66 year old. It is not intended to diagnose infection nor to guide or monitor treatment.     Management plans discussed with the patient, And she is in agreement.  CODE STATUS:     Code Status Orders        Start     Ordered   02/29/16 0051  Full code   Continuous     02/29/16 0050    Code Status  History    Date Active Date Inactive Code Status Order ID Comments User Context   This patient has a current code status but no historical code status.      TOTAL TIME TAKING CARE OF THIS PATIENT: 32 minutes.    Alford Highland M.D on 03/08/2016 at 4:48 PM  Between 7am to 6pm - Pager - [phone]  After 6pm go to [email or web address] - Social research officer, government  [employer] Office  [phone]  CC: Primary care physician; [facility]

## 2016-03-08 NOTE — Consult Note (Signed)
GI Inpatient Consult Note  Reason for Consult: Abdominal pain, nausea, and vomiting   Attending Requesting Consult: Dr. Renae Gloss  History of Present Illness: Vicki Dominguez is a 30 y.o. female with a history of arthritis, migraines, GERD, and anxiety admitted to [facility] on 02/28/2016 for abdominal pain.  Patient reported a 4 day history of abdominal pain, nausea, vomiting, and diarrhea.  She was evaluated at an urgent care and treated conservatively for viral gastroenteritis.  When symptoms did not improve, she sought ED evaluation.  Labs including CBC, CMP, lipase, UA, and UPT returned returned WNL.  CT a/p w/ contrast demonstrated colitis involving the descending colon, as well as a complicated 5cm L ovarian cyst w/ adjacent fluid and peripheral enhancement (endometrioma vs hemorrhagic cyst).  TVUS, pelvic US with doppler was negative for torsion.  She was admitted for further management, including Cipro and Flagyl, IV antiemetics, and IV pain control.  EGD demonstrated mild gastritis; H pylori path pending.  Today, patient reports continued epigastric burning.  She denies noticeable improvement with PPI BID and Carafate.  Tolerating clear liquids fairly well.  No nausea/vomiting.  No BMs, but has passed flatus.  No new concerns today.  Past Medical History:  Past Medical History  Diagnosis Date  . Arthritis   . TMJ arthritis   . Anxiety   . GERD (gastroesophageal reflux disease)   . Migraine     Problem List: Patient Active Problem List   Diagnosis Date Noted  . Left ovarian cyst 03/07/2016  . Acute colitis 02/28/2016  . Anxiety 02/28/2016  . GERD (gastroesophageal reflux disease) 02/28/2016  . Migraine 02/28/2016    Past Surgical History: Past Surgical History  Procedure Laterality Date  . Temporomandibular joint surgery    . Laparoscopic ovarian cystectomy Left 03/07/2016    Procedure: LAPAROSCOPIC OVARIAN CYSTECTOMY;  Surgeon: Conard Novak, MD;  Location: [facility] ORS;  Service:  Gynecology;  Laterality: Left;  . Esophagogastroduodenoscopy N/A 03/06/2016    Procedure: ESOPHAGOGASTRODUODENOSCOPY (EGD);  Surgeon: Scot Jun, MD;  Location: [facility] ENDOSCOPY;  Service: Endoscopy;  Laterality: N/A;    Allergies: Allergies  Allergen Reactions  . 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 ADDERALL XR 30 MG 24 hr capsule Take 30 mg by mouth every morning.  0 02/28/2016 at Unknown time  . alprazolam (XANAX) 2 MG tablet Take 2 mg by mouth 2 (two) times daily.  4 02/28/2016 at Unknown time  . aspirin-acetaminophen-caffeine (EXCEDRIN MIGRAINE) 250-250-65 MG tablet Take 1-2 tablets by mouth every 6 (six) hours as needed for headache.   prn at prn  . bismuth subsalicylate (PEPTO-BISMOL) 262 MG/15ML suspension 2 tablespoon or one double strength tablet 3 times a day with Zantac for the next 3-5 days 473 mL 0 02/28/2016 at Unknown time  . ibuprofen (ADVIL,MOTRIN) 200 MG tablet Take 200 mg by mouth every 6 (six) hours as needed.   02/28/2016 at Unknown time  . PARoxetine (PAXIL-CR) 25 MG 24 hr tablet Take 25 mg by mouth daily.  4 02/28/2016 at Unknown time  . ranitidine (ZANTAC) 150 MG capsule 1 capsule 3 times a day for the next 3-5 days, take the Pepto-Bismol 15 capsule 0 02/28/2016 at Unknown time  . [DISCONTINUED] ondansetron (ZOFRAN ODT) 8 MG disintegrating tablet Take 1 tablet (8 mg total) by mouth every 8 (eight) hours as needed for nausea or vomiting. 20 tablet  0 02/28/2016 at Unknown time   Home medication reconciliation was completed with the patient.   Scheduled Inpatient Medications:   . alprazolam  2 mg Oral BID  . amphetamine-dextroamphetamine  30 mg Oral Daily  . calcium carbonate  1 tablet Oral BID  . dicyclomine  10 mg Oral TID AC & HS  . docusate sodium  100 mg Oral BID  . enoxaparin (LOVENOX) injection  40 mg Subcutaneous Q24H   . feeding supplement  1 Container Oral BID BM  . fluticasone  1 spray Each Nare Daily  . gi cocktail  30 mL Oral QHS  . nicotine  21 mg Transdermal Daily  . pantoprazole  40 mg Oral BID  . PARoxetine  25 mg Oral Daily  . polyethylene glycol  17 g Oral Daily  . polyvinyl alcohol  1 drop Right Eye Q4H  . sucralfate  1 g Oral TID WC & HS    Continuous Inpatient Infusions:   . sodium chloride 100 mL/hr at 03/08/16 0426    PRN Inpatient Medications:  acetaminophen **OR** acetaminophen, alum & mag hydroxide-simeth, diphenhydrAMINE, guaiFENesin-dextromethorphan, menthol-cetylpyridinium, ondansetron **OR** ondansetron (ZOFRAN) IV, oxyCODONE-acetaminophen, polyethylene glycol, polyvinyl alcohol  Family History: family history includes Heart attack in her mother; Hypertension in her father.   Social History:   reports that she has been smoking Cigarettes.  She has been smoking about 0.50 packs per day. She does not have any smokeless tobacco history on file. She reports that she drinks alcohol. She reports that she does not use illicit drugs.   Review of Systems: Constitutional: Weight is stable.  Eyes: No changes in vision. ENT: No oral lesions, sore throat.  GI: see HPI.  Heme/Lymph: No easy bruising.  CV: No chest pain.  GU: No hematuria.  Integumentary: No rashes.  Neuro: No headaches.  Psych: No depression/anxiety.  Endocrine: No heat/cold intolerance.  Allergic/Immunologic: No urticaria.  Resp: No cough, SOB.  Musculoskeletal: No joint swelling.    Physical Examination: BP 98/69 mmHg  Pulse 91  Temp(Src) 97.8 F (36.6 C) (Oral)  Resp 16  Ht 5\' 2"  (1.575 m)  Wt 67.132 kg (148 lb)  BMI 27.06 kg/m2  SpO2 97%  LMP 02/03/2016 Gen: NAD, alert and oriented x 4 HEENT: PEERLA, EOMI, Neck: supple, no JVD or thyromegaly Chest: CTA bilaterally, no wheezes, crackles, or other adventitious sounds CV: RRR, no m/g/c/r Abd: soft, mild epigastric tenderness to deep palpation, ND,  +BS in all four quadrants; no HSM, guarding, ridigity, or rebound tenderness Ext: no edema, well perfused with 2+ pulses, Skin: no rash or lesions noted Lymph: no LAD  Data: Lab Results  Component Value Date   WBC 7.6 03/08/2016   HGB 12.8 03/08/2016   HCT 38.5 03/08/2016   MCV 85.8 03/08/2016   PLT 175 03/08/2016    Recent Labs Lab 03/06/16 0506 03/08/16 0451  HGB 13.7 12.8   Lab Results  Component Value Date   NA 137 03/08/2016   K 4.7 03/08/2016   CL 106 03/08/2016   CO2 24 03/08/2016   BUN 9 03/08/2016   CREATININE 0.53 03/08/2016   Lab Results  Component Value Date   ALT 11* 02/28/2016   AST 20 02/28/2016   ALKPHOS 67 02/28/2016   BILITOT 0.4 02/28/2016   No results for input(s): APTT, INR, PTT in the last 168 hours.   Assessment/Plan: Vicki Dominguez is a 30 y.o. female with a history of arthritis, migraines, GERD, and anxiety admitted to [facility] on 02/28/2016 for abdominal  pain.  Labs normal. CT a/p demonstrated colitis involving the descending colon as well as a 5 cm L ovarian cyst.   Diarrhea resolved quickly upon receiving Cipro and Flagyl.  Epigastric pain was unresponsive to multiple medications.  EGD demonstrated minimal gastritis.   H pylori path pending.  Recommend continuing PPI therapy BID and Carafate TID x 1 month, then continue PPI daily and d/c Carafate.  Also recommend maintaining bland diet upon d/c and slowly advancing as tolerated.  Recommend patient do not receive further narcotic pain medications.  Patient cleared for discharge from GI standpoint.  GI signing off.   Recommendations: - Continue PPI therapy BID and Carafate TID x 1 month, then continue PPI daily and d/c Carafate - Bland diet at discharge, slowly advance as tolerated - Avoid narcotic pain medications - Avoid NSAIDs - Clear for d/c from GI standpoint  Thank you for the consult. Please call with questions or concerns.  Burman Freestone, PA-C [facility] Phone:  [phone] Pager: [phone]

## 2016-03-08 NOTE — Progress Notes (Signed)
Patient ID: Vicki Dominguez, female   DOB: 1986/09/07, 30 y.o.   MRN: [id number] Daily Benign Gynecology Progress Note Vicki Dominguez  [id number]  HD#10/POD#1  Subjective:  Overnight Events: no acute events Complaints: upper abd pain She denies: fevers, chills, chest pain, trouble breathing, nausea, vomiting, severe abdominal pain. She has some abdominal pain in her lower abdomen she describes as sore. She has tolerated: clear liquids She reports her pain is well controlled with percocet.   She is ambulating and is voiding.  She has not passed flatus.   Objective:  Most recent vitals Temp: 97.8 F (36.6 C)  BP: 98/69 mmHg  Pulse Rate: 91  Resp: 16  SpO2: 97 %   Vitals Range over 24 hours BP  Min: 98/69  Max: 127/79 Temp  Avg: 98 F (36.7 C)  Min: 97.2 F (36.2 C)  Max: 98.4 F (36.9 C) Pulse  Avg: 94.1  Min: 84  Max: 106 SpO2  Avg: 99.7 %  Min: 97 %  Max: 100 %   Physical Exam General: alert, well appearing, and in no distress Heart: regular rate and rhythm, no murmurs Lungs: clear to auscultation, no wheezes, rales or rhonchi, symmetric air entry Abdomen: abdomen is soft without significant tenderness, masses, organomegaly or guarding. Incision: three laparoscopic incision clean, dry, and intact with surgical glue in place Extremities: no redness or tenderness in the calves or thighs, no edema  AM Labs Lab Results  Component Value Date   WBC 7.6 03/08/2016   HGB 12.8 03/08/2016   HCT 38.5 03/08/2016   PLT 175 03/08/2016   NA 137 03/08/2016   K 4.7 03/08/2016   CREATININE 0.53 03/08/2016   BUN 9 03/08/2016     Assessment:  Vicki Dominguez is a 30 y.o. female POD#1 s/p left ovarian cystectomy, doing well postop.    Plan:  From a surgical standpoint, she is cleared to go home.  As long as there is no other medical contraindication, her diet may be advanced to regular, as  Tolerated. Follow up with me in one week for a post-op check.  Please contact us, if further  assistance is needed.  Otherwise, I will sign off while she is in the hospital.   Conard NovakJackson, Cline Draheim D, MD  03/08/2016 7:40 AM

## 2016-03-09 LAB — SURGICAL PATHOLOGY

## 2016-03-13 ENCOUNTER — Telehealth: Payer: Medicaid Other | Admitting: Family

## 2016-03-13 NOTE — Progress Notes (Signed)
Based on what you shared with me it looks like you have a serious condition that should be evaluated in a face to face office visit.  If you are having a true medical emergency please call 911.  If you need an urgent face to face visit, [facility] has four urgent care centers for your convenience.  . [facility]  [phone] Get Driving Directions Find a Provider at this Location  [address] [place], [place] [address] . 8 am to 8 pm Monday-Friday . 9 am to 7 pm Saturday-Sunday  . [facility]  [phone] Get Driving Directions Find a Provider at this Location  [address], [address] [place], [place] [address] . 8 am to 8 pm Monday-Friday . 9 am to 6 pm Saturday . 11 am to 6 pm Sunday   . [facility] [place]  [phone] Get Driving Directions  [address] [place], [place] [address] . 8 am to 8 pm Monday-Friday . 9 am to 4 pm Saturday-Sunday   . [facility] (a walk in primary care provider)  [phone]  Get Driving Directions Find a Provider at this Location  [address] [place], [place] [address] . 8 am to 8:30 pm Monday-Thursday . 8 am to 6 pm Friday . 8 am to 4 pm Saturday-Sunday   Your e-visit answers were reviewed by a board certified advanced clinical practitioner to complete your personal care plan.  Thank you for using e-Visits. 

## 2016-03-18 ENCOUNTER — Ambulatory Visit
Admission: EM | Admit: 2016-03-18 | Discharge: 2016-03-18 | Disposition: A | Discharge status: Home / Self Care | Payer: Medicaid Other

## 2016-03-18 ENCOUNTER — Encounter: Payer: Self-pay | Admitting: *Deleted

## 2016-03-18 DIAGNOSIS — K529 Noninfective gastroenteritis and colitis, unspecified: Secondary | ICD-10-CM | POA: Diagnosis not present

## 2016-03-18 NOTE — ED Provider Notes (Signed)
CSN: [id number]     Arrival date & time 03/18/16  1518 History   None    Chief Complaint  Patient presents with  . Abdominal Pain  . Diarrhea   (Consider location/radiation/quality/duration/timing/severity/associated sxs/prior Treatment) HPI Comments: Patient presents today for lower abdominal pain, nausea and diarrhea. She denies vomiting. On 06/05: She was admitted to hospital for colitis and ovarian cysts, she was admitted for 9 days, discharged on 06/14. In the hospital of her first day of admission, she was started on oral Cipro and flagyl. Three days in to her hospital stay, she started to have epigastric burning pain, both antibiotics were stopped for this reason. On 06/12: Upper endoscopy found ulcer and she was started on several antiacids. 06/13: She had surgery for ovarian cyst removal. 6/14: she was discharged from hospital. Patient reports that she was never re-started back on Cipro and Flagyl. Reports that her abdominal pain got better in the hospital but never resolved. She followed up with her PCP on 06/19 and PCP wanted her to start taking another antibiotic (pt cannot recall name of abx). Patient end up not taking the antibiotic because the bottle said that it could cause diarrhea and cannot take with other antiacids; patient was already having diarrhea and was on antiacids; she was afraid to take the abx prescribed by her PCP.       Past Medical History  Diagnosis Date  . Arthritis   . TMJ arthritis   . Anxiety   . GERD (gastroesophageal reflux disease)   . Migraine    Past Surgical History  Procedure Laterality Date  . Temporomandibular joint surgery    . Laparoscopic ovarian cystectomy Left 03/07/2016    Procedure: LAPAROSCOPIC OVARIAN CYSTECTOMY;  Surgeon: Conard NovakStephen D Jackson, MD;  Location: [facility] ORS;  Service: Gynecology;  Laterality: Left;  . Esophagogastroduodenoscopy N/A 03/06/2016    Procedure: ESOPHAGOGASTRODUODENOSCOPY (EGD);  Surgeon: Scot Junobert T Elliott, MD;   [facility];  Service: Endoscopy;  Laterality: N/A;   Family History  Problem Relation Age of Onset  . Heart attack Mother   . Hypertension Father    Social History  Substance Use Topics  . Smoking status: Current Every Day Smoker -- 0.50 packs/day    Types: Cigarettes  . Smokeless tobacco: None  . Alcohol Use: 0.0 oz/week    0 Standard drinks or equivalent per week     Comment: occasionally   OB History    No data available     Review of Systems  Constitutional:       Had fever on 06/18, a day prior to her pcp appt  Respiratory: Negative for cough and shortness of breath.   Cardiovascular: Negative for chest pain and palpitations.  Gastrointestinal: Positive for nausea, abdominal pain and diarrhea. Negative for vomiting.       Denies blood in stool   Genitourinary:       Reports worsening in abdominal pain during urination   Neurological: Negative for dizziness, weakness and light-headedness.    Allergies  Succinylcholine chloride  Home Medications   Prior to Admission medications   Medication Sig Start Date End Date Taking? Authorizing Provider  acetaminophen (TYLENOL) 500 MG tablet Take 500 mg by mouth every 6 (six) hours as needed.   Yes Historical Provider, MD  Calcium Carbonate Antacid (TUMS E-X PO) Take by mouth.   Yes Historical Provider, MD  celecoxib (CELEBREX) 100 MG capsule Take 100 mg by mouth 2 (two) times daily.   Yes Historical Provider, MD  ADDERALL XR 30 MG 24 hr capsule Take 30 mg by mouth every morning. 02/08/16   Historical Provider, MD  alprazolam Prudy Feeler(XANAX) 2 MG tablet Take 2 mg by mouth 2 (two) times daily. 02/09/16   Historical Provider, MD  ondansetron (ZOFRAN ODT) 8 MG disintegrating tablet Take 1 tablet (8 mg total) by mouth every 8 (eight) hours as needed for nausea or vomiting. 03/08/16   Alford Highlandichard Wieting, MD  oxyCODONE (OXY IR/ROXICODONE) 5 MG immediate release tablet 1 tablets every 6 hours as needed for moderate to severe pain  03/08/16   Alford Highlandichard Wieting, MD  pantoprazole (PROTONIX) 40 MG tablet Take 1 tablet (40 mg total) by mouth 2 (two) times daily. 03/08/16   Alford Highlandichard Wieting, MD  PARoxetine (PAXIL-CR) 25 MG 24 hr tablet Take 25 mg by mouth daily. 02/09/16   Historical Provider, MD  polyethylene glycol (MIRALAX / GLYCOLAX) packet Take 17 g by mouth daily. 03/08/16   Alford Highlandichard Wieting, MD  polyvinyl alcohol (LIQUIFILM TEARS) 1.4 % ophthalmic solution Place 1 drop into the right eye as needed for dry eyes. 03/08/16   Alford Highlandichard Wieting, MD  sucralfate (CARAFATE) 1 GM/10ML suspension Take 10 mLs (1 g total) by mouth 4 (four) times daily -  with meals and at bedtime. 03/08/16   Alford Highlandichard Wieting, MD   Meds Ordered and Administered this Visit  Medications - No data to display  BP 125/85 mmHg  Pulse 110  Temp(Src) 98.1 F (36.7 C) (Oral)  Ht 5\' 2"  (1.575 m)  Wt 138 lb (62.596 kg)  BMI 25.23 kg/m2  SpO2 98%  LMP 03/06/2016 No data found.   Physical Exam  Constitutional: She is oriented to person, place, and time. She appears well-developed and well-nourished.  Cardiovascular: Normal rate, regular rhythm and normal heart sounds.   Pulmonary/Chest: Effort normal and breath sounds normal. She has no wheezes.  Abdominal: Soft. Bowel sounds are normal. There is tenderness.  Tender to palpate on lower right and left abdomen. Patient reports pain as she laid down on the exam table. Patient needed assistance to get up from the exam table due to abdominal pain    Neurological: She is alert and oriented to person, place, and time. She has normal reflexes.  Skin: Skin is warm and dry.  Nursing note and vitals reviewed.   ED Course  Procedures (including critical care time)  Labs Review Labs Reviewed - No data to display  Imaging Review No results found.   Visual Acuity Review  Right Eye Distance:   Left Eye Distance:   Bilateral Distance:    Right Eye Near:   Left Eye Near:    Bilateral Near:         MDM    1. Colitis    Patient's colitis was only partially treated. Given her signs and symptoms, I believe she that still has the colitis, and a repeat CT abdomen is indicated along with lab works. Patient recommended to go to the Emergency Department for further testing and evaluation. Patient will be transported to the ER in her own vehicle.     Lucia EstelleFeng Jacek Colson, NP 03/19/16 1104

## 2016-03-18 NOTE — ED Notes (Signed)
Pt states that she was admitted at [facility] with abdominal pain dx with colitis and ovarian cyst, later found ulcer too.  While admitted in the hospital she had surgery removing ovarian cyst, was discharged on 03/08/16.  After discharge she was still having abdominal pain, has gotten worse, now having diarrhea.

## 2016-04-27 ENCOUNTER — Telehealth: Payer: Self-pay | Admitting: Gastroenterology

## 2016-04-27 NOTE — Telephone Encounter (Signed)
Attempted to call patient to schedule appointment with Dr. Servando Snare for colitis but her mailbox is full and I can't leave a message.

## 2016-05-16 ENCOUNTER — Telehealth: Payer: Self-pay

## 2016-05-16 NOTE — Telephone Encounter (Signed)
Patient walked into our office mistaking her appointment date for today. We gave her the correct appointment date and time. I wrote the patient a letter explaining the confusion. She needed one to take to her doctors office. She had canceled the appointment to come here.

## 2016-06-07 ENCOUNTER — Other Ambulatory Visit: Payer: Self-pay

## 2016-06-08 ENCOUNTER — Other Ambulatory Visit: Payer: Self-pay

## 2016-06-08 ENCOUNTER — Encounter: Payer: Self-pay | Admitting: Gastroenterology

## 2016-06-08 ENCOUNTER — Ambulatory Visit (INDEPENDENT_AMBULATORY_CARE_PROVIDER_SITE_OTHER): Payer: Medicaid Other | Admitting: Gastroenterology

## 2016-06-08 VITALS — BP 118/82 | HR 112 | Temp 98.3°F | Ht 62.0 in | Wt 143.0 lb

## 2016-06-08 DIAGNOSIS — R197 Diarrhea, unspecified: Secondary | ICD-10-CM | POA: Diagnosis not present

## 2016-06-08 DIAGNOSIS — K5289 Other specified noninfective gastroenteritis and colitis: Secondary | ICD-10-CM | POA: Diagnosis not present

## 2016-06-08 NOTE — Progress Notes (Signed)
Gastroenterology Consultation  Referring Provider:     Care, [place] Primary Primary Care Physician:  [facility] Primary Gastroenterologist:  Dr. Servando Snare     Reason for Consultation:     GERD and diarrhea        HPI:   Vicki Dominguez is a 30 y.o. y/o female referred for consultation & management of GERD and diarrheaa by Dr. Dan Humphreys PRIMARY CARE.  This patient comes today for follow-up after being in the hospital.  The patient was not seen by me in the hospital but was seen by Dr. Mechele Collin.  At that time the patient was found to have a CT scan that showed her to have colitis.  She underwent an upper endoscopy with some nonspecific finding of eosinophils in her stomach biopsies.  The patient reports that her abdominal pain is not as bad as it was but she continues to have abdominal pain with continued loose bowel movements.  The patient also reports that she has heartburn despite being on Protonix twice a day.  The patient is now coming to me today because she states that she did not like the way Dr. Mechele Collin spoke to her or his bedside manner.  Past Medical History:  Diagnosis Date  . Anxiety   . Arthritis   . GERD (gastroesophageal reflux disease)   . Migraine   . TMJ arthritis     Past Surgical History:  Procedure Laterality Date  . ESOPHAGOGASTRODUODENOSCOPY N/A 03/06/2016   Procedure: ESOPHAGOGASTRODUODENOSCOPY (EGD);  Surgeon: Scot Jun, MD;  Location: [facility] ENDOSCOPY;  Service: Endoscopy;  Laterality: N/A;  . LAPAROSCOPIC OVARIAN CYSTECTOMY Left 03/07/2016   Procedure: LAPAROSCOPIC OVARIAN CYSTECTOMY;  Surgeon: Conard Novak, MD;  Location: [facility] ORS;  Service: Gynecology;  Laterality: Left;  . TEMPOROMANDIBULAR JOINT SURGERY      Prior to Admission medications   Medication Sig Start Date End Date Taking? Authorizing Provider  acetaminophen (TYLENOL) 500 MG tablet Take 500 mg by mouth every 6 (six) hours as needed.   Yes Historical Provider, MD  alprazolam Prudy Feeler)  2 MG tablet Take 2 mg by mouth 2 (two) times daily. 02/09/16  Yes Historical Provider, MD  Calcium Carbonate Antacid (TUMS E-X PO) Take by mouth.   Yes Historical Provider, MD  celecoxib (CELEBREX) 100 MG capsule Take 100 mg by mouth 2 (two) times daily.   Yes Historical Provider, MD  ibuprofen (ADVIL,MOTRIN) 800 MG tablet Take 800 mg by mouth every 8 (eight) hours as needed.   Yes Historical Provider, MD  lisdexamfetamine (VYVANSE) 40 MG capsule TAKE 1 CAPSULE BY MOUTH EVERY MORNING 04/17/16  Yes Historical Provider, MD  pantoprazole (PROTONIX) 40 MG tablet Take 1 tablet (40 mg total) by mouth 2 (two) times daily. 03/08/16  Yes Richard Renae Gloss, MD  PARoxetine (PAXIL-CR) 25 MG 24 hr tablet Take 25 mg by mouth daily. 02/09/16  Yes Historical Provider, MD  promethazine (PHENERGAN) 25 MG tablet Take 25 mg by mouth every 6 (six) hours as needed. for nausea 04/09/16  Yes Historical Provider, MD  SPRINTEC 28 0.25-35 MG-MCG tablet TAKE ONE TABLET BY MOUTH DAILY FOR 28 DAYS 04/09/16  Yes Historical Provider, MD  SUMAtriptan (IMITREX) 50 MG tablet Take 50 mg by mouth. 05/10/16 05/11/17 Yes Historical Provider, MD  ADDERALL XR 30 MG 24 hr capsule Take 30 mg by mouth every morning. 02/08/16   Historical Provider, MD  buprenorphine (SUBUTEX) 8 MG SUBL SL tablet Place 8 mg under the tongue. 02/04/16   Historical Provider,  MD  ciprofloxacin (CIPRO) 500 MG tablet Take 500 mg by mouth. 06/05/16 06/15/16  Historical Provider, MD  fluticasone (FLONASE) 50 MCG/ACT nasal spray 1 spray by Each Nare route daily.    Historical Provider, MD  nystatin cream (MYCOSTATIN) APPLY 1 APPLICATION TOPICALLY TWO (2) TIMES A DAY. 04/09/16   Historical Provider, MD  ondansetron (ZOFRAN ODT) 8 MG disintegrating tablet Take 1 tablet (8 mg total) by mouth every 8 (eight) hours as needed for nausea or vomiting. Patient not taking: Reported on 06/08/2016 03/08/16   Alford Highland, MD  ondansetron (ZOFRAN) 4 MG tablet Take 4 mg by mouth.    Historical  Provider, MD  oxyCODONE (OXY IR/ROXICODONE) 5 MG immediate release tablet 1 tablets every 6 hours as needed for moderate to severe pain Patient not taking: Reported on 06/08/2016 03/08/16   Alford Highland, MD  polyethylene glycol (MIRALAX / Ethelene Hal) packet Take 17 g by mouth daily. Patient not taking: Reported on 06/08/2016 03/08/16   Alford Highland, MD  polyvinyl alcohol (LIQUIFILM TEARS) 1.4 % ophthalmic solution Place 1 drop into the right eye as needed for dry eyes. Patient not taking: Reported on 06/08/2016 03/08/16   Alford Highland, MD  sertraline (ZOLOFT) 100 MG tablet Take 100 mg by mouth. 11/26/14   Historical Provider, MD  sucralfate (CARAFATE) 1 GM/10ML suspension Take 10 mLs (1 g total) by mouth 4 (four) times daily -  with meals and at bedtime. Patient not taking: Reported on 06/08/2016 03/08/16   Alford Highland, MD    Family History  Problem Relation Age of Onset  . Heart attack Mother   . Hypertension Father      Social History  Substance Use Topics  . Smoking status: Current Every Day Smoker    Packs/day: 0.50    Types: Cigarettes  . Smokeless tobacco: Never Used  . Alcohol use 0.0 oz/week     Comment: occasionally    Allergies as of 06/08/2016 - Review Complete 06/08/2016  Allergen Reaction Noted  . Succinylcholine chloride Other (See Comments) 03/06/2016    Review of Systems:    All systems reviewed and negative except where noted in HPI.   Physical Exam:  BP 118/82   Pulse (!) 112   Temp 98.3 F (36.8 C) (Oral)   Ht 5\' 2"  (1.575 m)   Wt 143 lb (64.9 kg)   BMI 26.16 kg/m  No LMP recorded. Psych:  Alert and cooperative. Normal mood and affect. General:   Alert,  Well-developed, well-nourished, pleasant and cooperative in NAD Head:  Normocephalic and atraumatic. Eyes:  Sclera clear, no icterus.   Conjunctiva pink. Ears:  Normal auditory acuity. Nose:  No deformity, discharge, or lesions. Mouth:  No deformity or lesions,oropharynx pink & moist. Neck:   Supple; no masses or thyromegaly. Lungs:  Respirations even and unlabored.  Clear throughout to auscultation.   No wheezes, crackles, or rhonchi. No acute distress. Heart:  Regular rate and rhythm; no murmurs, clicks, rubs, or gallops. Abdomen:  Normal bowel sounds.  No bruits.  Soft, non-tender and non-distended without masses, hepatosplenomegaly or hernias noted.  No guarding or rebound tenderness.  Negative Carnett sign.   Rectal:  Deferred.  Msk:  Symmetrical without gross deformities.  Good, equal movement & strength bilaterally. Pulses:  Normal pulses noted. Extremities:  No clubbing or edema.  No cyanosis. Neurologic:  Alert and oriented x3;  grossly normal neurologically. Skin:  Intact without significant lesions or rashes.  No jaundice. Lymph Nodes:  No significant cervical adenopathy. Psych:  Alert and cooperative. Normal mood and affect.  Imaging Studies: No results found.  Assessment and Plan:   Vicki Dominguez is a 30 y.o. y/o female who was in the hospital with a CT scan showing colitis and she was having diarrhea at the time.  The patient now reports that she continues to have diarrhea and abdominal pain but not as bad as it was in the hospital.  The patient did have an upper endoscopy by Dr. Mechele CollinElliott but she no longer wants to be seen by him.  She reports she did not like the way he spoke to her. The patient will be set up for a colonoscopy due to the finding of colitis and her continued diarrhea.   The patient will also be switched to a trial of Dexilant. The patient has been explained the plan and agrees with it   Note: This dictation was prepared with Dragon dictation along with smaller phrase technology. Any transcriptional errors that result from this process are unintentional.

## 2016-06-26 ENCOUNTER — Encounter: Payer: Self-pay | Admitting: *Deleted

## 2016-06-30 NOTE — Discharge Instructions (Signed)

## 2016-07-03 ENCOUNTER — Ambulatory Visit: Payer: Medicaid Other | Admitting: Anesthesiology

## 2016-07-03 ENCOUNTER — Encounter: Payer: Self-pay | Admitting: Anesthesiology

## 2016-07-03 ENCOUNTER — Encounter
Admission: RE | Disposition: A | Discharge status: Home / Self Care | Payer: Self-pay | Source: Ambulatory Visit | Attending: Gastroenterology

## 2016-07-03 ENCOUNTER — Ambulatory Visit
Admission: RE | Admit: 2016-07-03 | Discharge: 2016-07-03 | Disposition: A | Discharge status: Home / Self Care | Payer: Medicaid Other | Source: Ambulatory Visit | Attending: Gastroenterology | Admitting: Gastroenterology

## 2016-07-03 DIAGNOSIS — F419 Anxiety disorder, unspecified: Secondary | ICD-10-CM | POA: Insufficient documentation

## 2016-07-03 DIAGNOSIS — M1990 Unspecified osteoarthritis, unspecified site: Secondary | ICD-10-CM | POA: Insufficient documentation

## 2016-07-03 DIAGNOSIS — G43909 Migraine, unspecified, not intractable, without status migrainosus: Secondary | ICD-10-CM | POA: Diagnosis not present

## 2016-07-03 DIAGNOSIS — M26629 Arthralgia of temporomandibular joint, unspecified side: Secondary | ICD-10-CM | POA: Diagnosis not present

## 2016-07-03 DIAGNOSIS — Z8249 Family history of ischemic heart disease and other diseases of the circulatory system: Secondary | ICD-10-CM | POA: Insufficient documentation

## 2016-07-03 DIAGNOSIS — F17210 Nicotine dependence, cigarettes, uncomplicated: Secondary | ICD-10-CM | POA: Diagnosis not present

## 2016-07-03 DIAGNOSIS — F329 Major depressive disorder, single episode, unspecified: Secondary | ICD-10-CM | POA: Insufficient documentation

## 2016-07-03 DIAGNOSIS — K219 Gastro-esophageal reflux disease without esophagitis: Secondary | ICD-10-CM | POA: Diagnosis not present

## 2016-07-03 DIAGNOSIS — R933 Abnormal findings on diagnostic imaging of other parts of digestive tract: Secondary | ICD-10-CM | POA: Diagnosis not present

## 2016-07-03 DIAGNOSIS — F909 Attention-deficit hyperactivity disorder, unspecified type: Secondary | ICD-10-CM | POA: Diagnosis not present

## 2016-07-03 HISTORY — DX: Family history of other specified conditions: Z84.89

## 2016-07-03 HISTORY — PX: COLONOSCOPY WITH PROPOFOL: SHX5780

## 2016-07-03 HISTORY — DX: Arthralgia of temporomandibular joint, unspecified side: M26.629

## 2016-07-03 SURGERY — COLONOSCOPY WITH PROPOFOL
Anesthesia: Monitor Anesthesia Care | Wound class: Contaminated

## 2016-07-03 MED ORDER — ONDANSETRON HCL 4 MG/2ML IJ SOLN: 4.0000 mg | Freq: Once | INTRAMUSCULAR | Status: DC

## 2016-07-03 MED ORDER — ONDANSETRON HCL 4 MG/2ML IJ SOLN
4.0000 mg | Freq: Once | INTRAMUSCULAR | Status: AC
  Administered 2016-07-03: 4 mg via INTRAVENOUS

## 2016-07-03 MED ORDER — DICYCLOMINE HCL 20 MG PO TABS: 20.0000 mg | ORAL_TABLET | Freq: Four times a day (QID) | ORAL | 0 refills | Status: DC

## 2016-07-03 MED ORDER — LIDOCAINE HCL (CARDIAC) 20 MG/ML IV SOLN
INTRAVENOUS | Status: DC | PRN
  Administered 2016-07-03: 40 mg via INTRAVENOUS

## 2016-07-03 MED ORDER — DEXLANSOPRAZOLE 60 MG PO CPDR: 60.0000 mg | DELAYED_RELEASE_CAPSULE | Freq: Every day | ORAL | 11 refills | Status: DC

## 2016-07-03 MED ORDER — LACTATED RINGERS IV SOLN
INTRAVENOUS | Status: DC
  Administered 2016-07-03: 09:00:00 via INTRAVENOUS

## 2016-07-03 MED ORDER — PROPOFOL 10 MG/ML IV BOLUS
INTRAVENOUS | Status: DC | PRN
  Administered 2016-07-03: 50 mg via INTRAVENOUS
  Administered 2016-07-03 (×2): 40 mg via INTRAVENOUS
  Administered 2016-07-03: 50 mg via INTRAVENOUS
  Administered 2016-07-03: 20 mg via INTRAVENOUS
  Administered 2016-07-03 (×3): 50 mg via INTRAVENOUS

## 2016-07-03 MED ORDER — STERILE WATER FOR IRRIGATION IR SOLN
Status: DC | PRN
  Administered 2016-07-03: 09:00:00

## 2016-07-03 SURGICAL SUPPLY — 23 items

## 2016-07-03 NOTE — Anesthesia Postprocedure Evaluation (Signed)
Anesthesia Post Note  Patient: Vicki Dominguez  Procedure(s) Performed: Procedure(s) (LRB): COLONOSCOPY WITH PROPOFOL (N/A)  Patient location during evaluation: PACU Anesthesia Type: MAC Level of consciousness: awake and alert Pain management: pain level controlled Vital Signs Assessment: post-procedure vital signs reviewed and stable Respiratory status: spontaneous breathing, nonlabored ventilation, respiratory function stable and patient connected to nasal cannula oxygen Cardiovascular status: stable and blood pressure returned to baseline Anesthetic complications: no    Chayne Baumgart

## 2016-07-03 NOTE — Anesthesia Preprocedure Evaluation (Signed)
Anesthesia Evaluation  Patient identified by MRN, date of birth, ID band Patient awake    Reviewed: Allergy & Precautions, H&P , NPO status , Patient's Chart, lab work & pertinent test results, reviewed documented beta blocker date and time   History of Anesthesia Complications (+) Family history of anesthesia reaction and history of anesthetic complications (Mother difficult to awaken after Succinycholine)  Airway Mallampati: II  TM Distance: >3 FB Neck ROM: full   Comment: TMJ Hx Dental  (+) Missing   Pulmonary neg pulmonary ROS, Current Smoker,    Pulmonary exam normal        Cardiovascular Exercise Tolerance: Good negative cardio ROS Normal cardiovascular exam     Neuro/Psych  Headaches, PSYCHIATRIC DISORDERS (Depression, anxiety, ADHD) Anxiety Depression    GI/Hepatic Neg liver ROS, GERD  Controlled,  Endo/Other  negative endocrine ROS  Renal/GU negative Renal ROS   Ovarian cyst surgery in 02/2016  negative genitourinary   Musculoskeletal  (+) Arthritis , Osteoarthritis,  TMJ R pain;   Abdominal   Peds  Hematology negative hematology ROS (+)   Anesthesia Other Findings Family Hx of what sounds like a pseudocholinesterase deficiency secondarily to anectine administration after two surgeries.                                                    TMJ pain study med.  Reproductive/Obstetrics negative OB ROS                             Anesthesia Physical Anesthesia Plan  ASA: II  Anesthesia Plan: MAC   Post-op Pain Management:    Induction:   Airway Management Planned:   Additional Equipment:   Intra-op Plan:   Post-operative Plan:   Informed Consent: I have reviewed the patients History and Physical, chart, labs and discussed the procedure including the risks, benefits and alternatives for the proposed anesthesia with the patient or authorized representative who has  indicated his/her understanding and acceptance.     Plan Discussed with: CRNA  Anesthesia Plan Comments:         Anesthesia Quick Evaluation

## 2016-07-03 NOTE — Transfer of Care (Signed)
Immediate Anesthesia Transfer of Care Note  Patient: Vicki Dominguez  Procedure(s) Performed: Procedure(s): COLONOSCOPY WITH PROPOFOL (N/A)  Patient Location: PACU  Anesthesia Type: MAC  Level of Consciousness: awake, alert  and patient cooperative  Airway and Oxygen Therapy: Patient Spontanous Breathing and Patient connected to supplemental oxygen  Post-op Assessment: Post-op Vital signs reviewed, Patient's Cardiovascular Status Stable, Respiratory Function Stable, Patent Airway and No signs of Nausea or vomiting  Post-op Vital Signs: Reviewed and stable  Complications: No apparent anesthesia complications

## 2016-07-03 NOTE — Op Note (Signed)
[facility] Gastroenterology Patient Name: Vicki Dominguez Procedure Date: 07/03/2016 9:17 AM MRN: [id number] Account #: [id number] Date of Birth: 1986/01/25 Admit Type: Outpatient Age: 30 Room: [facility] OR ROOM 01 Gender: Female Note Status: Finalized Procedure:            Colonoscopy Indications:          Abnormal CT of the GI tract Providers:            Midge Minium MD, MD Referring MD:         Letitia Caul, MD (Referring MD) Medicines:            Propofol per Anesthesia Complications:        No immediate complications. Procedure:            Pre-Anesthesia Assessment:                       - Prior to the procedure, a History and Physical was                        performed, and patient medications and allergies were                        reviewed. The patient's tolerance of previous                        anesthesia was also reviewed. The risks and benefits of                        the procedure and the sedation options and risks were                        discussed with the patient. All questions were                        answered, and informed consent was obtained. Prior                        Anticoagulants: The patient has taken no previous                        anticoagulant or antiplatelet agents. ASA Grade                        Assessment: II - A patient with mild systemic disease.                        After reviewing the risks and benefits, the patient was                        deemed in satisfactory condition to undergo the                        procedure.                       After obtaining informed consent, the colonoscope was                        passed under direct vision. Throughout the procedure,  the patient's blood pressure, pulse, and oxygen                        saturations were monitored continuously. The Olympus                        CF-HQ190L Colonoscope (S#. [id number]) was introduced       through the anus and advanced to the the terminal                        ileum. The colonoscopy was performed without                        difficulty. The patient tolerated the procedure well.                        The quality of the bowel preparation was excellent. Findings:      The perianal and digital rectal examinations were normal.      The terminal ileum appeared normal.      The colon (entire examined portion) appeared normal. Impression:           - The examined portion of the ileum was normal.                       - The entire examined colon is normal.                       - No specimens collected. Recommendation:       - Discharge patient to home.                       - Resume previous diet.                       - Continue present medications. Procedure Code(s):    --- Professional ---                       [id number], Colonoscopy, flexible; diagnostic, including                        collection of specimen(s) by brushing or washing, when                        performed (separate procedure) Diagnosis Code(s):    --- Professional ---                       R93.3, Abnormal findings on diagnostic imaging of other                        parts of digestive tract CPT copyright 2016 American Medical Association. All rights reserved. The codes documented in this report are preliminary and upon coder review may  be revised to meet current compliance requirements. Midge Minium MD, MD 07/03/2016 9:40:31 AM This report has been signed electronically. Number of Addenda: 0 Note Initiated On: 07/03/2016 9:17 AM Scope Withdrawal Time: 0 hours 5 minutes 54 seconds  Total Procedure Duration: 0 hours 11 minutes 15 seconds       [facility]

## 2016-07-03 NOTE — H&P (Signed)
[name], MD [facility] [address] [place], [place] [address] Phone: [phone] Fax : [phone]  [facility]  [facility] Primary Gastroenterologist:  Dr. Servando SnareWohl  Pre-Procedure History & Physical: HPI:  Vicki Dominguez is a 30 y.o. female is here for an colonoscopy.   Past Medical History:  Diagnosis Date  . Anxiety   . Arthritis    hands, legs  . Family history of adverse reaction to anesthesia    Mother difficult to awaken after Succinycholine  . GERD (gastroesophageal reflux disease)   . Migraine    Rare.  Tension HA 3x/wk  . TMJ arthritis   . TMJ syndrome     Past Surgical History:  Procedure Laterality Date  . ESOPHAGOGASTRODUODENOSCOPY N/A 03/06/2016   Procedure: ESOPHAGOGASTRODUODENOSCOPY (EGD);  Surgeon: Scot Junobert T Elliott, MD;  [facility];  Service: Endoscopy;  Laterality: N/A;  . LAPAROSCOPIC OVARIAN CYSTECTOMY Left 03/07/2016   Procedure: LAPAROSCOPIC OVARIAN CYSTECTOMY;  Surgeon: Conard NovakStephen D Jackson, MD;  Location: [facility] ORS;  Service: Gynecology;  Laterality: Left;  . TEMPOROMANDIBULAR JOINT SURGERY      Prior to Admission medications   Medication Sig Start Date End Date Taking? Authorizing Provider  acetaminophen (TYLENOL) 500 MG tablet Take 500 mg by mouth every 6 (six) hours as needed.   Yes Historical Provider, MD  alprazolam Prudy Feeler(XANAX) 2 MG tablet Take 2 mg by mouth 2 (two) times daily. 02/09/16  Yes Historical Provider, MD  celecoxib (CELEBREX) 100 MG capsule Take 100 mg by mouth 2 (two) times daily.   Yes Historical Provider, MD  ibuprofen (ADVIL,MOTRIN) 800 MG tablet Take 800 mg by mouth every 8 (eight) hours as needed.   Yes Historical Provider, MD  Investigational - Study Medication 2 (two) times daily. Additional Study Details:  Propranolol versus placebo for TMJ   Yes Historical Provider, MD  lisdexamfetamine (VYVANSE) 40 MG capsule TAKE 1 CAPSULE BY MOUTH EVERY MORNING 04/17/16  Yes Historical Provider, MD    MAGNESIUM PO Take by mouth daily.   Yes Historical Provider, MD  Multiple Vitamin (MULTIVITAMIN) tablet Take 1 tablet by mouth daily.   Yes Historical Provider, MD  pantoprazole (PROTONIX) 40 MG tablet Take 1 tablet (40 mg total) by mouth 2 (two) times daily. 03/08/16  Yes Richard Renae GlossWieting, MD  PARoxetine (PAXIL-CR) 25 MG 24 hr tablet Take 25 mg by mouth daily. 02/09/16  Yes Historical Provider, MD  promethazine (PHENERGAN) 25 MG tablet Take 25 mg by mouth every 6 (six) hours as needed. for nausea 04/09/16  Yes Historical Provider, MD  SUMAtriptan (IMITREX) 50 MG tablet Take 50 mg by mouth. 05/10/16 05/11/17 Yes Historical Provider, MD    Allergies as of 06/08/2016 - Review Complete 06/08/2016  Allergen Reaction Noted  . Succinylcholine chloride Other (See Comments) 03/06/2016    Family History  Problem Relation Age of Onset  . Heart attack Mother   . Hypertension Father     Social History   Social History  . Marital status: Single    Spouse name: N/A  . Number of children: N/A  . Years of education: N/A   Occupational History  . Not on file.   Social History Main Topics  . Smoking status: Current Every Day Smoker    Packs/day: 0.50    Years: 10.00    Types: Cigarettes  . Smokeless tobacco: Never Used  . Alcohol use 0.0 oz/week     Comment: occasionally  . Drug use: No  . Sexual activity: Not on file  Other Topics Concern  . Not on file   Social History Narrative  . No narrative on file    Review of Systems: See HPI, otherwise negative ROS  Physical Exam: Ht 5\' 2"  (1.575 m)   Wt 143 lb (64.9 kg)   LMP 06/14/2016 (Exact Date)   BMI 26.16 kg/m  General:   Alert,  pleasant and cooperative in NAD Head:  Normocephalic and atraumatic. Neck:  Supple; no masses or thyromegaly. Lungs:  Clear throughout to auscultation.    Heart:  Regular rate and rhythm. Abdomen:  Soft, nontender and nondistended. Normal bowel sounds, without guarding, and without rebound.    Neurologic:  Alert and  oriented x4;  grossly normal neurologically.  Impression/Plan: Vicki Dominguez is here for an colonoscopy to be performed for colitis  Risks, benefits, limitations, and alternatives regarding  colonoscopy have been reviewed with the patient.  Questions have been answered.  All parties agreeable.   Midge Minium, MD  07/03/2016, 8:28 AM

## 2016-07-04 ENCOUNTER — Encounter: Payer: Self-pay | Admitting: Gastroenterology

## 2016-07-11 ENCOUNTER — Other Ambulatory Visit: Payer: Self-pay

## 2016-07-11 ENCOUNTER — Telehealth: Payer: Self-pay

## 2016-07-11 NOTE — Telephone Encounter (Signed)
Left vm regarding pt's symptoms. Will contact her in the morning.

## 2016-07-11 NOTE — Telephone Encounter (Signed)
Really bad acid reflux. Today has been really bad. She took Protonix, and Zantac, and Toms. She is still not feeling better. She wants to know if there is anything else that she can take or do.    Carafate was the only thing that helped her back then when she was feeling this way.   Please give her a call back.

## 2016-07-12 ENCOUNTER — Other Ambulatory Visit: Payer: Self-pay

## 2016-07-12 DIAGNOSIS — K2100 Gastro-esophageal reflux disease with esophagitis, without bleeding: Secondary | ICD-10-CM

## 2016-07-12 MED ORDER — SUCRALFATE 1 GM/10ML PO SUSP: 1.0000 g | Freq: Three times a day (TID) | ORAL | 3 refills | Status: DC

## 2016-07-12 NOTE — Telephone Encounter (Signed)
Left vm for pt to return my call. Pt has an allergy to ingredients in the carafate. Will need clarification from pt before sending.

## 2016-07-12 NOTE — Telephone Encounter (Signed)
Spoke with pt regarding the allergy listed on her list. Pt stated she has taken Carafate several times with no adverse reaction. Pt has requested I send medication to pharmacy. She will speak with pharmacist when she picks up medication.

## 2016-09-02 ENCOUNTER — Ambulatory Visit
Admission: EM | Admit: 2016-09-02 | Discharge: 2016-09-02 | Disposition: A | Discharge status: Home / Self Care | Payer: Medicaid Other | Attending: Family Medicine | Admitting: Family Medicine

## 2016-09-02 ENCOUNTER — Encounter: Payer: Self-pay | Admitting: Gynecology

## 2016-09-02 DIAGNOSIS — J029 Acute pharyngitis, unspecified: Secondary | ICD-10-CM | POA: Diagnosis not present

## 2016-09-02 DIAGNOSIS — J069 Acute upper respiratory infection, unspecified: Secondary | ICD-10-CM | POA: Diagnosis not present

## 2016-09-02 DIAGNOSIS — B9789 Other viral agents as the cause of diseases classified elsewhere: Secondary | ICD-10-CM | POA: Diagnosis not present

## 2016-09-02 DIAGNOSIS — F17210 Nicotine dependence, cigarettes, uncomplicated: Secondary | ICD-10-CM | POA: Insufficient documentation

## 2016-09-02 LAB — CBC WITH DIFFERENTIAL/PLATELET
Basophils Absolute: 0 10*3/uL (ref 0–0.1)
Basophils Relative: 1 %
EOS ABS: 0.3 10*3/uL (ref 0–0.7)
Eosinophils Relative: 5 %
HEMATOCRIT: 41.4 % (ref 35.0–47.0)
HEMOGLOBIN: 13.7 g/dL (ref 12.0–16.0)
LYMPHS ABS: 1.8 10*3/uL (ref 1.0–3.6)
Lymphocytes Relative: 31 %
MCH: 27.8 pg (ref 26.0–34.0)
MCHC: 33 g/dL (ref 32.0–36.0)
MCV: 84.2 fL (ref 80.0–100.0)
MONOS PCT: 5 %
Monocytes Absolute: 0.3 10*3/uL (ref 0.2–0.9)
NEUTROS PCT: 58 %
Neutro Abs: 3.3 10*3/uL (ref 1.4–6.5)
Platelets: 194 10*3/uL (ref 150–440)
RBC: 4.92 MIL/uL (ref 3.80–5.20)
RDW: 13 % (ref 11.5–14.5)
WBC: 5.7 10*3/uL (ref 3.6–11.0)

## 2016-09-02 LAB — RAPID STREP SCREEN (MED CTR MEBANE ONLY): Streptococcus, Group A Screen (Direct): NEGATIVE

## 2016-09-02 LAB — RAPID INFLUENZA A&B ANTIGENS (ARMC ONLY): INFLUENZA A (ARMC): NEGATIVE

## 2016-09-02 LAB — MONONUCLEOSIS SCREEN: Mono Screen: NEGATIVE

## 2016-09-02 LAB — RAPID INFLUENZA A&B ANTIGENS: Influenza B (ARMC): NEGATIVE

## 2016-09-02 MED ORDER — BENZONATATE 200 MG PO CAPS: 200.0000 mg | ORAL_CAPSULE | Freq: Three times a day (TID) | ORAL | 0 refills | Status: DC | PRN

## 2016-09-02 MED ORDER — CELECOXIB 200 MG PO CAPS: 200.0000 mg | ORAL_CAPSULE | Freq: Two times a day (BID) | ORAL | 0 refills | Status: DC | PRN

## 2016-09-02 MED ORDER — FEXOFENADINE-PSEUDOEPHED ER 180-240 MG PO TB24: 1.0000 | ORAL_TABLET | Freq: Every day | ORAL | 0 refills | Status: DC

## 2016-09-02 NOTE — ED Provider Notes (Signed)
[facility]    CSN: [id number] Arrival date & time: 09/02/16  1536     History   Chief Complaint Chief Complaint  Patient presents with  . Sore Throat    HPI Vicki Dominguez is a 30 y.o. female.   Patient is here because of sore throat she states started Thursday evening. She reports fever myalgia sore throat and States the back for throat she was very concerned because they state the back. Throat she came in was seen and evaluated. She is not running a fever now she states she has had her flu shot. She reports pain on the right side of her ear and her face as well. Past bony history she's had a history of colitis she's had upper endoscopy and colonoscopy as well in recent past since had a list a history of left ovarian cyst, anxiety, GERD, and migraines. She's had laparoscopic treatment for the left ovarian cyst and TMJ surgery in the past as well. She currently smokes her mother has had a heart attack in her father has hypertension      Past Medical History:  Diagnosis Date  . Anxiety   . Arthritis    hands, legs  . Family history of adverse reaction to anesthesia    Mother difficult to awaken after Succinycholine  . GERD (gastroesophageal reflux disease)   . Migraine    Rare.  Tension HA 3x/wk  . TMJ arthritis   . TMJ syndrome     Patient Active Problem List   Diagnosis Date Noted  . Abnormal findings-gastrointestinal tract   . ADD (attention deficit disorder) 06/07/2016  . Left ovarian cyst 03/07/2016  . Acute colitis 02/28/2016  . Anxiety 02/28/2016  . GERD (gastroesophageal reflux disease) 02/28/2016  . Migraine 02/28/2016    Past Surgical History:  Procedure Laterality Date  . COLONOSCOPY WITH PROPOFOL N/A 07/03/2016   Procedure: COLONOSCOPY WITH PROPOFOL;  Surgeon: Midge Miniumarren Wohl, MD;  [facility];  Service: Endoscopy;  Laterality: N/A;  . ESOPHAGOGASTRODUODENOSCOPY N/A 03/06/2016   Procedure: ESOPHAGOGASTRODUODENOSCOPY (EGD);   Surgeon: Scot Junobert T Elliott, MD;  [facility];  Service: Endoscopy;  Laterality: N/A;  . LAPAROSCOPIC OVARIAN CYSTECTOMY Left 03/07/2016   Procedure: LAPAROSCOPIC OVARIAN CYSTECTOMY;  Surgeon: Conard NovakStephen D Jackson, MD;  Location: [facility] ORS;  Service: Gynecology;  Laterality: Left;  . TEMPOROMANDIBULAR JOINT SURGERY      OB History    No data available       Home Medications    Prior to Admission medications   Medication Sig Start Date End Date Taking? Authorizing Provider  acetaminophen (TYLENOL) 500 MG tablet Take 500 mg by mouth every 6 (six) hours as needed.   Yes Historical Provider, MD  alprazolam Prudy Feeler(XANAX) 2 MG tablet Take 2 mg by mouth 2 (two) times daily. 02/09/16  Yes Historical Provider, MD  celecoxib (CELEBREX) 100 MG capsule Take 100 mg by mouth 2 (two) times daily.   Yes Historical Provider, MD  dexlansoprazole (DEXILANT) 60 MG capsule Take 1 capsule (60 mg total) by mouth daily. 07/03/16  Yes Midge Miniumarren Wohl, MD  dicyclomine (BENTYL) 20 MG tablet Take 1 tablet (20 mg total) by mouth every 6 (six) hours. 07/03/16  Yes Midge Miniumarren Wohl, MD  ibuprofen (ADVIL,MOTRIN) 800 MG tablet Take 800 mg by mouth every 8 (eight) hours as needed.   Yes Historical Provider, MD  lisdexamfetamine (VYVANSE) 40 MG capsule TAKE 1 CAPSULE BY MOUTH EVERY MORNING 04/17/16  Yes Historical Provider, MD  Multiple Vitamin (MULTIVITAMIN)  tablet Take 1 tablet by mouth daily.   Yes Historical Provider, MD  pantoprazole (PROTONIX) 40 MG tablet Take 1 tablet (40 mg total) by mouth 2 (two) times daily. 03/08/16  Yes Richard Renae Gloss, MD  PARoxetine (PAXIL-CR) 25 MG 24 hr tablet Take 25 mg by mouth daily. 02/09/16  Yes Historical Provider, MD  sucralfate (CARAFATE) 1 GM/10ML suspension Take 10 mLs (1 g total) by mouth 4 (four) times daily -  with meals and at bedtime. 07/12/16  Yes Midge Minium, MD  SUMAtriptan (IMITREX) 50 MG tablet Take 50 mg by mouth. 05/10/16 05/11/17 Yes Historical Provider, MD  benzonatate (TESSALON)  200 MG capsule Take 1 capsule (200 mg total) by mouth 3 (three) times daily as needed. 09/02/16   Hassan Rowan, MD  celecoxib (CELEBREX) 200 MG capsule Take 1 capsule (200 mg total) by mouth 2 (two) times daily as needed for moderate pain. Take with food 09/02/16   Hassan Rowan, MD  fexofenadine-pseudoephedrine (ALLEGRA-D ALLERGY & CONGESTION) 180-240 MG 24 hr tablet Take 1 tablet by mouth daily. 09/02/16   Hassan Rowan, MD  Investigational - Study Medication 2 (two) times daily. Additional Study Details:  Propranolol versus placebo for TMJ    Historical Provider, MD  MAGNESIUM PO Take by mouth daily.    Historical Provider, MD  promethazine (PHENERGAN) 25 MG tablet Take 25 mg by mouth every 6 (six) hours as needed. for nausea 04/09/16   Historical Provider, MD    Family History Family History  Problem Relation Age of Onset  . Heart attack Mother   . Hypertension Father     Social History Social History  Substance Use Topics  . Smoking status: Current Every Day Smoker    Packs/day: 0.50    Years: 10.00    Types: Cigarettes  . Smokeless tobacco: Never Used  . Alcohol use 0.0 oz/week     Comment: occasionally     Allergies   Succinylcholine chloride and Tape   Review of Systems Review of Systems  Constitutional: Positive for fatigue and fever.  HENT: Positive for mouth sores, sinus pressure and sore throat.   All other systems reviewed and are negative.    Physical Exam Triage Vital Signs ED Triage Vitals  Enc Vitals Group     BP 09/02/16 1550 (!) 130/91     Pulse Rate 09/02/16 1550 92     Resp 09/02/16 1550 16     Temp 09/02/16 1550 99 F (37.2 C)     Temp Source 09/02/16 1550 Oral     SpO2 09/02/16 1550 100 %     Weight 09/02/16 1552 150 lb (68 kg)     Height --      Head Circumference --      Peak Flow --      Pain Score 09/02/16 1555 4     Pain Loc --      Pain Edu? --      Excl. in GC? --    No data found.   Updated Vital Signs BP (!) 130/91 (BP Location:  Left Arm)   Pulse 92   Temp 99 F (37.2 C) (Oral)   Resp 16   Wt 150 lb (68 kg)   LMP 08/10/2016   SpO2 100%   BMI 27.44 kg/m   Visual Acuity Right Eye Distance:   Left Eye Distance:   Bilateral Distance:    Right Eye Near:   Left Eye Near:    Bilateral Near:     Physical  Exam  Constitutional: She is oriented to person, place, and time. She appears well-developed and well-nourished.  HENT:  Head: Normocephalic and atraumatic.  Right Ear: Hearing, tympanic membrane, external ear and ear canal normal.  Left Ear: Hearing, tympanic membrane, external ear and ear canal normal.  Nose: Mucosal edema present. Right sinus exhibits no maxillary sinus tenderness and no frontal sinus tenderness. Left sinus exhibits no maxillary sinus tenderness and no frontal sinus tenderness.  Mouth/Throat: Uvula is midline. Posterior oropharyngeal erythema present.  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 Not the type of exudate 1 mL with either strep or mono  Eyes: Conjunctivae, EOM and lids are normal. Pupils are equal, round, and reactive to light.  Neck: Normal range of motion. Neck supple.  Cardiovascular: Normal rate and regular rhythm.   Pulmonary/Chest: Effort normal.  Musculoskeletal: Normal range of motion.  Lymphadenopathy:    She has cervical adenopathy.  Neurological: She is alert and oriented to person, place, and time.  Skin: Skin is warm and dry.  Psychiatric: She has a normal mood and affect.  Vitals reviewed.    UC Treatments / Results  Labs (all labs ordered are listed, but only abnormal results are displayed) Labs Reviewed  RAPID STREP SCREEN (NOT AT [facility])  RAPID INFLUENZA A&B ANTIGENS ([facility] ONLY)  CULTURE, GROUP A STREP ([facility])  CBC WITH DIFFERENTIAL/PLATELET  MONONUCLEOSIS SCREEN    EKG  EKG Interpretation None       Radiology No results found.  Procedures Procedures (including critical  care time)  Medications Ordered in UC Medications - No data to display   Initial Impression / Assessment and Plan / UC Course  I have reviewed the triage vital signs and the nursing notes.  Pertinent labs & imaging results that were available during my care of the patient were reviewed by me and considered in my medical decision making (see chart for details).   Results for orders placed or performed during the hospital encounter of 09/02/16  Rapid strep screen  Result Value Ref Range   Streptococcus, Group A Screen (Direct) NEGATIVE NEGATIVE  Rapid Influenza A&B Antigens ([facility] only)  Result Value Ref Range   Influenza A ([facility]) NEGATIVE NEGATIVE   Influenza B ([facility]) NEGATIVE NEGATIVE  CBC with Differential  Result Value Ref Range   WBC 5.7 3.6 - 11.0 K/uL   RBC 4.92 3.80 - 5.20 MIL/uL   Hemoglobin 13.7 12.0 - 16.0 g/dL   HCT 40.941.4 81.135.0 - 91.447.0 %   MCV 84.2 80.0 - 100.0 fL   MCH 27.8 26.0 - 34.0 pg   MCHC 33.0 32.0 - 36.0 g/dL   RDW 78.213.0 95.611.5 - 21.314.5 %   Platelets 194 150 - 440 K/uL   Neutrophils Relative % 58 %   Neutro Abs 3.3 1.4 - 6.5 K/uL   Lymphocytes Relative 31 %   Lymphs Abs 1.8 1.0 - 3.6 K/uL   Monocytes Relative 5 %   Monocytes Absolute 0.3 0.2 - 0.9 K/uL   Eosinophils Relative 5 %   Eosinophils Absolute 0.3 0 - 0.7 K/uL   Basophils Relative 1 %   Basophils Absolute 0.0 0 - 0.1 K/uL  Mononucleosis screen  Result Value Ref Range   Mono Screen NEGATIVE NEGATIVE   Clinical Course     Patient informed strep test was negative. When explained to her that the lesions in her mouth or more viral and not black strep  or mono she is informing now she was exposed to someone recently who had mono. Also she is also concerned about the achiness in the fever. We'll obtain a flu test mono CBC explained to her both those negative I would not put on antibiotics or antiviral instead we'll place her on Allegra-D Tessalon Perles may be some Mobic aches and pain. We didn't have a  follow-up PCP in 3-4 days if not better.  Final Clinical Impressions(s) / UC Diagnoses   Final diagnoses:  Viral pharyngitis  Viral upper respiratory tract infection    New Prescriptions New Prescriptions   BENZONATATE (TESSALON) 200 MG CAPSULE    Take 1 capsule (200 mg total) by mouth 3 (three) times daily as needed.   CELECOXIB (CELEBREX) 200 MG CAPSULE    Take 1 capsule (200 mg total) by mouth 2 (two) times daily as needed for moderate pain. Take with food   FEXOFENADINE-PSEUDOEPHEDRINE (ALLEGRA-D ALLERGY & CONGESTION) 180-240 MG 24 HR TABLET    Take 1 tablet by mouth daily.     Note: This dictation was prepared with Dragon dictation along with smaller phrase technology. Any transcriptional errors that result from this process are unintentional.   Hassan Rowan, MD 09/02/16 1655

## 2016-09-02 NOTE — ED Triage Notes (Signed)
Patient c/o sore throat , right ear pain, coughing and low grade fever at home.

## 2016-09-05 LAB — CULTURE, GROUP A STREP (THRC)

## 2016-09-06 ENCOUNTER — Other Ambulatory Visit: Payer: Self-pay | Admitting: Gastroenterology

## 2016-10-06 ENCOUNTER — Encounter: Payer: Self-pay | Admitting: Emergency Medicine

## 2016-10-06 ENCOUNTER — Emergency Department
Admission: EM | Admit: 2016-10-06 | Discharge: 2016-10-07 | Disposition: A | Discharge status: Home / Self Care | Payer: Medicaid Other | Attending: Emergency Medicine | Admitting: Emergency Medicine

## 2016-10-06 DIAGNOSIS — R000 Tachycardia, unspecified: Secondary | ICD-10-CM

## 2016-10-06 DIAGNOSIS — Z79899 Other long term (current) drug therapy: Secondary | ICD-10-CM | POA: Insufficient documentation

## 2016-10-06 DIAGNOSIS — F17210 Nicotine dependence, cigarettes, uncomplicated: Secondary | ICD-10-CM | POA: Insufficient documentation

## 2016-10-06 DIAGNOSIS — R002 Palpitations: Secondary | ICD-10-CM | POA: Diagnosis present

## 2016-10-06 MED ORDER — SODIUM CHLORIDE 0.9 % IV BOLUS (SEPSIS)
1000.0000 mL | Freq: Once | INTRAVENOUS | Status: AC
Start: 2016-10-07 — End: 2016-10-07
  Administered 2016-10-06: 1000 mL via INTRAVENOUS

## 2016-10-06 NOTE — ED Triage Notes (Signed)
PT to stat desk, report taking diet pill yesterday, since then feel heart racing, generalized pain, mild chest pain.  Pt w/ trouble concentrating. Pt NAD at this time.

## 2016-10-07 ENCOUNTER — Emergency Department: Payer: Medicaid Other

## 2016-10-07 LAB — URINALYSIS, ROUTINE W REFLEX MICROSCOPIC: Specific Gravity, Urine: 1.033 — ABNORMAL HIGH (ref 1.005–1.030)

## 2016-10-07 LAB — URINE DRUG SCREEN, QUALITATIVE (ARMC ONLY)
Amphetamines, Ur Screen: POSITIVE — AB
BARBITURATES, UR SCREEN: NOT DETECTED
BENZODIAZEPINE, UR SCRN: POSITIVE — AB
Cannabinoid 50 Ng, Ur ~~LOC~~: POSITIVE — AB
Cocaine Metabolite,Ur ~~LOC~~: NOT DETECTED
MDMA (Ecstasy)Ur Screen: NOT DETECTED
METHADONE SCREEN, URINE: NOT DETECTED
OPIATE, UR SCREEN: NOT DETECTED
Phencyclidine (PCP) Ur S: NOT DETECTED
TRICYCLIC, UR SCREEN: POSITIVE — AB

## 2016-10-07 LAB — BASIC METABOLIC PANEL
ANION GAP: 10 (ref 5–15)
BUN: 18 mg/dL (ref 6–20)
CO2: 25 mmol/L (ref 22–32)
Calcium: 10.1 mg/dL (ref 8.9–10.3)
Chloride: 104 mmol/L (ref 101–111)
Creatinine, Ser: 0.69 mg/dL (ref 0.44–1.00)
GFR calc Af Amer: >60 mL/min (ref >60)
GLUCOSE: 105 mg/dL — AB (ref 65–99)
POTASSIUM: 2.9 mmol/L — AB (ref 3.5–5.1)
Sodium: 139 mmol/L (ref 135–145)

## 2016-10-07 LAB — CBC
HEMATOCRIT: 43 % (ref 35.0–47.0)
HEMOGLOBIN: 14.8 g/dL (ref 12.0–16.0)
MCH: 28.5 pg (ref 26.0–34.0)
MCHC: 34.4 g/dL (ref 32.0–36.0)
MCV: 82.7 fL (ref 80.0–100.0)
Platelets: 253 10*3/uL (ref 150–440)
RBC: 5.19 MIL/uL (ref 3.80–5.20)
RDW: 13.2 % (ref 11.5–14.5)
WBC: 8.6 10*3/uL (ref 3.6–11.0)

## 2016-10-07 LAB — TSH: TSH: 1.658 u[IU]/mL (ref 0.350–4.500)

## 2016-10-07 LAB — POCT PREGNANCY, URINE: PREG TEST UR: NEGATIVE

## 2016-10-07 LAB — TROPONIN I: Troponin I: <0.03 ng/mL (ref <0.03)

## 2016-10-07 MED ORDER — ACETAMINOPHEN 325 MG PO TABS
ORAL_TABLET | ORAL | Status: AC
  Administered 2016-10-07: 650 mg via ORAL
  Filled 2016-10-07: qty 2

## 2016-10-07 MED ORDER — ACETAMINOPHEN 325 MG PO TABS
650.0000 mg | ORAL_TABLET | Freq: Once | ORAL | Status: AC
  Administered 2016-10-07: 650 mg via ORAL

## 2016-10-07 MED ORDER — LORAZEPAM 2 MG/ML IJ SOLN
1.0000 mg | Freq: Once | INTRAMUSCULAR | Status: AC
  Administered 2016-10-07: 1 mg via INTRAVENOUS
  Filled 2016-10-07: qty 1

## 2016-10-07 MED ORDER — POTASSIUM CHLORIDE 20 MEQ PO PACK
40.0000 meq | PACK | Freq: Two times a day (BID) | ORAL | Status: DC
  Administered 2016-10-07: 40 meq via ORAL
  Filled 2016-10-07: qty 2

## 2016-10-07 NOTE — ED Notes (Signed)
Patient called husband and d/c to lobby to wait for him to pick her up.

## 2016-10-07 NOTE — ED Provider Notes (Signed)
[facility] Emergency Department Provider Note    First MD Initiated Contact with Patient 10/06/16 2350     (approximate)  I have reviewed the triage vital signs and the nursing notes.   HISTORY  Chief Complaint Tachycardia    HPI Vicki Dominguez is a 31 y.o. female with below listed chronic medical conditions presents to the emergency department with heart palpitations insomnia anxiety, tremors and agitation status post taking a dietary/energy supplement yesterday. Patient states that she believes the active ingredient to be ephedrine as well as caffeine   Past Medical History:  Diagnosis Date  . Anxiety   . Arthritis    hands, legs  . Family history of adverse reaction to anesthesia    Mother difficult to awaken after Succinycholine  . GERD (gastroesophageal reflux disease)   . Migraine    Rare.  Tension HA 3x/wk  . TMJ arthritis   . TMJ syndrome     Patient Active Problem List   Diagnosis Date Noted  . Abnormal findings-gastrointestinal tract   . ADD (attention deficit disorder) 06/07/2016  . Left ovarian cyst 03/07/2016  . Acute colitis 02/28/2016  . Anxiety 02/28/2016  . GERD (gastroesophageal reflux disease) 02/28/2016  . Migraine 02/28/2016    Past Surgical History:  Procedure Laterality Date  . COLONOSCOPY WITH PROPOFOL N/A 07/03/2016   Procedure: COLONOSCOPY WITH PROPOFOL;  Surgeon: Midge Minium, MD;  Location: [facility];  Service: Endoscopy;  Laterality: N/A;  . ESOPHAGOGASTRODUODENOSCOPY N/A 03/06/2016   Procedure: ESOPHAGOGASTRODUODENOSCOPY (EGD);  Surgeon: Scot Jun, MD;  Location: [facility] ENDOSCOPY;  Service: Endoscopy;  Laterality: N/A;  . LAPAROSCOPIC OVARIAN CYSTECTOMY Left 03/07/2016   Procedure: LAPAROSCOPIC OVARIAN CYSTECTOMY;  Surgeon: Conard Novak, MD;  Location: [facility] ORS;  Service: Gynecology;  Laterality: Left;  . TEMPOROMANDIBULAR JOINT SURGERY      Prior to Admission medications   Medication  Sig Start Date End Date Taking? Authorizing Provider  acetaminophen (TYLENOL) 500 MG tablet Take 500 mg by mouth every 6 (six) hours as needed.    Historical Provider, MD  alprazolam Prudy Feeler) 2 MG tablet Take 2 mg by mouth 2 (two) times daily. 02/09/16   Historical Provider, MD  benzonatate (TESSALON) 200 MG capsule Take 1 capsule (200 mg total) by mouth 3 (three) times daily as needed. 09/02/16   Hassan Rowan, MD  celecoxib (CELEBREX) 100 MG capsule Take 100 mg by mouth 2 (two) times daily.    Historical Provider, MD  celecoxib (CELEBREX) 200 MG capsule Take 1 capsule (200 mg total) by mouth 2 (two) times daily as needed for moderate pain. Take with food 09/02/16   Hassan Rowan, MD  dexlansoprazole (DEXILANT) 60 MG capsule Take 1 capsule (60 mg total) by mouth daily. 07/03/16   Midge Minium, MD  dicyclomine (BENTYL) 10 MG capsule TAKE 2 CAPSULES (20 MG TOTAL) BY MOUTH EVERY 6 (SIX) HOURS. 09/07/16   Midge Minium, MD  fexofenadine-pseudoephedrine (ALLEGRA-D ALLERGY & CONGESTION) 180-240 MG 24 hr tablet Take 1 tablet by mouth daily. 09/02/16   Hassan Rowan, MD  ibuprofen (ADVIL,MOTRIN) 800 MG tablet Take 800 mg by mouth every 8 (eight) hours as needed.    Historical Provider, MD  Investigational - Study Medication 2 (two) times daily. Additional Study Details:  Propranolol versus placebo for TMJ    Historical Provider, MD  lisdexamfetamine (VYVANSE) 40 MG capsule TAKE 1 CAPSULE BY MOUTH EVERY MORNING 04/17/16   Historical Provider, MD  MAGNESIUM PO Take by mouth daily.  Historical Provider, MD  Multiple Vitamin (MULTIVITAMIN) tablet Take 1 tablet by mouth daily.    Historical Provider, MD  pantoprazole (PROTONIX) 40 MG tablet Take 1 tablet (40 mg total) by mouth 2 (two) times daily. 03/08/16   Alford Highlandichard Wieting, MD  PARoxetine (PAXIL-CR) 25 MG 24 hr tablet Take 25 mg by mouth daily. 02/09/16   Historical Provider, MD  promethazine (PHENERGAN) 25 MG tablet Take 25 mg by mouth every 6 (six) hours as needed. for  nausea 04/09/16   Historical Provider, MD  sucralfate (CARAFATE) 1 GM/10ML suspension Take 10 mLs (1 g total) by mouth 4 (four) times daily -  with meals and at bedtime. 07/12/16   Midge Miniumarren Wohl, MD  SUMAtriptan (IMITREX) 50 MG tablet Take 50 mg by mouth. 05/10/16 05/11/17  Historical Provider, MD    Allergies Succinylcholine chloride and Tape  Family History  Problem Relation Age of Onset  . Heart attack Mother   . Hypertension Father     Social History Social History  Substance Use Topics  . Smoking status: Current Every Day Smoker    Packs/day: 0.50    Years: 10.00    Types: Cigarettes  . Smokeless tobacco: Never Used  . Alcohol use 0.0 oz/week     Comment: occasionally    Review of Systems Constitutional: No fever/chills Eyes: No visual changes. ENT: No sore throat. Cardiovascular: Denies chest pain.Positive for palpitations Respiratory: Denies shortness of breath. Gastrointestinal: No abdominal pain.  No nausea, no vomiting.  No diarrhea.  No constipation. Genitourinary: Negative for dysuria. Musculoskeletal: Negative for back pain. Skin: Negative for rash. Neurological: Negative for headaches, focal weakness or numbness. Positive for tremulousness Psychiatric:Positive for anxiety   10-point ROS otherwise negative.  ____________________________________________   PHYSICAL EXAM:  VITAL SIGNS: ED Triage Vitals  Enc Vitals Group     BP 10/06/16 2338 (!) 108/91     Pulse Rate 10/06/16 2338 (!) 133     Resp 10/06/16 2338 20     Temp 10/06/16 2338 98.2 F (36.8 C)     Temp Source 10/06/16 2338 Oral     SpO2 10/06/16 2338 100 %     Weight 10/06/16 2339 147 lb (66.7 kg)     Height 10/06/16 2339 5\' 2"  (1.575 m)     Head Circumference --      Peak Flow --      Pain Score 10/06/16 2339 3     Pain Loc --      Pain Edu? --      Excl. in GC? --     Constitutional: Alert and oriented. Well appearing and in no acute distress. Eyes: Conjunctivae are normal. PERRL.  EOMI. Head: Atraumatic. Ears:  Healthy appearing ear canals and TMs bilaterally Nose: No congestion/rhinnorhea. Mouth/Throat: Mucous membranes are moist.  Oropharynx non-erythematous. Neck: No stridor.   Cardiovascular: Normal rate, regular rhythm. Good peripheral circulation. Grossly normal heart sounds. Respiratory: Normal respiratory effort.  No retractions. Lungs CTAB. Gastrointestinal: Soft and nontender. No distention.  Musculoskeletal: No lower extremity tenderness nor edema. No gross deformities of extremities. Neurologic:  Normal speech and language. No gross focal neurologic deficits are appreciated.  Skin:  Skin is warm, dry and intact. No rash noted. Psychiatric: Very anxious affect. Speech and behavior are normal.  ____________________________________________   LABS (all labs ordered are listed, but only abnormal results are displayed)  Labs Reviewed  BASIC METABOLIC PANEL - Abnormal; Notable for the following:       Result Value   Potassium  2.9 (*)    Glucose, Bld 105 (*)    All other components within normal limits  URINALYSIS, ROUTINE W REFLEX MICROSCOPIC - Abnormal; Notable for the following:    Color, Urine ORANGE (*)    APPearance HAZY (*)    Specific Gravity, Urine 1.033 (*)    Glucose, UA   (*)    Value: TEST NOT REPORTED DUE TO COLOR INTERFERENCE OF URINE PIGMENT   Hgb urine dipstick   (*)    Value: TEST NOT REPORTED DUE TO COLOR INTERFERENCE OF URINE PIGMENT   Bilirubin Urine   (*)    Value: TEST NOT REPORTED DUE TO COLOR INTERFERENCE OF URINE PIGMENT   Ketones, ur   (*)    Value: TEST NOT REPORTED DUE TO COLOR INTERFERENCE OF URINE PIGMENT   Protein, ur   (*)    Value: TEST NOT REPORTED DUE TO COLOR INTERFERENCE OF URINE PIGMENT   Nitrite   (*)    Value: TEST NOT REPORTED DUE TO COLOR INTERFERENCE OF URINE PIGMENT   Leukocytes, UA   (*)    Value: TEST NOT REPORTED DUE TO COLOR INTERFERENCE OF URINE PIGMENT   Bacteria, UA RARE (*)    Squamous  Epithelial / LPF 6-30 (*)    All other components within normal limits  URINE DRUG SCREEN, QUALITATIVE ([facility] ONLY) - Abnormal; Notable for the following:    Tricyclic, Ur Screen POSITIVE (*)    Amphetamines, Ur Screen POSITIVE (*)    Cannabinoid 50 Ng, Ur [place] POSITIVE (*)    Benzodiazepine, Ur Scrn POSITIVE (*)    All other components within normal limits  CBC  TROPONIN I  TSH  POCT PREGNANCY, URINE   ____________________________________________  EKG  ED ECG REPORT I, [place] N BROWN, the attending physician, personally viewed and interpreted this ECG.   Date: 10/07/2016  EKG Time: 11:50 PM  Rate: 111  Rhythm: Sinus tachycardia  Axis: Normal  Intervals: Normal  ST&T Change: None  ____________________________________________  RADIOLOGY I, [place] N BROWN, personally viewed and evaluated these images (plain radiographs) as part of my medical decision making, as well as reviewing the written report by the radiologist.  Dg Chest Port 1 View  Result Date: 10/07/2016 CLINICAL DATA:  Tachycardia and chest pain after taking diet pill yesterday. EXAM: PORTABLE CHEST 1 VIEW COMPARISON:  05/03/2008 FINDINGS: The heart size and mediastinal contours are within normal limits. Both lungs are clear. The visualized skeletal structures are unremarkable. IMPRESSION: No active disease. Electronically Signed   By: Burman Nieves M.D.   On: 10/07/2016 00:27     Procedures     INITIAL IMPRESSION / ASSESSMENT AND PLAN / ED COURSE  Pertinent labs & imaging results that were available during my care of the patient were reviewed by me and considered in my medical decision making (see chart for details).   31 year old female presenting with tachycardia and anxiety, insomnia agitation following taking a energy supplement with ephedrine. In addition the patient admits to smoking marijuana yesterday patient also taking Vyvanse for ADHD. I suspect the patient's etiology for her symptoms at  present is indeed the ephedrine/polypharmacy. Patient given Ativan 1 mg IV and 1 L IV normal saline with complete resolution of tachycardia anxiety and all symptoms.   Clinical Course     ____________________________________________  FINAL CLINICAL IMPRESSION(S) / ED DIAGNOSES  Final diagnoses:  Tachycardia     MEDICATIONS GIVEN DURING THIS VISIT:  Medications  potassium chloride (KLOR-CON) packet 40 mEq (not administered)  sodium chloride 0.9 % bolus 1,000 mL (1,000 mLs Intravenous New Bag/Given 10/06/16 2355)  LORazepam (ATIVAN) injection 1 mg (1 mg Intravenous Given 10/07/16 0030)     NEW OUTPATIENT MEDICATIONS STARTED DURING THIS VISIT:  New Prescriptions   No medications on file    Modified Medications   No medications on file    Discontinued Medications   No medications on file     Note:  This document was prepared using Dragon voice recognition software and may include unintentional dictation errors.    Darci Current, MD 10/08/16 0530

## 2016-10-20 ENCOUNTER — Other Ambulatory Visit: Payer: Self-pay | Admitting: Gastroenterology

## 2016-12-07 ENCOUNTER — Emergency Department: Payer: Medicaid Other

## 2016-12-07 ENCOUNTER — Encounter: Payer: Self-pay | Admitting: Emergency Medicine

## 2016-12-07 ENCOUNTER — Emergency Department
Admission: EM | Admit: 2016-12-07 | Discharge: 2016-12-07 | Disposition: A | Discharge status: Home / Self Care | Payer: Medicaid Other | Attending: Emergency Medicine | Admitting: Emergency Medicine

## 2016-12-07 DIAGNOSIS — N83201 Unspecified ovarian cyst, right side: Secondary | ICD-10-CM | POA: Insufficient documentation

## 2016-12-07 DIAGNOSIS — F17210 Nicotine dependence, cigarettes, uncomplicated: Secondary | ICD-10-CM | POA: Insufficient documentation

## 2016-12-07 DIAGNOSIS — R1030 Lower abdominal pain, unspecified: Secondary | ICD-10-CM

## 2016-12-07 DIAGNOSIS — R1032 Left lower quadrant pain: Secondary | ICD-10-CM | POA: Diagnosis present

## 2016-12-07 DIAGNOSIS — Z79899 Other long term (current) drug therapy: Secondary | ICD-10-CM | POA: Insufficient documentation

## 2016-12-07 HISTORY — DX: Major depressive disorder, single episode, unspecified: F32.9

## 2016-12-07 HISTORY — DX: Depression, unspecified: F32.A

## 2016-12-07 HISTORY — DX: Unspecified ovarian cyst, unspecified side: N83.209

## 2016-12-07 HISTORY — DX: Noninfective gastroenteritis and colitis, unspecified: K52.9

## 2016-12-07 LAB — URINALYSIS, COMPLETE (UACMP) WITH MICROSCOPIC
BACTERIA UA: NONE SEEN
BILIRUBIN URINE: NEGATIVE
Glucose, UA: NEGATIVE mg/dL
Hgb urine dipstick: NEGATIVE
KETONES UR: NEGATIVE mg/dL
Nitrite: NEGATIVE
PROTEIN: 30 mg/dL — AB
RBC / HPF: NONE SEEN RBC/hpf (ref 0–5)
SPECIFIC GRAVITY, URINE: 1.017 (ref 1.005–1.030)
pH: 6 (ref 5.0–8.0)

## 2016-12-07 LAB — WET PREP, GENITAL
Clue Cells Wet Prep HPF POC: NONE SEEN
Sperm: NONE SEEN
TRICH WET PREP: NONE SEEN
Yeast Wet Prep HPF POC: NONE SEEN

## 2016-12-07 LAB — CHLAMYDIA/NGC RT PCR (ARMC ONLY)
CHLAMYDIA TR: NOT DETECTED
N gonorrhoeae: NOT DETECTED

## 2016-12-07 LAB — COMPREHENSIVE METABOLIC PANEL
ALBUMIN: 4.9 g/dL (ref 3.5–5.0)
ALT: 20 U/L (ref 14–54)
AST: 29 U/L (ref 15–41)
Alkaline Phosphatase: 92 U/L (ref 38–126)
Anion gap: 7 (ref 5–15)
BUN: 16 mg/dL (ref 6–20)
CHLORIDE: 102 mmol/L (ref 101–111)
CO2: 28 mmol/L (ref 22–32)
Calcium: 10.1 mg/dL (ref 8.9–10.3)
Creatinine, Ser: 0.37 mg/dL — ABNORMAL LOW (ref 0.44–1.00)
GFR calc Af Amer: >60 mL/min (ref >60)
Glucose, Bld: 140 mg/dL — ABNORMAL HIGH (ref 65–99)
POTASSIUM: 3.7 mmol/L (ref 3.5–5.1)
Sodium: 137 mmol/L (ref 135–145)
Total Bilirubin: 0.7 mg/dL (ref 0.3–1.2)
Total Protein: 8.7 g/dL — ABNORMAL HIGH (ref 6.5–8.1)

## 2016-12-07 LAB — CBC
HEMATOCRIT: 45.1 % (ref 35.0–47.0)
HEMOGLOBIN: 15 g/dL (ref 12.0–16.0)
MCH: 27.9 pg (ref 26.0–34.0)
MCHC: 33.3 g/dL (ref 32.0–36.0)
MCV: 83.6 fL (ref 80.0–100.0)
Platelets: 288 10*3/uL (ref 150–440)
RBC: 5.39 MIL/uL — ABNORMAL HIGH (ref 3.80–5.20)
RDW: 13.8 % (ref 11.5–14.5)
WBC: 8.8 10*3/uL (ref 3.6–11.0)

## 2016-12-07 LAB — LIPASE, BLOOD: LIPASE: 21 U/L (ref 11–51)

## 2016-12-07 LAB — POCT PREGNANCY, URINE: PREG TEST UR: NEGATIVE

## 2016-12-07 MED ORDER — MORPHINE SULFATE (PF) 4 MG/ML IV SOLN
4.0000 mg | Freq: Once | INTRAVENOUS | Status: AC
  Administered 2016-12-07: 4 mg via INTRAVENOUS

## 2016-12-07 MED ORDER — OXYCODONE-ACETAMINOPHEN 5-325 MG PO TABS
1.0000 | ORAL_TABLET | Freq: Once | ORAL | Status: AC
  Administered 2016-12-07: 1 via ORAL
  Filled 2016-12-07: qty 1

## 2016-12-07 MED ORDER — ONDANSETRON HCL 4 MG/2ML IJ SOLN
4.0000 mg | Freq: Once | INTRAMUSCULAR | Status: AC
  Administered 2016-12-07: 4 mg via INTRAVENOUS
  Filled 2016-12-07: qty 2

## 2016-12-07 MED ORDER — MORPHINE SULFATE (PF) 4 MG/ML IV SOLN
INTRAVENOUS | Status: AC
  Administered 2016-12-07: 4 mg via INTRAVENOUS
  Filled 2016-12-07: qty 1

## 2016-12-07 MED ORDER — IOPAMIDOL (ISOVUE-300) INJECTION 61%
30.0000 mL | Freq: Once | INTRAVENOUS | Status: AC | PRN
  Administered 2016-12-07: 30 mL via ORAL

## 2016-12-07 MED ORDER — ONDANSETRON HCL 4 MG/2ML IJ SOLN
INTRAMUSCULAR | Status: AC
  Administered 2016-12-07: 4 mg via INTRAVENOUS
  Filled 2016-12-07: qty 2

## 2016-12-07 MED ORDER — MORPHINE SULFATE (PF) 4 MG/ML IV SOLN
INTRAVENOUS | Status: AC
Start: 2016-12-07 — End: 2016-12-07
  Administered 2016-12-07: 4 mg via INTRAVENOUS
  Filled 2016-12-07: qty 1

## 2016-12-07 MED ORDER — SODIUM CHLORIDE 0.9 % IV BOLUS (SEPSIS)
1000.0000 mL | Freq: Once | INTRAVENOUS | Status: AC
  Administered 2016-12-07: 1000 mL via INTRAVENOUS

## 2016-12-07 MED ORDER — PROMETHAZINE HCL 25 MG PO TABS
25.0000 mg | ORAL_TABLET | Freq: Once | ORAL | Status: AC
  Administered 2016-12-07: 25 mg via ORAL
  Filled 2016-12-07: qty 1

## 2016-12-07 MED ORDER — IOPAMIDOL (ISOVUE-300) INJECTION 61%
100.0000 mL | Freq: Once | INTRAVENOUS | Status: AC | PRN
  Administered 2016-12-07: 100 mL via INTRAVENOUS

## 2016-12-07 MED ORDER — HYDROCODONE-ACETAMINOPHEN 5-325 MG PO TABS: 1.0000 | ORAL_TABLET | Freq: Four times a day (QID) | ORAL | 0 refills | Status: DC | PRN

## 2016-12-07 MED ORDER — MORPHINE SULFATE (PF) 4 MG/ML IV SOLN
4.0000 mg | Freq: Once | INTRAVENOUS | Status: AC
  Administered 2016-12-07: 4 mg via INTRAVENOUS
  Filled 2016-12-07: qty 1

## 2016-12-07 MED ORDER — ONDANSETRON HCL 4 MG/2ML IJ SOLN
4.0000 mg | Freq: Once | INTRAMUSCULAR | Status: AC
  Administered 2016-12-07: 4 mg via INTRAVENOUS

## 2016-12-07 NOTE — ED Notes (Signed)
Patient transported to Ultrasound 

## 2016-12-07 NOTE — ED Provider Notes (Signed)
[facility]  I accepted care from Dr. Lamont Snowballifenbark ____________________________________________    LABS (pertinent positives/negatives)  Labs Reviewed  WET PREP, GENITAL - Abnormal; Notable for the following:       Result Value   WBC, Wet Prep HPF POC MODERATE (*)    All other components within normal limits  COMPREHENSIVE METABOLIC PANEL - Abnormal; Notable for the following:    Glucose, Bld 140 (*)    Creatinine, Ser 0.37 (*)    Total Protein 8.7 (*)    All other components within normal limits  CBC - Abnormal; Notable for the following:    RBC 5.39 (*)    All other components within normal limits  URINALYSIS, COMPLETE (UACMP) WITH MICROSCOPIC - Abnormal; Notable for the following:    Color, Urine YELLOW (*)    APPearance HAZY (*)    Protein, ur 30 (*)    Leukocytes, UA LARGE (*)    Squamous Epithelial / LPF 6-30 (*)    All other components within normal limits  URINE CULTURE  CHLAMYDIA/NGC RT PCR ([facility] ONLY)  LIPASE, BLOOD  POC URINE PREG, ED  POCT PREGNANCY, URINE     ____________________________________________    RADIOLOGY All xrays were viewed by me. Imaging interpreted by radiologist.  US pelvis:  IMPRESSION: 1. Noted adjacent to the right ovary in the right adnexa is a possible complex mass measuring up to 5.7 cm. This could be ovarian or non ovarian in origin. Ectopic pregnancy cannot be excluded and pregnancy test suggested for further evaluation. Follow-up pelvic ultrasound suggested to demonstrate resolution. IV and oral contrast enhanced CT of the abdomen pelvis can be obtained to further evaluate if need be.  2. Interim resolution of previously identified left ovarian large complex mass noted on ultrasound of 02/28/2016. Two small follicular cysts are are noted in the left ovary.  CT abd/pel:  IMPRESSION: Changes are noted in the right adnexa similar to that seen on recent ultrasound examination likely representing  complex cystic change. No definitive solid mass lesion is noted. Follow-up examination is recommended in 6-8 weeks to assess for resolution.  No other focal abnormality is seen. ____________________________________________   PROCEDURES  Procedure(s) performed: None  Critical Care performed: None  ____________________________________________   INITIAL IMPRESSION / ASSESSMENT AND PLAN / ED COURSE   Pertinent labs & imaging results that were available during my care of the patient were reviewed by me and considered in my medical decision making (see chart for details).   Patient awaiting US pelvis for llq pain and diarrhea.  Patient has gastroenterologist for some chronic intestinal issues.  Dr. Lamont Snowballifenbark had discussed with patient reassuring labs and patient/doctor had discussed no CT due to risk/benefit.    I reviewed the ultrasound results with the patient, her pain complaints are bilateral lower abdomen, more so on the left and feels like intestinal issues to her. We discussed the ultrasound showing complex cystic mass in the right lower quadrant without being able to say definitively that ovarian in nature. Patient did want to go ahead with CT scanning for further evaluation.  CT scan sig just that the right lower quadrant findings are likely due to a complicated ovarian cyst, follow-up with OB/GYN for repeat ultrasound for resolution.  There is no evidence of diverticulitis or appendicitis or other acute intra-abdominal emergency that would explain the source of her pain.  Dr. Lamont Snowballifenbark had discussed with her potentially treating for presumed colitis, but given the CT scan shows no evidence of such,  I am not recommending antibiotic treatment. Given her issues with the GI system, I think she probably should follow-up with her gastroenterologist.  I discussed with her that doing a pelvic exam is warranted.  At this point, uncertain cause, but I am most suspicious of irritable  bowel. She has been pretty significant discomfort here. I did check the [place] control certain substance database, no prior narcotic prescriptions. I am going to prescribe her just several days of Norco to help with acute pain.  Pelvic exam she did have a small amount of vaginal discharge and no cervicitis on exam. Not clinically concerned at this point for PID. Gonorrhea and Chlamydia not back at time of patient discharge.   CONSULTATIONS:  None   Patient / Family / Caregiver informed of clinical course, medical decision-making process, and agree with plan.   I discussed return precautions, follow-up instructions, and discharged instructions with patient and/or family.   Discharge Instructions:  Although no certain cause was found, your examine evaluation are reassuring in the emergency department today.  Return to emergency department immediately for any worsening condition including new or worsening abdominal pain, black or bloody stools, vomiting blood, fever, vaginal discharge, or any other symptoms concerning to you.  You do have what appears to be a complex ovarian cyst on the right side, radiologist had recommended 6-8 weeks repeat imaging, likely ultrasound, please discuss this with your primary care doctor and/or an OB/GYN doctor to make this follow-up appointment.  ____________________________________________   FINAL CLINICAL IMPRESSION(S) / ED DIAGNOSES  Final diagnoses:  Lower abdominal pain  Cyst of right ovary        Governor Rooks, MD 12/07/16 1435

## 2016-12-07 NOTE — ED Notes (Signed)
Pt r/f US 

## 2016-12-07 NOTE — ED Notes (Signed)
Patient states her pain has not decreased with any medication given.  MD notified.

## 2016-12-07 NOTE — ED Notes (Signed)
Patient transported to CT 

## 2016-12-07 NOTE — Discharge Instructions (Signed)
Although no certain cause was found, your examine evaluation are reassuring in the emergency department today.  Return to emergency department immediately for any worsening condition including new or worsening abdominal pain, black or bloody stools, vomiting blood, fever, vaginal discharge, or any other symptoms concerning to you.  You do have what appears to be a complex ovarian cyst on the right side, radiologist had recommended 6-8 weeks repeat imaging, likely ultrasound, please discuss this with your primary care doctor and/or an OB/GYN doctor to make this follow-up appointment.

## 2016-12-07 NOTE — ED Notes (Signed)
ED Provider at bedside. 

## 2016-12-07 NOTE — ED Triage Notes (Signed)
Pt presents to ED with c/o intermittent sharp generalized abd pain for the past 2+weeks and diarrhea daily multiple times a day. Hx of infectious colitis and ovarian cysts. States this pain feels similar. Denies vomiting.

## 2016-12-07 NOTE — ED Provider Notes (Signed)
[facility] Emergency Department Provider Note  ____________________________________________   First MD Initiated Contact with Patient 12/07/16 0500     (approximate)  I have reviewed the triage vital signs and the nursing notes.   HISTORY  Chief Complaint Abdominal Pain    HPI Vicki Dominguez is a 31 y.o. female Tums to the emergency department with 1 day of cramping left sided constant abdominal pain. She describes the pain is bloating and nonradiating. She also has discomfort on her right side. She has a past medical history of multiple ovarian cysts and has had a surgical removal of a left-sided cyst in the past. She has also had a diagnosis of colitis last year for which she took a course of antibiotics. She had an endoscopy showing eosinophilic esophagitis and a colonoscopy which was reportedly normal. No history of abdominal surgery aside from the cyst removal.   Past Medical History:  Diagnosis Date  . Anxiety   . Arthritis    hands, legs  . Colitis   . Depression   . Family history of adverse reaction to anesthesia    Mother difficult to awaken after Succinycholine  . GERD (gastroesophageal reflux disease)   . Migraine    Rare.  Tension HA 3x/wk  . Ovarian cyst   . TMJ arthritis   . TMJ syndrome     Patient Active Problem List   Diagnosis Date Noted  . Abnormal findings-gastrointestinal tract   . ADD (attention deficit disorder) 06/07/2016  . Left ovarian cyst 03/07/2016  . Acute colitis 02/28/2016  . Anxiety 02/28/2016  . GERD (gastroesophageal reflux disease) 02/28/2016  . Migraine 02/28/2016    Past Surgical History:  Procedure Laterality Date  . COLONOSCOPY WITH PROPOFOL N/A 07/03/2016   Procedure: COLONOSCOPY WITH PROPOFOL;  Surgeon: Midge Minium, MD;  Location: [facility];  Service: Endoscopy;  Laterality: N/A;  . ESOPHAGOGASTRODUODENOSCOPY N/A 03/06/2016   Procedure: ESOPHAGOGASTRODUODENOSCOPY (EGD);  Surgeon:  Scot Jun, MD;  Location: [facility];  Service: Endoscopy;  Laterality: N/A;  . LAPAROSCOPIC OVARIAN CYSTECTOMY Left 03/07/2016   Procedure: LAPAROSCOPIC OVARIAN CYSTECTOMY;  Surgeon: Conard Novak, MD;  Location: [facility];  Service: Gynecology;  Laterality: Left;  . OVARIAN CYST REMOVAL    . TEMPOROMANDIBULAR JOINT SURGERY      Prior to Admission medications   Medication Sig Start Date End Date Taking? Authorizing Provider  acetaminophen (TYLENOL) 500 MG tablet Take 500 mg by mouth every 6 (six) hours as needed.    Historical Provider, MD  alprazolam Prudy Feeler) 2 MG tablet Take 2 mg by mouth 2 (two) times daily. 02/09/16   Historical Provider, MD  benzonatate (TESSALON) 200 MG capsule Take 1 capsule (200 mg total) by mouth 3 (three) times daily as needed. 09/02/16   Hassan Rowan, MD  celecoxib (CELEBREX) 100 MG capsule Take 100 mg by mouth 2 (two) times daily.    Historical Provider, MD  celecoxib (CELEBREX) 200 MG capsule Take 1 capsule (200 mg total) by mouth 2 (two) times daily as needed for moderate pain. Take with food 09/02/16   Hassan Rowan, MD  dexlansoprazole (DEXILANT) 60 MG capsule Take 1 capsule (60 mg total) by mouth daily. 07/03/16   Midge Minium, MD  dicyclomine (BENTYL) 10 MG capsule TAKE 2 CAPSULES (20 MG TOTAL) BY MOUTH EVERY 6 (SIX) HOURS. 09/07/16   Midge Minium, MD  fexofenadine-pseudoephedrine (ALLEGRA-D ALLERGY & CONGESTION) 180-240 MG 24 hr tablet Take 1 tablet by mouth daily. 09/02/16   Dennard Nip  Thurmond ButtsWade, MD  ibuprofen (ADVIL,MOTRIN) 800 MG tablet Take 800 mg by mouth every 8 (eight) hours as needed.    Historical Provider, MD  Investigational - Study Medication 2 (two) times daily. Additional Study Details:  Propranolol versus placebo for TMJ    Historical Provider, MD  lisdexamfetamine (VYVANSE) 40 MG capsule TAKE 1 CAPSULE BY MOUTH EVERY MORNING 04/17/16   Historical Provider, MD  MAGNESIUM PO Take by mouth daily.    Historical Provider, MD  Multiple Vitamin  (MULTIVITAMIN) tablet Take 1 tablet by mouth daily.    Historical Provider, MD  pantoprazole (PROTONIX) 40 MG tablet Take 1 tablet (40 mg total) by mouth 2 (two) times daily. 03/08/16   Alford Highlandichard Wieting, MD  PARoxetine (PAXIL-CR) 25 MG 24 hr tablet Take 25 mg by mouth daily. 02/09/16   Historical Provider, MD  promethazine (PHENERGAN) 25 MG tablet Take 25 mg by mouth every 6 (six) hours as needed. for nausea 04/09/16   Historical Provider, MD  sucralfate (CARAFATE) 1 GM/10ML suspension Take 10 mLs (1 g total) by mouth 4 (four) times daily -  with meals and at bedtime. 07/12/16   Midge Miniumarren Wohl, MD  SUMAtriptan (IMITREX) 50 MG tablet Take 50 mg by mouth. 05/10/16 05/11/17  Historical Provider, MD    Allergies Succinylcholine chloride and Tape  Family History  Problem Relation Age of Onset  . Heart attack Mother   . Hypertension Father     Social History Social History  Substance Use Topics  . Smoking status: Current Every Day Smoker    Packs/day: 0.50    Years: 10.00    Types: Cigarettes  . Smokeless tobacco: Never Used  . Alcohol use No     Comment: occasionally    Review of Systems Constitutional: No fever/chills Eyes: No visual changes. ENT: No sore throat. Cardiovascular: Denies chest pain. Respiratory: Denies shortness of breath. Gastrointestinal: Positive abdominal pain.  No nausea, no vomiting.  No diarrhea.  No constipation. Genitourinary: Negative for dysuria. Musculoskeletal: Negative for back pain. Skin: Negative for rash. Neurological: Negative for headaches, focal weakness or numbness.  10-point ROS otherwise negative.  ____________________________________________   PHYSICAL EXAM:  VITAL SIGNS: ED Triage Vitals  Enc Vitals Group     BP 12/07/16 0423 (!) 146/101     Pulse Rate 12/07/16 0423 (!) 105     Resp 12/07/16 0423 20     Temp 12/07/16 0423 97.7 F (36.5 C)     Temp Source 12/07/16 0423 Oral     SpO2 12/07/16 0423 100 %     Weight 12/07/16 0426 140 lb  (63.5 kg)     Height 12/07/16 0426 5\' 2"  (1.575 m)     Head Circumference --      Peak Flow --      Pain Score 12/07/16 0427 7     Pain Loc --      Pain Edu? --      Excl. in GC? --     Constitutional: Alert and oriented x 4 well appearing nontoxic no diaphoresis speaks in full, clear sentences Eyes: PERRL EOMI. Head: Atraumatic. Nose: No congestion/rhinnorhea. Mouth/Throat: No trismus Neck: No stridor.   Cardiovascular: Normal rate, regular rhythm. Grossly normal heart sounds.  Good peripheral circulation. Respiratory: Normal respiratory effort.  No retractions. Lungs CTAB and moving good air Gastrointestinal: Soft nondistended She is tender in her left lower quadrant greater than right lower quadrant. Mild guarding no peritonitis no McBurney's tenderness negative Murphy's to vertebral tenderness Musculoskeletal: No lower extremity  edema   Neurologic:  Normal speech and language. No gross focal neurologic deficits are appreciated. Skin:  Skin is warm, dry and intact. No rash noted. Psychiatric: Mood and affect are normal. Speech and behavior are normal.    ____________________________________________   DIFFERENTIAL  Diverticulitis, colitis, appendicitis, ovarian torsion ____________________________________________   LABS (all labs ordered are listed, but only abnormal results are displayed)  Labs Reviewed  COMPREHENSIVE METABOLIC PANEL - Abnormal; Notable for the following:       Result Value   Glucose, Bld 140 (*)    Creatinine, Ser 0.37 (*)    Total Protein 8.7 (*)    All other components within normal limits  CBC - Abnormal; Notable for the following:    RBC 5.39 (*)    All other components within normal limits  URINALYSIS, COMPLETE (UACMP) WITH MICROSCOPIC - Abnormal; Notable for the following:    Color, Urine YELLOW (*)    APPearance HAZY (*)    Protein, ur 30 (*)    Leukocytes, UA LARGE (*)    Squamous Epithelial / LPF 6-30 (*)    All other components within  normal limits  URINE CULTURE  LIPASE, BLOOD  POC URINE PREG, ED  POCT PREGNANCY, URINE    Not pregnant no white count __________________________________________  EKG   ____________________________________________  RADIOLOGY   ____________________________________________   PROCEDURES  Procedure(s) performed: no  Procedures  Critical Care performed: no  ____________________________________________   INITIAL IMPRESSION / ASSESSMENT AND PLAN / ED COURSE  Pertinent labs & imaging results that were available during my care of the patient were reviewed by me and considered in my medical decision making (see chart for details).  The patient arrives uncomfortable appearing with left lower quadrant tenderness. We had a lengthy discussion together regarding the possibility of recurrent colitis and the utility of a CT scan. Her pain sounds more like colitis then ovarian, however she does have multiple ovarian cysts in the past. Together we agreed on labs, urinalysis, and a pelvic ultrasound. If the ultrasound is unrevealing we will give her a trial of Augmentin and Flagyl and refer her back to her primary care physician and gastroenterologist. Disposition pending imaging.      ____________________________________________   FINAL CLINICAL IMPRESSION(S) / ED DIAGNOSES  Final diagnoses:  LLQ pain      NEW MEDICATIONS STARTED DURING THIS VISIT:  New Prescriptions   No medications on file     Note:  This document was prepared using Dragon voice recognition software and may include unintentional dictation errors.     Merrily Brittle, MD 12/07/16 [id number]

## 2016-12-07 NOTE — ED Notes (Signed)
MD made aware that patient would like something for pain and nausea.  Informed her that CT scan results are back.  Educated patient on this matter and that we would look over things and make a decision from there.  Patient verbalized understanding of this information.

## 2016-12-08 LAB — URINE CULTURE

## 2016-12-18 ENCOUNTER — Ambulatory Visit: Payer: Self-pay | Admitting: Obstetrics and Gynecology

## 2016-12-24 ENCOUNTER — Emergency Department: Payer: Medicaid Other

## 2016-12-24 ENCOUNTER — Encounter: Payer: Self-pay | Admitting: Emergency Medicine

## 2016-12-24 ENCOUNTER — Emergency Department
Admission: EM | Admit: 2016-12-24 | Discharge: 2016-12-24 | Disposition: A | Discharge status: Home / Self Care | Payer: Medicaid Other | Attending: Emergency Medicine | Admitting: Emergency Medicine

## 2016-12-24 DIAGNOSIS — N83209 Unspecified ovarian cyst, unspecified side: Secondary | ICD-10-CM | POA: Diagnosis not present

## 2016-12-24 DIAGNOSIS — F17210 Nicotine dependence, cigarettes, uncomplicated: Secondary | ICD-10-CM | POA: Insufficient documentation

## 2016-12-24 DIAGNOSIS — R1030 Lower abdominal pain, unspecified: Secondary | ICD-10-CM

## 2016-12-24 DIAGNOSIS — Z791 Long term (current) use of non-steroidal anti-inflammatories (NSAID): Secondary | ICD-10-CM | POA: Insufficient documentation

## 2016-12-24 DIAGNOSIS — Z79899 Other long term (current) drug therapy: Secondary | ICD-10-CM | POA: Insufficient documentation

## 2016-12-24 LAB — CBC
HEMATOCRIT: 40.4 % (ref 35.0–47.0)
HEMOGLOBIN: 13.7 g/dL (ref 12.0–16.0)
MCH: 29.1 pg (ref 26.0–34.0)
MCHC: 34 g/dL (ref 32.0–36.0)
MCV: 85.5 fL (ref 80.0–100.0)
Platelets: 200 10*3/uL (ref 150–440)
RBC: 4.73 MIL/uL (ref 3.80–5.20)
RDW: 13.3 % (ref 11.5–14.5)
WBC: 6.4 10*3/uL (ref 3.6–11.0)

## 2016-12-24 LAB — URINALYSIS, COMPLETE (UACMP) WITH MICROSCOPIC
Bilirubin Urine: NEGATIVE
Glucose, UA: NEGATIVE mg/dL
KETONES UR: NEGATIVE mg/dL
Nitrite: NEGATIVE
Protein, ur: 30 mg/dL — AB
Specific Gravity, Urine: 1.013 (ref 1.005–1.030)
pH: 5 (ref 5.0–8.0)

## 2016-12-24 LAB — COMPREHENSIVE METABOLIC PANEL
ALBUMIN: 4.4 g/dL (ref 3.5–5.0)
ALK PHOS: 76 U/L (ref 38–126)
ALT: 12 U/L — ABNORMAL LOW (ref 14–54)
ANION GAP: 8 (ref 5–15)
AST: 20 U/L (ref 15–41)
BUN: 14 mg/dL (ref 6–20)
CALCIUM: 9.3 mg/dL (ref 8.9–10.3)
CO2: 24 mmol/L (ref 22–32)
Chloride: 105 mmol/L (ref 101–111)
Creatinine, Ser: 0.56 mg/dL (ref 0.44–1.00)
GFR calc Af Amer: >60 mL/min (ref >60)
GFR calc non Af Amer: >60 mL/min (ref >60)
GLUCOSE: 126 mg/dL — AB (ref 65–99)
Potassium: 3.1 mmol/L — ABNORMAL LOW (ref 3.5–5.1)
SODIUM: 137 mmol/L (ref 135–145)
Total Bilirubin: 0.6 mg/dL (ref 0.3–1.2)
Total Protein: 7.5 g/dL (ref 6.5–8.1)

## 2016-12-24 LAB — LIPASE, BLOOD: Lipase: 19 U/L (ref 11–51)

## 2016-12-24 LAB — POCT PREGNANCY, URINE: Preg Test, Ur: NEGATIVE

## 2016-12-24 MED ORDER — MORPHINE SULFATE (PF) 4 MG/ML IV SOLN
4.0000 mg | Freq: Once | INTRAVENOUS | Status: AC
  Administered 2016-12-24: 4 mg via INTRAVENOUS

## 2016-12-24 MED ORDER — IOPAMIDOL (ISOVUE-300) INJECTION 61%
30.0000 mL | Freq: Once | INTRAVENOUS | Status: AC | PRN
  Administered 2016-12-24: 30 mL via ORAL

## 2016-12-24 MED ORDER — SODIUM CHLORIDE 0.9 % IV BOLUS (SEPSIS)
1000.0000 mL | Freq: Once | INTRAVENOUS | Status: AC
  Administered 2016-12-24: 1000 mL via INTRAVENOUS

## 2016-12-24 MED ORDER — KETOROLAC TROMETHAMINE 60 MG/2ML IM SOLN
INTRAMUSCULAR | Status: AC
  Filled 2016-12-24: qty 2

## 2016-12-24 MED ORDER — ONDANSETRON HCL 4 MG/2ML IJ SOLN
4.0000 mg | Freq: Once | INTRAMUSCULAR | Status: AC
  Administered 2016-12-24: 4 mg via INTRAVENOUS
  Filled 2016-12-24: qty 2

## 2016-12-24 MED ORDER — MORPHINE SULFATE (PF) 4 MG/ML IV SOLN
INTRAVENOUS | Status: AC
  Administered 2016-12-24: 4 mg via INTRAVENOUS
  Filled 2016-12-24: qty 1

## 2016-12-24 MED ORDER — OXYCODONE-ACETAMINOPHEN 5-325 MG PO TABS
ORAL_TABLET | ORAL | Status: DC
Start: 2016-12-24 — End: 2016-12-24
  Filled 2016-12-24: qty 1

## 2016-12-24 MED ORDER — HYDROCODONE-ACETAMINOPHEN 5-325 MG PO TABS: 1.0000 | ORAL_TABLET | Freq: Four times a day (QID) | ORAL | 0 refills | Status: DC | PRN

## 2016-12-24 MED ORDER — HYDROCODONE-ACETAMINOPHEN 5-325 MG PO TABS
1.0000 | ORAL_TABLET | Freq: Once | ORAL | Status: AC
  Administered 2016-12-24: 1 via ORAL
  Filled 2016-12-24: qty 1

## 2016-12-24 MED ORDER — MORPHINE SULFATE (PF) 4 MG/ML IV SOLN
4.0000 mg | Freq: Once | INTRAVENOUS | Status: AC
  Administered 2016-12-24: 4 mg via INTRAVENOUS
  Filled 2016-12-24: qty 1

## 2016-12-24 MED ORDER — OXYCODONE-ACETAMINOPHEN 5-325 MG PO TABS
1.0000 | ORAL_TABLET | ORAL | Status: DC | PRN
  Administered 2016-12-24: 1 via ORAL

## 2016-12-24 MED ORDER — IOPAMIDOL (ISOVUE-300) INJECTION 61%
100.0000 mL | Freq: Once | INTRAVENOUS | Status: AC | PRN
Start: 2016-12-24 — End: 2016-12-24
  Administered 2016-12-24: 100 mL via INTRAVENOUS

## 2016-12-24 MED ORDER — KETOROLAC TROMETHAMINE 30 MG/ML IJ SOLN
15.0000 mg | Freq: Once | INTRAMUSCULAR | Status: AC
  Administered 2016-12-24: 15 mg via INTRAVENOUS
  Filled 2016-12-24 (×2): qty 1

## 2016-12-24 MED ORDER — ONDANSETRON 4 MG PO TBDP: 4.0000 mg | ORAL_TABLET | Freq: Four times a day (QID) | ORAL | 0 refills | Status: DC | PRN

## 2016-12-24 NOTE — Discharge Instructions (Signed)
You were seen in the emergency room for abdominal pain. I suspect this may be due to the rupture of one of your ovarian cysts, but can not be certain at this time.   Please return to the emergency room right away if you are to develop a fever, severe nausea, heavy vaginal bleeding, your pain becomes severe or worsens, you are unable to keep food down, begin vomiting any dark or bloody fluid, you develop any dark or bloody stools, feel dehydrated, or other new concerns or symptoms arise.

## 2016-12-24 NOTE — ED Provider Notes (Signed)
[facility] Emergency Department Provider Note ____________________________________________   First MD Initiated Contact with Patient 12/24/16 [phone]     (approximate)  I have reviewed the triage vital signs and the nursing notes.  HISTORY  Chief Complaint Abdominal Pain  HPI ADELYNNE JOERGER is a 31 y.o. female here for evaluation of lower abdominal pain. Patient reports for about the last few weeks she's been having pain and issues with ovarian cysts. She has follow-up set up with gynecology would not till the end of this month. She reports that 2 days ago she began having slowly increasing but severe lower abdominal discomfort, feels like it's in both lower sides of the pelvis and radiates to the back. Not associated with any vaginal bleeding or discharge. Some nausea but no vomiting. No upper abdominal pain. No fevers or chills. No chest pain or trouble breathing.  Sexually active, with one steady partner. Denies pain with intercourse.  Scribe is moderate to severe, crampy and achy pain in the lower abdomen. It is constant  Past Medical History:  Diagnosis Date  . Anxiety   . Arthritis    hands, legs  . Colitis   . Depression   . Family history of adverse reaction to anesthesia    Mother difficult to awaken after Succinycholine  . GERD (gastroesophageal reflux disease)   . Migraine    Rare.  Tension HA 3x/wk  . Ovarian cyst   . TMJ arthritis   . TMJ syndrome     Patient Active Problem List   Diagnosis Date Noted  . Abnormal findings-gastrointestinal tract   . ADD (attention deficit disorder) 06/07/2016  . Left ovarian cyst 03/07/2016  . Acute colitis 02/28/2016  . Anxiety 02/28/2016  . GERD (gastroesophageal reflux disease) 02/28/2016  . Migraine 02/28/2016    Past Surgical History:  Procedure Laterality Date  . COLONOSCOPY WITH PROPOFOL N/A 07/03/2016   Procedure: COLONOSCOPY WITH PROPOFOL;  Surgeon: Midge Minium, MD;  Location: [facility];  Service: Endoscopy;  Laterality: N/A;  . ESOPHAGOGASTRODUODENOSCOPY N/A 03/06/2016   Procedure: ESOPHAGOGASTRODUODENOSCOPY (EGD);  Surgeon: Scot Jun, MD;  Location: [facility] END[facility]COPY;  Service: Endoscopy;  Laterality: N/A;  . LAPAROSCOPIC OVARIAN CYSTECTOMY Left 03/07/2016   Procedure: LAPAROSCOPIC OVARIAN CYSTECTOMY;  Surgeon: Conard Novak, MD;  Location: [facility] ORS;  Service: Gynecology;  Laterality: Left;  . OVARIAN CYST REMOVAL    . TEMPOROMANDIBULAR JOINT SURGERY      Prior to Admission medications   Medication Sig Start Date End Date Taking? Authorizing Provider  ALPRAZolam Prudy Feeler) 1 MG tablet Take 1 mg by mouth 2 (two) times daily.  02/09/16   Historical Provider, MD  ascorbic acid (VITAMIN C) 500 MG tablet Take 500 mg by mouth daily.    Historical Provider, MD  benzonatate (TESSALON) 200 MG capsule Take 1 capsule (200 mg total) by mouth 3 (three) times daily as needed. 09/02/16   Hassan Rowan, MD  celecoxib (CELEBREX) 200 MG capsule Take 1 capsule (200 mg total) by mouth 2 (two) times daily as needed for moderate pain. Take with food 09/02/16   Hassan Rowan, MD  cholecalciferol (VITAMIN D) 1000 units tablet Take 1,000 Units by mouth daily.    Historical Provider, MD  dexlansoprazole (DEXILANT) 60 MG capsule Take 1 capsule (60 mg total) by mouth daily. 07/03/16   Midge Minium, MD  dicyclomine (BENTYL) 10 MG capsule TAKE 2 CAPSULES (20 MG TOTAL) BY MOUTH EVERY 6 (SIX) HOURS. 09/07/16   Midge Minium, MD  fexofenadine-pseudoephedrine (ALLEGRA-D ALLERGY & CONGESTION) 180-240 MG 24 hr tablet Take 1 tablet by mouth daily. Patient not taking: Reported on 12/07/2016 09/02/16   Hassan Rowan, MD  HYDROcodone-acetaminophen (NORCO/VICODIN) 5-325 MG tablet Take 1 tablet by mouth every 6 (six) hours as needed for moderate pain. 12/24/16   Sharyn Creamer, MD  ibuprofen (ADVIL,MOTRIN) 800 MG tablet Take 800 mg by mouth every 8 (eight) hours as needed.    Historical Provider, MD  lisdexamfetamine  (VYVANSE) 40 MG capsule TAKE 1 CAPSULE BY MOUTH EVERY MORNING 04/17/16   Historical Provider, MD  pantoprazole (PROTONIX) 40 MG tablet Take 1 tablet (40 mg total) by mouth 2 (two) times daily. 03/08/16   Alford Highland, MD  promethazine (PHENERGAN) 25 MG tablet Take 25 mg by mouth every 6 (six) hours as needed. for nausea 04/09/16   Historical Provider, MD  sucralfate (CARAFATE) 1 GM/10ML suspension Take 10 mLs (1 g total) by mouth 4 (four) times daily -  with meals and at bedtime. 07/12/16   Midge Minium, MD  SUMAtriptan (IMITREX) 50 MG tablet Take 50 mg by mouth. 05/10/16 05/11/17  Historical Provider, MD    Allergies Succinylcholine chloride and Tape  Family History  Problem Relation Age of Onset  . Heart attack Mother   . Hypertension Father     Social History Social History  Substance Use Topics  . Smoking status: Current Every Day Smoker    Packs/day: 0.50    Years: 10.00    Types: Cigarettes  . Smokeless tobacco: Never Used  . Alcohol use No     Comment: occasionally    Review of Systems Constitutional: No fever/chills. Eyes: No visual changes. ENT: No sore throat. Cardiovascular: Denies chest pain. Respiratory: Denies shortness of breath. Gastrointestinal: No vomiting.  No diarrhea.  No constipation.Denies pregnancy. Genitourinary: Negative for dysuria. No dark urine. No abnormal odor. Musculoskeletal: Negative for back pain. Skin: Negative for rash. Neurological: Negative for headaches, focal weakness or numbness.  10-point ROS otherwise negative.  ____________________________________________   PHYSICAL EXAM:  VITAL SIGNS: ED Triage Vitals [12/24/16 0340]  Enc Vitals Group     BP 117/84     Pulse Rate (!) 121     Resp 18     Temp 97.9 F (36.6 C)     Temp Source Oral     SpO2 100 %     Weight 135 lb (61.2 kg)     Height  (1.575 m)     Head Circumference      Peak Flow      Pain Score 9     Pain Loc      Pain Edu?      Excl. in GC?      Constitutional: Alert and oriented. Appears in no distress, does report and appears to have at least mild to moderate lower abdominal pain Eyes: Conjunctivae are normal. PERRL. EOMI. Head: Atraumatic. Nose: No congestion/rhinnorhea. Mouth/Throat: Mucous membranes are slightly dry.  Oropharynx non-erythematous. Neck: No stridor.   Cardiovascular: Normal rate, regular rhythm. Grossly normal heart sounds.  Good peripheral circulation. Respiratory: Normal respiratory effort.  No retractions. Lungs CTAB. Gastrointestinal: Soft and nontender throughout the upper abdomen and epigastrium, but reports moderate pain to palpation in the lower abdomen bilateral. No distention.  Musculoskeletal: No lower extremity tenderness nor edema.  No joint effusions. Neurologic:  Normal speech and language. No gross focal neurologic deficits are appreciated. No gait instability. Skin:  Skin is warm, dry and intact. No rash noted. Psychiatric: Mood  and affect are normal. Speech and behavior are normal.  Discussed performing another pelvic exam, patient. Had one 2 weeks ago with STD testing and after discussing, patient does not wish to have a repeated the exam today. ____________________________________________   LABS (all labs ordered are listed, but only abnormal results are displayed)  Labs Reviewed  COMPREHENSIVE METABOLIC PANEL - Abnormal; Notable for the following:       Result Value   Potassium 3.1 (*)    Glucose, Bld 126 (*)    ALT 12 (*)    All other components within normal limits  URINALYSIS, COMPLETE (UACMP) WITH MICROSCOPIC - Abnormal; Notable for the following:    Color, Urine YELLOW (*)    APPearance HAZY (*)    Hgb urine dipstick LARGE (*)    Protein, ur 30 (*)    Leukocytes, UA TRACE (*)    Bacteria, UA RARE (*)    Squamous Epithelial / LPF 0-5 (*)    All other components within normal limits  URINE CULTURE  LIPASE, BLOOD  CBC  POCT PREGNANCY, URINE    ____________________________________________  EKG   ____________________________________________  RADIOLOGY  US Transvaginal Non-ob  Result Date: 12/24/2016 CLINICAL DATA:  Lower abdominal pain. EXAM: TRANSABDOMINAL AND TRANSVAGINAL ULTRASOUND OF PELVIS DOPPLER ULTRASOUND OF OVARIES TECHNIQUE: Both transabdominal and transvaginal ultrasound examinations of the pelvis were performed. Transabdominal technique was performed for global imaging of the pelvis including uterus, ovaries, adnexal regions, and pelvic cul-de-sac. It was necessary to proceed with endovaginal exam following the transabdominal exam to visualize the endometrium and ovaries. Color and duplex Doppler ultrasound was utilized to evaluate blood flow to the ovaries. COMPARISON:  CT abdomen 12/07/2016 FINDINGS: Uterus Measurements: 8.1 x 3.9 x 5.5 cm. No fibroids or other mass visualized. Endometrium Thickness: 3 mm.  No focal abnormality visualized. Right ovary Measurements: 2.8 x 2.4 x 2.1 cm. Normal appearance/no adnexal mass. Left ovary Measurements: 4.2 x 3 x 2.7 cm. Normal appearance/no adnexal mass. Pulsed Doppler evaluation of both ovaries demonstrates normal low-resistance arterial and venous waveforms. Other findings Trace pelvic free fluid. IMPRESSION: 1. No ovarian torsion. 2. No focal pelvic abnormality. Electronically Signed   By: Elige Ko   On: 12/24/2016 11:20   US Pelvis Complete  Result Date: 12/24/2016 CLINICAL DATA:  Lower abdominal pain. EXAM: TRANSABDOMINAL AND TRANSVAGINAL ULTRASOUND OF PELVIS DOPPLER ULTRASOUND OF OVARIES TECHNIQUE: Both transabdominal and transvaginal ultrasound examinations of the pelvis were performed. Transabdominal technique was performed for global imaging of the pelvis including uterus, ovaries, adnexal regions, and pelvic cul-de-sac. It was necessary to proceed with endovaginal exam following the transabdominal exam to visualize the endometrium and ovaries. Color and duplex Doppler  ultrasound was utilized to evaluate blood flow to the ovaries. COMPARISON:  CT abdomen 12/07/2016 FINDINGS: Uterus Measurements: 8.1 x 3.9 x 5.5 cm. No fibroids or other mass visualized. Endometrium Thickness: 3 mm.  No focal abnormality visualized. Right ovary Measurements: 2.8 x 2.4 x 2.1 cm. Normal appearance/no adnexal mass. Left ovary Measurements: 4.2 x 3 x 2.7 cm. Normal appearance/no adnexal mass. Pulsed Doppler evaluation of both ovaries demonstrates normal low-resistance arterial and venous waveforms. Other findings Trace pelvic free fluid. IMPRESSION: 1. No ovarian torsion. 2. No focal pelvic abnormality. Electronically Signed   By: Elige Ko   On: 12/24/2016 11:20   Ct Abdomen Pelvis W Contrast  Result Date: 12/24/2016 CLINICAL DATA:  Pelvic pain for few weeks, history of ovarian cysts and excision, question cyst rupture, history colitis, normal white count, history  GERD EXAM: CT ABDOMEN AND PELVIS WITH CONTRAST TECHNIQUE: Multidetector CT imaging of the abdomen and pelvis was performed using the standard protocol following bolus administration of intravenous contrast. Sagittal and coronal MPR images reconstructed from axial data set. CONTRAST:  ISOVUE-300 IOPAMIDOL (ISOVUE-300) INJECTION 61% IV. Dilute oral contrast. COMPARISON:  CT abdomen and pelvis 12/07/2016, pelvic ultrasound 12/24/2016 FINDINGS: Lower chest: Lung bases clear Hepatobiliary: Gallbladder and liver normal appearance Pancreas: Normal appearance Spleen: Normal appearance Adrenals/Urinary Tract: Adrenal glands, kidneys, ureters, and bladder normal appearance Stomach/Bowel: Normal appendix. Stomach and bowel loops normal appearance Vascular/Lymphatic: Single small LEFT pelvic phlebolith. Aorta normal caliber. No abdominal or pelvic adenopathy. Reproductive: Normal appearing uterus and ovaries. Other: Small amount of low to intermediate attenuation free pelvic fluid. Musculoskeletal: No acute osseous abnormalities. IMPRESSION:  Small amount of low to intermediate attenuation free pelvic fluid, nonspecific. Otherwise negative exam. Electronically Signed   By: Ulyses Southward M.D.   On: 12/24/2016 14:17   [place] Art/ven Flow Abd Pelv Doppler  Result Date: 12/24/2016 CLINICAL DATA:  Lower abdominal pain. EXAM: TRANSABDOMINAL AND TRANSVAGINAL ULTRASOUND OF PELVIS DOPPLER ULTRASOUND OF OVARIES TECHNIQUE: Both transabdominal and transvaginal ultrasound examinations of the pelvis were performed. Transabdominal technique was performed for global imaging of the pelvis including uterus, ovaries, adnexal regions, and pelvic cul-de-sac. It was necessary to proceed with endovaginal exam following the transabdominal exam to visualize the endometrium and ovaries. Color and duplex Doppler ultrasound was utilized to evaluate blood flow to the ovaries. COMPARISON:  CT abdomen 12/07/2016 FINDINGS: Uterus Measurements: 8.1 x 3.9 x 5.5 cm. No fibroids or other mass visualized. Endometrium Thickness: 3 mm.  No focal abnormality visualized. Right ovary Measurements: 2.8 x 2.4 x 2.1 cm. Normal appearance/no adnexal mass. Left ovary Measurements: 4.2 x 3 x 2.7 cm. Normal appearance/no adnexal mass. Pulsed Doppler evaluation of both ovaries demonstrates normal low-resistance arterial and venous waveforms. Other findings Trace pelvic free fluid. IMPRESSION: 1. No ovarian torsion. 2. No focal pelvic abnormality. Electronically Signed   By: Elige Ko   On: 12/24/2016 11:20    ____________________________________________   PROCEDURES  Procedure(s) performed: None  Procedures  Critical Care performed: No  ____________________________________________   INITIAL IMPRESSION / ASSESSMENT AND PLAN / ED COURSE  Pertinent labs & imaging results that were available during my care of the patient were reviewed by me and considered in my medical decision making (see chart for details).  Differential diagnosis includes but is not limited to, abdominal  perforation, torsed ovary or ruptured cyst, cholecystitis, appendicitis, diverticulitis, colitis, esophagitis/gastritis, kidney stone, pyelonephritis, urinary tract infection, aortic aneurysm. All are considered in decision and treatment plan. Based upon the patient's presentation and risk factors, given the patient's recent diagnosis of cysts or proceed with transvaginal option to evaluate for cyst rupture or torsion. Clinical history she gives is very suspicious for a ruptured ovarian cyst.  After reviewing imaging and labs, constellation of findings seems highly suggestive of a probable ruptured ovarian cyst    Patient reports that she does not have any prescription pain medications at home, that she utilized the brief prescription she had a couple weeks ago. Denies any history of substance abuse. He does have a prescription for Xanax, but reports she only takes very rarely and is agreeable to not using while prescribed hydrocodone.  I will prescribe the patient a narcotic pain medicine due to their condition which I anticipate will cause at least moderate pain short term. I discussed with the patient safe use of narcotic pain  medicines, and that they are not to drive, work in dangerous areas, or ever take more than prescribed (no more than 1 pill every 6 hours). We discussed that this is the type of medication that can be  overdosed on and the risks of this type of medicine. Patient is very agreeable to only use as prescribed and to never use more than prescribed.  ----------------------------------------- 3:12 PM on 12/24/2016 -----------------------------------------  Patient reports mild to moderate achy discomfort, but overall much improved. She has follow-up scheduled for the end of the month with her gynecologist, but I advised her to call on Monday and attempt to schedule an earlier appointment. She is fully awake and alert, her friend is with her and will be driving her home. Understands not  to drive this day.  Return precautions and treatment recommendations and follow-up discussed with the patient who is agreeable with the plan.  ____________________________________________   FINAL CLINICAL IMPRESSION(S) / ED DIAGNOSES  Final diagnoses:  Lower abdominal pain  Ruptured ovarian cyst      NEW MEDICATIONS STARTED DURING THIS VISIT:  New Prescriptions   HYDROCODONE-ACETAMINOPHEN (NORCO/VICODIN) 5-325 MG TABLET    Take 1 tablet by mouth every 6 (six) hours as needed for moderate pain.     Note:  This document was prepared using Dragon voice recognition software and may include unintentional dictation errors.     Sharyn Creamer, MD 12/24/16 [phone]

## 2016-12-24 NOTE — ED Notes (Signed)
Transported to CT 

## 2016-12-24 NOTE — ED Notes (Signed)
Bilatereal abd pain near ovaries. States had the pain a month ago. Pain increased over the past 2 days. States she has a large cyst on one side. Pain radiates to her back.  Reports abd pain when urinating. Pt currently on period for the past 2-3 days which she states is lighter normal

## 2016-12-24 NOTE — ED Notes (Signed)
Answered call bell. Pt stated IV was burning. Arm was warm and dry with no puffiness around the IV. IV was running good. Pt informed that all appeared to be fine, but I would let RN know. Pt was fine with everything. Pt was texting on cell phone.

## 2016-12-24 NOTE — ED Notes (Signed)
E signature pad not working 

## 2016-12-24 NOTE — ED Notes (Signed)
Returned from U/S

## 2016-12-24 NOTE — ED Notes (Signed)
AAOx3.  Skin warm and dry.  NAD.  Posture relaxed and upright. 

## 2016-12-24 NOTE — ED Notes (Signed)
Dr. Fanny Bien notified patient continues to c/o pain and is requesting something to drink. No new orders at this time.

## 2016-12-24 NOTE — ED Notes (Signed)
To US

## 2016-12-24 NOTE — ED Triage Notes (Signed)
Patient with complaint of lower abdominal pain that started 2 days ago. Patient was seen here about 2 weeks ago for similar pain and diagnosed with ovarian cyst. Patient states that the pain has become worse and that she has been unable to follow up with GYN. Patient states that she has had some pain with urination and nausea.

## 2016-12-24 NOTE — ED Notes (Signed)
Notified Tori in lab to add on urine culture

## 2016-12-25 LAB — URINE CULTURE: SPECIAL REQUESTS: NORMAL

## 2016-12-26 ENCOUNTER — Ambulatory Visit: Payer: Medicaid Other | Admitting: Gastroenterology

## 2016-12-26 ENCOUNTER — Other Ambulatory Visit: Payer: Self-pay

## 2017-01-23 ENCOUNTER — Other Ambulatory Visit: Payer: Self-pay

## 2017-01-23 ENCOUNTER — Emergency Department
Admission: EM | Admit: 2017-01-23 | Discharge: 2017-01-23 | Disposition: A | Discharge status: Other Acute Inpatient Hospital | Payer: Medicaid Other | Attending: Emergency Medicine | Admitting: Emergency Medicine

## 2017-01-23 ENCOUNTER — Inpatient Hospital Stay
Admission: EM | Admit: 2017-01-23 | Discharge: 2017-01-26 | DRG: 885 | Disposition: A | Discharge status: Home / Self Care | Payer: Medicaid Other | Source: Intra-hospital | Attending: Psychiatry | Admitting: Psychiatry

## 2017-01-23 DIAGNOSIS — Z811 Family history of alcohol abuse and dependence: Secondary | ICD-10-CM

## 2017-01-23 DIAGNOSIS — R45851 Suicidal ideations: Secondary | ICD-10-CM | POA: Diagnosis present

## 2017-01-23 DIAGNOSIS — F17210 Nicotine dependence, cigarettes, uncomplicated: Secondary | ICD-10-CM | POA: Insufficient documentation

## 2017-01-23 DIAGNOSIS — F331 Major depressive disorder, recurrent, moderate: Secondary | ICD-10-CM | POA: Insufficient documentation

## 2017-01-23 DIAGNOSIS — F13239 Sedative, hypnotic or anxiolytic dependence with withdrawal, unspecified: Secondary | ICD-10-CM | POA: Diagnosis present

## 2017-01-23 DIAGNOSIS — Z888 Allergy status to other drugs, medicaments and biological substances status: Secondary | ICD-10-CM | POA: Diagnosis not present

## 2017-01-23 DIAGNOSIS — F332 Major depressive disorder, recurrent severe without psychotic features: Principal | ICD-10-CM | POA: Diagnosis present

## 2017-01-23 DIAGNOSIS — F1220 Cannabis dependence, uncomplicated: Secondary | ICD-10-CM | POA: Diagnosis present

## 2017-01-23 DIAGNOSIS — K219 Gastro-esophageal reflux disease without esophagitis: Secondary | ICD-10-CM | POA: Diagnosis present

## 2017-01-23 DIAGNOSIS — R531 Weakness: Secondary | ICD-10-CM | POA: Diagnosis not present

## 2017-01-23 DIAGNOSIS — F1123 Opioid dependence with withdrawal: Secondary | ICD-10-CM | POA: Diagnosis present

## 2017-01-23 DIAGNOSIS — Z79899 Other long term (current) drug therapy: Secondary | ICD-10-CM | POA: Diagnosis not present

## 2017-01-23 DIAGNOSIS — Z635 Disruption of family by separation and divorce: Secondary | ICD-10-CM

## 2017-01-23 DIAGNOSIS — M549 Dorsalgia, unspecified: Secondary | ICD-10-CM | POA: Diagnosis not present

## 2017-01-23 DIAGNOSIS — G4710 Hypersomnia, unspecified: Secondary | ICD-10-CM | POA: Diagnosis present

## 2017-01-23 DIAGNOSIS — F410 Panic disorder [episodic paroxysmal anxiety] without agoraphobia: Secondary | ICD-10-CM | POA: Diagnosis present

## 2017-01-23 DIAGNOSIS — Z791 Long term (current) use of non-steroidal anti-inflammatories (NSAID): Secondary | ICD-10-CM

## 2017-01-23 DIAGNOSIS — Z818 Family history of other mental and behavioral disorders: Secondary | ICD-10-CM

## 2017-01-23 DIAGNOSIS — F1120 Opioid dependence, uncomplicated: Secondary | ICD-10-CM | POA: Diagnosis present

## 2017-01-23 DIAGNOSIS — R51 Headache: Secondary | ICD-10-CM | POA: Diagnosis not present

## 2017-01-23 DIAGNOSIS — Z8249 Family history of ischemic heart disease and other diseases of the circulatory system: Secondary | ICD-10-CM

## 2017-01-23 DIAGNOSIS — Z79891 Long term (current) use of opiate analgesic: Secondary | ICD-10-CM | POA: Diagnosis not present

## 2017-01-23 DIAGNOSIS — R42 Dizziness and giddiness: Secondary | ICD-10-CM | POA: Diagnosis not present

## 2017-01-23 DIAGNOSIS — R110 Nausea: Secondary | ICD-10-CM | POA: Diagnosis present

## 2017-01-23 DIAGNOSIS — F17200 Nicotine dependence, unspecified, uncomplicated: Secondary | ICD-10-CM | POA: Diagnosis present

## 2017-01-23 DIAGNOSIS — Z5181 Encounter for therapeutic drug level monitoring: Secondary | ICD-10-CM | POA: Diagnosis not present

## 2017-01-23 DIAGNOSIS — Z91048 Other nonmedicinal substance allergy status: Secondary | ICD-10-CM

## 2017-01-23 DIAGNOSIS — R252 Cramp and spasm: Secondary | ICD-10-CM | POA: Diagnosis not present

## 2017-01-23 DIAGNOSIS — M79606 Pain in leg, unspecified: Secondary | ICD-10-CM | POA: Diagnosis not present

## 2017-01-23 DIAGNOSIS — Z598 Other problems related to housing and economic circumstances: Secondary | ICD-10-CM

## 2017-01-23 DIAGNOSIS — Z046 Encounter for general psychiatric examination, requested by authority: Secondary | ICD-10-CM | POA: Diagnosis present

## 2017-01-23 DIAGNOSIS — G4700 Insomnia, unspecified: Secondary | ICD-10-CM | POA: Diagnosis not present

## 2017-01-23 DIAGNOSIS — R197 Diarrhea, unspecified: Secondary | ICD-10-CM | POA: Diagnosis not present

## 2017-01-23 LAB — COMPREHENSIVE METABOLIC PANEL
ALK PHOS: 79 U/L (ref 38–126)
ALT: 12 U/L — ABNORMAL LOW (ref 14–54)
ANION GAP: 7 (ref 5–15)
AST: 25 U/L (ref 15–41)
Albumin: 4.3 g/dL (ref 3.5–5.0)
BILIRUBIN TOTAL: 0.4 mg/dL (ref 0.3–1.2)
BUN: 21 mg/dL — AB (ref 6–20)
CALCIUM: 9.2 mg/dL (ref 8.9–10.3)
CO2: 27 mmol/L (ref 22–32)
Chloride: 104 mmol/L (ref 101–111)
Creatinine, Ser: 0.5 mg/dL (ref 0.44–1.00)
GFR calc Af Amer: >60 mL/min (ref >60)
GFR calc non Af Amer: >60 mL/min (ref >60)
GLUCOSE: 94 mg/dL (ref 65–99)
POTASSIUM: 2.9 mmol/L — AB (ref 3.5–5.1)
Sodium: 138 mmol/L (ref 135–145)
Total Protein: 7.6 g/dL (ref 6.5–8.1)

## 2017-01-23 LAB — CBC WITH DIFFERENTIAL/PLATELET
Basophils Absolute: 0.1 10*3/uL (ref 0–0.1)
Basophils Relative: 1 %
Eosinophils Absolute: 0.1 10*3/uL (ref 0–0.7)
Eosinophils Relative: 2 %
HEMATOCRIT: 41.5 % (ref 35.0–47.0)
HEMOGLOBIN: 13.8 g/dL (ref 12.0–16.0)
LYMPHS ABS: 2.8 10*3/uL (ref 1.0–3.6)
Lymphocytes Relative: 45 %
MCH: 27.9 pg (ref 26.0–34.0)
MCHC: 33.2 g/dL (ref 32.0–36.0)
MCV: 84.1 fL (ref 80.0–100.0)
MONO ABS: 0.5 10*3/uL (ref 0.2–0.9)
MONOS PCT: 7 %
NEUTROS ABS: 2.8 10*3/uL (ref 1.4–6.5)
NEUTROS PCT: 45 %
Platelets: 207 10*3/uL (ref 150–440)
RBC: 4.94 MIL/uL (ref 3.80–5.20)
RDW: 13.3 % (ref 11.5–14.5)
WBC: 6.3 10*3/uL (ref 3.6–11.0)

## 2017-01-23 LAB — URINE DRUG SCREEN, QUALITATIVE (ARMC ONLY)
Amphetamines, Ur Screen: POSITIVE — AB
BARBITURATES, UR SCREEN: NOT DETECTED
Benzodiazepine, Ur Scrn: POSITIVE — AB
CANNABINOID 50 NG, UR ~~LOC~~: POSITIVE — AB
COCAINE METABOLITE, UR ~~LOC~~: NOT DETECTED
MDMA (Ecstasy)Ur Screen: NOT DETECTED
Methadone Scn, Ur: NOT DETECTED
OPIATE, UR SCREEN: NOT DETECTED
PHENCYCLIDINE (PCP) UR S: NOT DETECTED
Tricyclic, Ur Screen: NOT DETECTED

## 2017-01-23 LAB — ETHANOL: Alcohol, Ethyl (B): <5 mg/dL (ref <5)

## 2017-01-23 MED ORDER — TRAZODONE HCL 100 MG PO TABS
100.0000 mg | ORAL_TABLET | Freq: Every evening | ORAL | Status: DC | PRN
  Administered 2017-01-23 – 2017-01-24 (×2): 100 mg via ORAL
  Filled 2017-01-23 (×2): qty 1

## 2017-01-23 MED ORDER — LISDEXAMFETAMINE DIMESYLATE 50 MG PO CAPS: 50.0000 mg | ORAL_CAPSULE | Freq: Every day | ORAL | Status: DC

## 2017-01-23 MED ORDER — ACETAMINOPHEN 325 MG PO TABS
650.0000 mg | ORAL_TABLET | Freq: Four times a day (QID) | ORAL | Status: DC | PRN
  Administered 2017-01-24: 650 mg via ORAL
  Filled 2017-01-23: qty 2

## 2017-01-23 MED ORDER — POTASSIUM CHLORIDE CRYS ER 20 MEQ PO TBCR
40.0000 meq | EXTENDED_RELEASE_TABLET | Freq: Once | ORAL | Status: AC
  Administered 2017-01-23: 40 meq via ORAL
  Filled 2017-01-23: qty 2

## 2017-01-23 MED ORDER — LORAZEPAM 1 MG PO TABS
1.0000 mg | ORAL_TABLET | Freq: Once | ORAL | Status: AC
  Administered 2017-01-23: 1 mg via ORAL
  Filled 2017-01-23: qty 1

## 2017-01-23 MED ORDER — LISDEXAMFETAMINE DIMESYLATE 30 MG PO CAPS
30.0000 mg | ORAL_CAPSULE | Freq: Every day | ORAL | Status: DC
  Administered 2017-01-23: 30 mg via ORAL
  Filled 2017-01-23: qty 1

## 2017-01-23 MED ORDER — DICYCLOMINE HCL 10 MG PO CAPS
10.0000 mg | ORAL_CAPSULE | Freq: Three times a day (TID) | ORAL | Status: DC
  Administered 2017-01-23: 10 mg via ORAL
  Filled 2017-01-23: qty 1

## 2017-01-23 MED ORDER — PANTOPRAZOLE SODIUM 40 MG PO TBEC
40.0000 mg | DELAYED_RELEASE_TABLET | Freq: Every day | ORAL | Status: DC
  Administered 2017-01-23: 40 mg via ORAL
  Filled 2017-01-23: qty 1

## 2017-01-23 MED ORDER — PROMETHAZINE HCL 25 MG PO TABS
25.0000 mg | ORAL_TABLET | Freq: Four times a day (QID) | ORAL | Status: DC | PRN
Start: 2017-01-23 — End: 2017-01-23

## 2017-01-23 MED ORDER — ACETAMINOPHEN 325 MG PO TABS
ORAL_TABLET | ORAL | Status: AC
  Administered 2017-01-23: 650 mg via ORAL
  Filled 2017-01-23: qty 2

## 2017-01-23 MED ORDER — LISDEXAMFETAMINE DIMESYLATE 30 MG PO CAPS
50.0000 mg | ORAL_CAPSULE | Freq: Every day | ORAL | Status: DC
  Filled 2017-01-23: qty 1

## 2017-01-23 MED ORDER — SERTRALINE HCL 50 MG PO TABS
50.0000 mg | ORAL_TABLET | Freq: Every day | ORAL | Status: DC
  Administered 2017-01-23: 50 mg via ORAL
  Filled 2017-01-23: qty 1

## 2017-01-23 MED ORDER — LISDEXAMFETAMINE DIMESYLATE 20 MG PO CAPS
20.0000 mg | ORAL_CAPSULE | Freq: Every day | ORAL | Status: DC
  Administered 2017-01-23: 20 mg via ORAL
  Filled 2017-01-23: qty 1

## 2017-01-23 MED ORDER — DICYCLOMINE HCL 10 MG PO CAPS
10.0000 mg | ORAL_CAPSULE | Freq: Three times a day (TID) | ORAL | Status: DC
  Administered 2017-01-23 – 2017-01-24 (×2): 10 mg via ORAL
  Filled 2017-01-23 (×5): qty 1

## 2017-01-23 MED ORDER — LISDEXAMFETAMINE DIMESYLATE 20 MG PO CAPS
50.0000 mg | ORAL_CAPSULE | Freq: Every day | ORAL | Status: DC
  Filled 2017-01-23: qty 1

## 2017-01-23 MED ORDER — LISDEXAMFETAMINE DIMESYLATE 20 MG PO CAPS
20.0000 mg | ORAL_CAPSULE | Freq: Every day | ORAL | Status: DC
  Administered 2017-01-24: 20 mg via ORAL
  Filled 2017-01-23: qty 1

## 2017-01-23 MED ORDER — PANTOPRAZOLE SODIUM 40 MG PO TBEC
40.0000 mg | DELAYED_RELEASE_TABLET | Freq: Every day | ORAL | Status: DC
  Administered 2017-01-24 – 2017-01-26 (×3): 40 mg via ORAL
  Filled 2017-01-23 (×3): qty 1

## 2017-01-23 MED ORDER — ALPRAZOLAM 0.5 MG PO TABS
1.0000 mg | ORAL_TABLET | Freq: Two times a day (BID) | ORAL | Status: DC
  Administered 2017-01-23: 1 mg via ORAL
  Filled 2017-01-23: qty 2

## 2017-01-23 MED ORDER — LISDEXAMFETAMINE DIMESYLATE 30 MG PO CAPS
30.0000 mg | ORAL_CAPSULE | Freq: Every day | ORAL | Status: DC
  Administered 2017-01-24: 30 mg via ORAL
  Filled 2017-01-23: qty 1

## 2017-01-23 MED ORDER — PROMETHAZINE HCL 25 MG PO TABS
25.0000 mg | ORAL_TABLET | Freq: Four times a day (QID) | ORAL | Status: DC | PRN
  Administered 2017-01-23: 25 mg via ORAL
  Filled 2017-01-23: qty 1

## 2017-01-23 MED ORDER — ACETAMINOPHEN 325 MG PO TABS
650.0000 mg | ORAL_TABLET | Freq: Once | ORAL | Status: AC
  Administered 2017-01-23: 650 mg via ORAL
  Filled 2017-01-23: qty 2

## 2017-01-23 MED ORDER — MAGNESIUM HYDROXIDE 400 MG/5ML PO SUSP: 30.0000 mL | Freq: Every day | ORAL | Status: DC | PRN

## 2017-01-23 MED ORDER — ALPRAZOLAM 1 MG PO TABS
1.0000 mg | ORAL_TABLET | Freq: Two times a day (BID) | ORAL | Status: DC
  Administered 2017-01-23 – 2017-01-24 (×2): 1 mg via ORAL
  Filled 2017-01-23 (×2): qty 1

## 2017-01-23 MED ORDER — ACETAMINOPHEN 325 MG PO TABS
650.0000 mg | ORAL_TABLET | Freq: Once | ORAL | Status: AC
  Administered 2017-01-23: 650 mg via ORAL

## 2017-01-23 MED ORDER — SERTRALINE HCL 25 MG PO TABS
50.0000 mg | ORAL_TABLET | Freq: Every day | ORAL | Status: DC
  Administered 2017-01-24 – 2017-01-26 (×3): 50 mg via ORAL
  Filled 2017-01-23 (×3): qty 2

## 2017-01-23 MED ORDER — ALUM & MAG HYDROXIDE-SIMETH 200-200-20 MG/5ML PO SUSP: 30.0000 mL | ORAL | Status: DC | PRN

## 2017-01-23 NOTE — Progress Notes (Signed)
Admission Note: Patient is an 31 year old female who is admitted to the unit for symptoms of anxiety and depression.  Patient is alert and oriented x 4.  Patient presents with sad, flat affect and depressed mood.  Patient was tearful throughout assessment.  Patient currently denies suicidal thoughts and hallucinations.  Patient states "I'm here to get help with my depression."  She reports the desire to live a normal happy life by being on the right medication.  Admission plan of care reviewed with patient.  Consent for treatment signed.  Skin assessment completed.  Skin is dry and intact with multiple tattoos.  Personal belonging searched.  No contraband found.  Patient oriented to the unit, staff and room.  Patient offered support and encouragement as needed.  Routine safety checks initiated.  Patient is safe on the unit.

## 2017-01-23 NOTE — Consult Note (Signed)
[place] [facility]ry Consult   Reason for Consult:  Consult for 31 year old woman with history of major depression who comes voluntarily because of worsening symptoms Referring Physician:  Mariea Clonts Patient Identification: RAIGEN JAGIELSKI MRN:  [id number] Principal Diagnosis: Severe recurrent major depression without psychotic features [facility]) Diagnosis:   Patient Active Problem List   Diagnosis Date Noted  . Severe recurrent major depression without psychotic features ([place]) [F33.2] 01/23/2017  . ADHD [F90.9] 01/23/2017  . Social anxiety disorder [F40.10] 01/23/2017  . Abnormal findings-gastrointestinal tract [R19.8]   . ADD (attention deficit disorder) [F98.8] 06/07/2016  . Left ovarian cyst [N83.202] 03/07/2016  . Chronic colitis [K52.9] 02/28/2016  . Anxiety [F41.9] 02/28/2016  . GERD (gastroesophageal reflux disease) [K21.9] 02/28/2016  . Migraine [G43.909] 02/28/2016    Total Time spent with patient: 1 hour  Subjective:   Vicki Dominguez is a 31 y.o. female patient admitted with "my depression has been worse".  HPI:  Patient interviewed. Chart reviewed. This is a 31 year old woman who presented voluntarily to the emergency room stating her depression has been worse. She feels down and sad and negative all the time. Also feels very anxious. She says that for the past month her activity level has been very diminished. She never feels like getting out of bed doesn't feel like doing anything. Has had suicidal thoughts. She is not reporting any psychotic thoughts. She is not currently on any medication for depression. She sees a psychiatrist but is only on Xanax prescribed right now. Doesn't feel like she necessarily has new stress but finds her chronic life situation with her young children and the man she lives with and her family to be stressful. Not reporting homicidal ideation. Has some chronic medical problems with chronic migraines and colitis and TMJ. He is currently taking  medication not only for anxiety but for ADHD despite which her energy level is 0.  Medical history: She reports having a history of colitis in frequent migraines temporomandibular joint pain and ovarian cysts all causing some degree of chronic intermittent discomfort.  Substance abuse history: Says she drinks alcohol only "occasionally" although she admits that she's had a couple of drinks this past week. Doesn't think her alcohol is an issue. Does not present as intoxicated. Denies any other drug use.  Social history: Patient has a 32 year old and 85-year-old child living with her. Also lives with the father of the children although she pointedly does not describe him either as a husband or boyfriend. Says their relationship is only "okay". She says that a lot of extended members of her family give her a hard time. She is employed and has been managing to do some work recently.  Past Psychiatric History: She reports no previous psychiatric hospitalization. No history of suicide attempts or violence. No history of mania or psychosis. Multiple antidepressants have been tried in the past including sertraline, Cymbalta, Wellbutrin, Prozac, Trintillix and Paxil. The only one she remembers is being helpful was sertraline.  Risk to Self: Suicidal Ideation: No Suicidal Intent: No Is patient at risk for suicide?: No Suicidal Plan?: No Access to Means: No What has been your use of drugs/alcohol within the last 12 months?: occassional use of alcohol How many times?: 0 Other Self Harm Risks: denied Triggers for Past Attempts: None known Intentional Self Injurious Behavior: None Risk to Others: Homicidal Ideation: No Thoughts of Harm to Others: No Current Homicidal Intent: No Current Homicidal Plan: No Access to Homicidal Means: No Identified Victim: None identified History of harm  to others?: No Assessment of Violence: None Noted Violent Behavior Description: Denied by patient Does patient have  access to weapons?: No Criminal Charges Pending?: No Does patient have a court date: No Prior Inpatient Therapy: Prior Inpatient Therapy: No Prior Therapy Dates: N/a Prior Therapy Facilty/Provider(s): N/a Reason for Treatment: N/a Prior Outpatient Therapy: Prior Outpatient Therapy: Yes Prior Therapy Dates: Current Prior Therapy Facilty/Provider(s): Dr. Kasandra Knudsen Reason for Treatment: Depression Does patient have an ACCT team?: No Does patient have Intensive In-House Services?  : No Does patient have [facility] services? : No Does patient have P4CC services?: No  Past Medical History:  Past Medical History:  Diagnosis Date  . Anxiety   . Arthritis    hands, legs  . Colitis   . Depression   . Family history of adverse reaction to anesthesia    Mother difficult to awaken after Succinycholine  . GERD (gastroesophageal reflux disease)   . Migraine    Rare.  Tension HA 3x/wk  . Ovarian cyst   . TMJ arthritis   . TMJ syndrome     Past Surgical History:  Procedure Laterality Date  . COLONOSCOPY WITH PROPOFOL N/A 07/03/2016   Procedure: COLONOSCOPY WITH PROPOFOL;  Surgeon: Lucilla Lame, MD;  Location: [facility];  Service: Endoscopy;  Laterality: N/A;  . ESOPHAGOGASTRODUODENOSCOPY N/A 03/06/2016   Procedure: ESOPHAGOGASTRODUODENOSCOPY (EGD);  Surgeon: Manya Silvas, MD;  Location[facility] ENDOSCOPY;  Service: Endoscopy;  Laterality: N/A;  . LAPAROSCOPIC OVARIAN CYSTECTOMY Left 03/07/2016   Procedure: LAPAROSCOPIC OVARIAN CYSTECTOMY;  Surgeon: Will Bonnet, MD;  Location: [facility] ORS;  Service: Gynecology;  Laterality: Left;  . OVARIAN CYST REMOVAL    . TEMPOROMANDIBULAR JOINT SURGERY     Family History:  Family History  Problem Relation Age of Onset  . Heart attack Mother   . Hypertension Father    Family Psychiatric  History: Patient reports extensive depression and anxiety on both sides of her family although no history of suicidality Social History:  History  Alcohol Use  No    Comment: occasionally     History  Drug Use No    Social History   Social History  . Marital status: Single    Spouse name: N/A  . Number of children: N/A  . Years of education: N/A   Social History Main Topics  . Smoking status: Current Every Day Smoker    Packs/day: 0.50    Years: 10.00    Types: Cigarettes  . Smokeless tobacco: Never Used  . Alcohol use No     Comment: occasionally  . Drug use: No  . Sexual activity: Not on file   Other Topics Concern  . Not on file   Social History Narrative  . No narrative on file   Additional Social History:    Allergies:   Allergies  Allergen Reactions  . 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 Tape Rash    Adhesive on nicotene patches     Labs:  Results for orders placed or performed during the hospital encounter of 01/23/17 (from the past 48 hour(s))  Comprehensive metabolic panel     Status: Abnormal   Collection Time: 01/23/17  4:07 AM  Result Value Ref Range   Sodium 138 135 - 145 mmol/L   Potassium 2.9 (L) 3.5 - 5.1 mmol/L   Chloride 104 101 - 111 mmol/L   CO2 27 22 - 32  mmol/L   Glucose, Bld 94 65 - 99 mg/dL   BUN 21 (H) 6 - 20 mg/dL   Creatinine, Ser 0.50 0.44 - 1.00 mg/dL   Calcium 9.2 8.9 - 10.3 mg/dL   Total Protein 7.6 6.5 - 8.1 g/dL   Albumin 4.3 3.5 - 5.0 g/dL   AST 25 15 - 41 U/L   ALT 12 (L) 14 - 54 U/L   Alkaline Phosphatase 79 38 - 126 U/L   Total Bilirubin 0.4 0.3 - 1.2 mg/dL   GFR calc non Af Amer >60 >60 mL/min   GFR calc Af Amer >60 >60 mL/min    Comment: (NOTE) The eGFR has been calculated using the CKD EPI equation. This calculation has not been validated in all clinical situations. eGFR's persistently <60 mL/min signify possible Chronic Kidney Disease.    Anion gap 7 5 - 15  Ethanol     Status: None   Collection Time: 01/23/17  4:07 AM  Result Value Ref Range    Alcohol, Ethyl (B) <5 <5 mg/dL    Comment:        LOWEST DETECTABLE LIMIT FOR SERUM ALCOHOL IS 5 mg/dL FOR MEDICAL PURPOSES ONLY   CBC with Diff     Status: None   Collection Time: 01/23/17  4:07 AM  Result Value Ref Range   WBC 6.3 3.6 - 11.0 K/uL   RBC 4.94 3.80 - 5.20 MIL/uL   Hemoglobin 13.8 12.0 - 16.0 g/dL   HCT 41.5 35.0 - 47.0 %   MCV 84.1 80.0 - 100.0 fL   MCH 27.9 26.0 - 34.0 pg   MCHC 33.2 32.0 - 36.0 g/dL   RDW 13.3 11.5 - 14.5 %   Platelets 207 150 - 440 K/uL   Neutrophils Relative % 45 %   Neutro Abs 2.8 1.4 - 6.5 K/uL   Lymphocytes Relative 45 %   Lymphs Abs 2.8 1.0 - 3.6 K/uL   Monocytes Relative 7 %   Monocytes Absolute 0.5 0.2 - 0.9 K/uL   Eosinophils Relative 2 %   Eosinophils Absolute 0.1 0 - 0.7 K/uL   Basophils Relative 1 %   Basophils Absolute 0.1 0 - 0.1 K/uL    Current Facility-Administered Medications  Medication Dose Route Frequency Provider Last Rate Last Dose  . ALPRAZolam (XANAX) tablet 1 mg  1 mg Oral BID Gonzella Lex, MD      . dicyclomine (BENTYL) capsule 10 mg  10 mg Oral TID AC Malala Trenkamp T Adom Schoeneck, MD      . lisdexamfetamine (VYVANSE) capsule 50 mg  50 mg Oral Daily Seriyah Collison T Makiah Foye, MD      . pantoprazole (PROTONIX) EC tablet 40 mg  40 mg Oral Daily Gonzella Lex, MD      . promethazine (PHENERGAN) tablet 25 mg  25 mg Oral Q6H PRN Gonzella Lex, MD      . sertraline (ZOLOFT) tablet 50 mg  50 mg Oral Daily Gonzella Lex, MD       Current Outpatient Prescriptions  Medication Sig Dispense Refill  . ALPRAZolam (XANAX) 1 MG tablet Take 1 mg by mouth 2 (two) times daily.   4  . ascorbic acid (VITAMIN C) 500 MG tablet Take 500 mg by mouth daily.    . benzonatate (TESSALON) 200 MG capsule Take 1 capsule (200 mg total) by mouth 3 (three) times daily as needed. 30 capsule 0  . celecoxib (CELEBREX) 200 MG capsule Take 1 capsule (200 mg total) by mouth  2 (two) times daily as needed for moderate pain. Take with food 30 capsule 0  . cholecalciferol  (VITAMIN D) 1000 units tablet Take 1,000 Units by mouth daily.    Marland Kitchen dexlansoprazole (DEXILANT) 60 MG capsule Take 1 capsule (60 mg total) by mouth daily. 30 capsule 11  . dicyclomine (BENTYL) 10 MG capsule TAKE 2 CAPSULES (20 MG TOTAL) BY MOUTH EVERY 6 (SIX) HOURS. 240 capsule 0  . fexofenadine-pseudoephedrine (ALLEGRA-D ALLERGY & CONGESTION) 180-240 MG 24 hr tablet Take 1 tablet by mouth daily. (Patient not taking: Reported on 12/07/2016) 30 tablet 0  . HYDROcodone-acetaminophen (NORCO/VICODIN) 5-325 MG tablet Take 1 tablet by mouth every 6 (six) hours as needed for moderate pain. 10 tablet 0  . ibuprofen (ADVIL,MOTRIN) 800 MG tablet Take 800 mg by mouth every 8 (eight) hours as needed.    Marland Kitchen lisdexamfetamine (VYVANSE) 40 MG capsule TAKE 1 CAPSULE BY MOUTH EVERY MORNING    . Multiple Vitamin (MULTI-VITAMINS) TABS Take by mouth.    . ondansetron (ZOFRAN ODT) 4 MG disintegrating tablet Take 1 tablet (4 mg total) by mouth every 6 (six) hours as needed for nausea or vomiting. 20 tablet 0  . pantoprazole (PROTONIX) 40 MG tablet Take 1 tablet (40 mg total) by mouth 2 (two) times daily. 60 tablet 0  . PARoxetine (PAXIL-CR) 25 MG 24 hr tablet Take by mouth.    . promethazine (PHENERGAN) 25 MG tablet Take 25 mg by mouth every 6 (six) hours as needed. for nausea  4  . sucralfate (CARAFATE) 1 GM/10ML suspension Take 10 mLs (1 g total) by mouth 4 (four) times daily -  with meals and at bedtime. 420 mL 3  . SUMAtriptan (IMITREX) 50 MG tablet Take 50 mg by mouth.    . TRINTELLIX 10 MG TABS TAKE 1 TABLET EVERY DAY (PRIOR AUTH PENDING)  4    Musculoskeletal: Strength & Muscle Tone: within normal limits Gait & Station: normal Patient leans: N/A  Psychiatric Specialty Exam: Physical Exam  Nursing note and vitals reviewed. Constitutional: She appears well-developed and well-nourished.  HENT:  Head: Normocephalic and atraumatic.  Eyes: Conjunctivae are normal. Pupils are equal, round, and reactive to light.   Neck: Normal range of motion.  Cardiovascular: Regular rhythm and normal heart sounds.   Respiratory: Effort normal. No respiratory distress.  GI: Soft.  Musculoskeletal: Normal range of motion.  Neurological: She is alert.  Skin: Skin is warm and dry.  Psychiatric: Her mood appears anxious. Her speech is delayed. She is slowed and withdrawn. She expresses impulsivity. She exhibits a depressed mood. She expresses suicidal ideation. She expresses no suicidal plans. She exhibits abnormal recent memory.    Review of Systems  Constitutional: Negative.   HENT: Negative.   Eyes: Negative.   Respiratory: Negative.   Cardiovascular: Negative.   Gastrointestinal: Negative.   Musculoskeletal: Negative.   Skin: Negative.   Neurological: Negative.   Psychiatric/Behavioral: Positive for depression and suicidal ideas. Negative for hallucinations, memory loss and substance abuse. The patient is nervous/anxious and has insomnia.     Blood pressure (!) 126/91, pulse (!) 115, temperature 98.6 F (37 C), temperature source Oral, resp. rate 18, height '5\' 2"'  (1.575 m), weight 135 lb (61.2 kg), last menstrual period 12/22/2016, SpO2 100 %.Body mass index is 24.69 kg/m.  General Appearance: Casual  Eye Contact:  Fair  Speech:  Slow  Volume:  Decreased  Mood:  Depressed  Affect:  Congruent  Thought Process:  Goal Directed  Orientation:  Full (Time,  Place, and Person)  Thought Content:  Logical  Suicidal Thoughts:  Yes.  without intent/plan  Homicidal Thoughts:  No  Memory:  Immediate;   Good Recent;   Fair Remote;   Fair  Judgement:  Fair  Insight:  Fair  Psychomotor Activity:  Decreased  Concentration:  Concentration: Fair  Recall:  [employer] of Knowledge:  Fair  Language:  Fair  Akathisia:  No  Handed:  Right  AIMS (if indicated):     Assets:  Communication Skills Desire for Improvement Financial Resources/Insurance Housing Physical Health Resilience Social Support  ADL's:  Intact   Cognition:  WNL  Sleep:        Treatment Plan Summary: Daily contact with patient to assess and evaluate symptoms and progress in treatment, Medication management and Plan See note below with full plan note below with full plan  Disposition: Daily contact with patient to assess and evaluate symptoms and progress in treatment, Medication management and Plan 31 year old woman who reports symptoms of major depression and also history of generalized anxiety and social anxiety as well as a diagnosis of ADHD. Currently mood and depressive symptoms are getting much worse with a steep decline in functioning. Passive suicidal thoughts. Patient would benefit most from inpatient hospitalization for safety and stabilization. She agrees to the plan. Continue outpatient medicine and restart sertraline 50 mg a day. EKG and full set of labs to be ordered. Case reviewed with TTS and emergency room physician. Patient agreeable to the plan.  Alethia Berthold, MD 01/23/2017 11:56 AM

## 2017-01-23 NOTE — ED Notes (Signed)
CHECK PTS CHART FOR BELONGINGS THAT HAVE BEEN LOCKED UP

## 2017-01-23 NOTE — BH Assessment (Signed)
Patient is to be admitted to [facility] by Dr. Toni Amend.  Attending Physician will be Dr. Ardyth Harps.   Patient has been assigned to room 304, by [facility] Charge Nurse [place].   Intake Paper Work has been signed and placed on patient chart.  ER staff is aware of the admission Davy Pique, ER Sect.; Dr. Sharma Covert, ER MD; Joice Lofts, Patient's Nurse & Vikki Ports, Patient Access).

## 2017-01-23 NOTE — ED Notes (Signed)
Key 4 placed in ED pyxis.

## 2017-01-23 NOTE — ED Notes (Signed)
Pt given breakfast tray and informed UA is needed.

## 2017-01-23 NOTE — Tx Team (Signed)
Initial Treatment Plan 01/23/2017 5:17 PM Vicki Dominguez ZOX:[id number]    PATIENT STRESSORS: Financial difficulties Health problems Substance abuse   PATIENT STRENGTHS: Ability for insight Average or above average intelligence Capable of independent living Communication skills Motivation for treatment/growth   PATIENT IDENTIFIED PROBLEMS: "to be happy"  "to get depression under control"  Anxiety  Depression  Substance Abuse             DISCHARGE CRITERIA:  Ability to meet basic life and health needs Adequate post-discharge living arrangements Motivation to continue treatment in a less acute level of care Verbal commitment to aftercare and medication compliance  PRELIMINARY DISCHARGE PLAN: Attend aftercare/continuing care group Outpatient therapy Return to previous living arrangement  PATIENT/FAMILY INVOLVEMENT: This treatment plan has been presented to and reviewed with the patient, Vicki Dominguez, and/or family member.  The patient and family have been given the opportunity to ask questions and make suggestions.  Mickie Bail, RN 01/23/2017, 5:17 PM

## 2017-01-23 NOTE — ED Notes (Signed)
Key to patients belongings given to [facility] in behavioral unit

## 2017-01-23 NOTE — ED Provider Notes (Signed)
[facility] Emergency Department Provider Note  ____________________________________________  Time seen: Approximately 5:06 AM  I have reviewed the triage vital signs and the nursing notes.   HISTORY  Chief Complaint Psychiatric Evaluation    HPI Vicki Dominguez is a 31 y.o. female who complains of worsening depression over the past couple of months. She is to be on Paxil but stopped taking it a few months ago because she thought it wasn't helping. Reports anhedonia, excessive sleep, decreased appetite, depressed mood, poor concentration and forgetfulness. Denies suicidal ideation or homicidal ideation. Denies hallucinations.  Reports a recent death in her family with her uncle which has been a stressor for her.  She also has anxiety for which she takes 1 mg Xanax 2 times a day. She last took it yesterday morning. Reports that she does get withdrawal symptoms if she does not take it for a while.   Past Medical History:  Diagnosis Date  . Anxiety   . Arthritis    hands, legs  . Colitis   . Depression   . Family history of adverse reaction to anesthesia    Mother difficult to awaken after Succinycholine  . GERD (gastroesophageal reflux disease)   . Migraine    Rare.  Tension HA 3x/wk  . Ovarian cyst   . TMJ arthritis   . TMJ syndrome      Patient Active Problem List   Diagnosis Date Noted  . Abnormal findings-gastrointestinal tract   . ADD (attention deficit disorder) 06/07/2016  . Left ovarian cyst 03/07/2016  . Acute colitis 02/28/2016  . Anxiety 02/28/2016  . GERD (gastroesophageal reflux disease) 02/28/2016  . Migraine 02/28/2016     Past Surgical History:  Procedure Laterality Date  . COLONOSCOPY WITH PROPOFOL N/A 07/03/2016   Procedure: COLONOSCOPY WITH PROPOFOL;  Surgeon: Midge Minium, MD;  Location: [facility];  Service: Endoscopy;  Laterality: N/A;  . ESOPHAGOGASTRODUODENOSCOPY N/A 03/06/2016   Procedure:  ESOPHAGOGASTRODUODENOSCOPY (EGD);  Surgeon: Scot Jun, MD;  Location: [facility] ENDOSCOPY;  Service: Endoscopy;  Laterality: N/A;  . LAPAROSCOPIC OVARIAN CYSTECTOMY Left 03/07/2016   Procedure: LAPAROSCOPIC OVARIAN CYSTECTOMY;  Surgeon: Conard Novak, MD;  Location: [facility] ORS;  Service: Gynecology;  Laterality: Left;  . OVARIAN CYST REMOVAL    . TEMPOROMANDIBULAR JOINT SURGERY       Prior to Admission medications   Medication Sig Start Date End Date Taking? Authorizing Provider  ALPRAZolam Prudy Feeler) 1 MG tablet Take 1 mg by mouth 2 (two) times daily.  02/09/16   Historical Provider, MD  ascorbic acid (VITAMIN C) 500 MG tablet Take 500 mg by mouth daily.    Historical Provider, MD  benzonatate (TESSALON) 200 MG capsule Take 1 capsule (200 mg total) by mouth 3 (three) times daily as needed. 09/02/16   Hassan Rowan, MD  celecoxib (CELEBREX) 200 MG capsule Take 1 capsule (200 mg total) by mouth 2 (two) times daily as needed for moderate pain. Take with food 09/02/16   Hassan Rowan, MD  cholecalciferol (VITAMIN D) 1000 units tablet Take 1,000 Units by mouth daily.    Historical Provider, MD  dexlansoprazole (DEXILANT) 60 MG capsule Take 1 capsule (60 mg total) by mouth daily. 07/03/16   Midge Minium, MD  dicyclomine (BENTYL) 10 MG capsule TAKE 2 CAPSULES (20 MG TOTAL) BY MOUTH EVERY 6 (SIX) HOURS. 09/07/16   Midge Minium, MD  fexofenadine-pseudoephedrine (ALLEGRA-D ALLERGY & CONGESTION) 180-240 MG 24 hr tablet Take 1 tablet by mouth daily. Patient not taking: Reported  on 12/07/2016 09/02/16   Hassan Rowan, MD  HYDROcodone-acetaminophen (NORCO/VICODIN) 5-325 MG tablet Take 1 tablet by mouth every 6 (six) hours as needed for moderate pain. 12/24/16   Sharyn Creamer, MD  ibuprofen (ADVIL,MOTRIN) 800 MG tablet Take 800 mg by mouth every 8 (eight) hours as needed.    Historical Provider, MD  lisdexamfetamine (VYVANSE) 40 MG capsule TAKE 1 CAPSULE BY MOUTH EVERY MORNING 04/17/16   Historical Provider, MD  Multiple  Vitamin (MULTI-VITAMINS) TABS Take by mouth.    Historical Provider, MD  ondansetron (ZOFRAN ODT) 4 MG disintegrating tablet Take 1 tablet (4 mg total) by mouth every 6 (six) hours as needed for nausea or vomiting. 12/24/16   Sharyn Creamer, MD  pantoprazole (PROTONIX) 40 MG tablet Take 1 tablet (40 mg total) by mouth 2 (two) times daily. 03/08/16   Alford Highland, MD  PARoxetine (PAXIL-CR) 25 MG 24 hr tablet Take by mouth. 02/09/16   Historical Provider, MD  promethazine (PHENERGAN) 25 MG tablet Take 25 mg by mouth every 6 (six) hours as needed. for nausea 04/09/16   Historical Provider, MD  sucralfate (CARAFATE) 1 GM/10ML suspension Take 10 mLs (1 g total) by mouth 4 (four) times daily -  with meals and at bedtime. 07/12/16   Midge Minium, MD  SUMAtriptan (IMITREX) 50 MG tablet Take 50 mg by mouth. 05/10/16 05/11/17  Historical Provider, MD  TRINTELLIX 10 MG TABS TAKE 1 TABLET EVERY DAY (PRIOR AUTH PENDING) 11/15/16   Historical Provider, MD     Allergies Succinylcholine chloride and Tape   Family History  Problem Relation Age of Onset  . Heart attack Mother   . Hypertension Father     Social History Social History  Substance Use Topics  . Smoking status: Current Every Day Smoker    Packs/day: 0.50    Years: 10.00    Types: Cigarettes  . Smokeless tobacco: Never Used  . Alcohol use No     Comment: occasionally    Review of Systems  Constitutional:   No fever or chills.  ENT:   No sore throat. No rhinorrhea. Lymphatic: No swollen glands, No extremity swelling Endocrine: No hot/cold flashes. No significant weight change. No neck swelling. Cardiovascular:   No chest pain or syncope. Respiratory:   No dyspnea or cough. Gastrointestinal:   Chronic generalized abdominal pain related to IBS and ovarian cysts. She is due for her period as well.  Genitourinary:   Negative for dysuria or difficulty urinating. Musculoskeletal:   Negative for focal pain or swelling Neurological:   Negative for  headaches or weakness. All other systems reviewed and are negative except as documented above in ROS and HPI.  ____________________________________________   PHYSICAL EXAM:  VITAL SIGNS: ED Triage Vitals  Enc Vitals Group     BP 01/23/17 0359 (!) 126/91     Pulse Rate 01/23/17 0359 (!) 115     Resp 01/23/17 0359 18     Temp 01/23/17 0359 98.6 F (37 C)     Temp Source 01/23/17 0359 Oral     SpO2 01/23/17 0359 100 %     Weight 01/23/17 0359 135 lb (61.2 kg)     Height 01/23/17 0359  (1.575 m)     Head Circumference --      Peak Flow --      Pain Score 01/23/17 0358 0     Pain Loc --      Pain Edu? --      Excl. in GC? --  Vital signs reviewed, nursing assessments reviewed.   Constitutional:   Alert and oriented. Well appearing and in no distress. Eyes:   No scleral icterus. No conjunctival pallor. PERRL. EOMI.  No nystagmus. ENT   Head:   Normocephalic and atraumatic.   Nose:   No congestion/rhinnorhea. No septal hematoma   Mouth/Throat:   MMM, no pharyngeal erythema. No peritonsillar mass.    Neck:   No stridor. No SubQ emphysema. No meningismus. Hematological/Lymphatic/Immunilogical:   No cervical lymphadenopathy. Cardiovascular:   RRR. Symmetric bilateral radial and DP pulses.  No murmurs.  Respiratory:   Normal respiratory effort without tachypnea nor retractions. Breath sounds are clear and equal bilaterally. No wheezes/rales/rhonchi. Gastrointestinal:   Soft and without focal tenderness. Non distended. There is no CVA tenderness.  No rebound, rigidity, or guarding. Genitourinary:   deferred Musculoskeletal:   Normal range of motion in all extremities. No joint effusions.  No lower extremity tenderness.  No edema. Neurologic:   Normal speech and language.  CN 2-10 normal. Motor grossly intact. No gross focal neurologic deficits are appreciated.  Skin:    Skin is warm, dry and intact. No rash noted.  No petechiae, purpura, or  bullae.  ____________________________________________    LABS (pertinent positives/negatives) (all labs ordered are listed, but only abnormal results are displayed) Labs Reviewed  COMPREHENSIVE METABOLIC PANEL - Abnormal; Notable for the following:       Result Value   Potassium 2.9 (*)    BUN 21 (*)    ALT 12 (*)    All other components within normal limits  ETHANOL  CBC WITH DIFFERENTIAL/PLATELET  RAPID URINE DRUG SCREEN, HOSP PERFORMED  URINE DRUG SCREEN, QUALITATIVE ([facility] ONLY)   ____________________________________________   EKG    ____________________________________________    RADIOLOGY  No results found.  ____________________________________________   PROCEDURES Procedures  ____________________________________________   INITIAL IMPRESSION / ASSESSMENT AND PLAN / ED COURSE  Pertinent labs & imaging results that were available during my care of the patient were reviewed by me and considered in my medical decision making (see chart for details).  Patient's well-appearing no acute distress. Medically clear. Presents with symptoms of depression. Not committable at this time, not a danger to herself or others. She feels safe at home and is not worried that she would do anything to harm herself. She is amenable to remaining in the emergency department voluntarily for a psychiatry consult given her worsening depression symptoms.         ____________________________________________   FINAL CLINICAL IMPRESSION(S) / ED DIAGNOSES  Final diagnoses:  Moderate episode of recurrent major depressive disorder [facility])      New Prescriptions   No medications on file     Portions of this note were generated with dragon dictation software. Dictation errors may occur despite best attempts at proofreading.    Sharman Cheek, MD 01/23/17 [phone]

## 2017-01-23 NOTE — ED Notes (Signed)
Pt given lunch tray.

## 2017-01-23 NOTE — ED Notes (Signed)
MD Clapacs at bedside  

## 2017-01-23 NOTE — ED Triage Notes (Signed)
Pt presents to ED with c/o depression. Pt states she has has been suffering from depression for a long time now and has gotten progressively worse the past month. Denies HI or SI. Tearful in triage. Pt states there was not a trigger for feeling this way.
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
## 2017-01-24 ENCOUNTER — Encounter: Payer: Self-pay | Admitting: Psychiatry

## 2017-01-24 DIAGNOSIS — F17200 Nicotine dependence, unspecified, uncomplicated: Secondary | ICD-10-CM | POA: Diagnosis present

## 2017-01-24 DIAGNOSIS — F332 Major depressive disorder, recurrent severe without psychotic features: Principal | ICD-10-CM

## 2017-01-24 DIAGNOSIS — F1220 Cannabis dependence, uncomplicated: Secondary | ICD-10-CM | POA: Diagnosis present

## 2017-01-24 DIAGNOSIS — F1120 Opioid dependence, uncomplicated: Secondary | ICD-10-CM | POA: Diagnosis present

## 2017-01-24 LAB — TSH: TSH: 0.781 u[IU]/mL (ref 0.350–4.500)

## 2017-01-24 LAB — LIPID PANEL
CHOL/HDL RATIO: 3.4 ratio
Cholesterol: 162 mg/dL (ref 0–200)
HDL: 47 mg/dL (ref >40)
LDL CALC: 100 mg/dL — AB (ref 0–99)
TRIGLYCERIDES: 75 mg/dL (ref <150)
VLDL: 15 mg/dL (ref 0–40)

## 2017-01-24 MED ORDER — BUPRENORPHINE HCL-NALOXONE HCL 8-2 MG SL SUBL
1.0000 | SUBLINGUAL_TABLET | Freq: Every day | SUBLINGUAL | Status: DC
  Filled 2017-01-24: qty 1

## 2017-01-24 MED ORDER — ENSURE ENLIVE PO LIQD
237.0000 mL | Freq: Two times a day (BID) | ORAL | Status: DC
  Administered 2017-01-26: 237 mL via ORAL

## 2017-01-24 MED ORDER — IBUPROFEN 600 MG PO TABS
600.0000 mg | ORAL_TABLET | Freq: Four times a day (QID) | ORAL | Status: DC | PRN
  Administered 2017-01-24 – 2017-01-25 (×2): 600 mg via ORAL
  Filled 2017-01-24 (×2): qty 1

## 2017-01-24 MED ORDER — CHLORDIAZEPOXIDE HCL 25 MG PO CAPS
50.0000 mg | ORAL_CAPSULE | Freq: Three times a day (TID) | ORAL | Status: DC
  Administered 2017-01-24 – 2017-01-25 (×4): 50 mg via ORAL
  Filled 2017-01-24 (×4): qty 2

## 2017-01-24 MED ORDER — ONDANSETRON 4 MG PO TBDP
8.0000 mg | ORAL_TABLET | Freq: Three times a day (TID) | ORAL | Status: DC | PRN
  Administered 2017-01-24: 8 mg via ORAL
  Filled 2017-01-24: qty 2

## 2017-01-24 MED ORDER — DICYCLOMINE HCL 10 MG PO CAPS
10.0000 mg | ORAL_CAPSULE | Freq: Three times a day (TID) | ORAL | Status: DC | PRN
  Administered 2017-01-25: 10 mg via ORAL
  Filled 2017-01-24: qty 1

## 2017-01-24 MED ORDER — NICOTINE 21 MG/24HR TD PT24
21.0000 mg | MEDICATED_PATCH | Freq: Every day | TRANSDERMAL | Status: DC
  Administered 2017-01-25 – 2017-01-26 (×2): 21 mg via TRANSDERMAL
  Filled 2017-01-24 (×2): qty 1

## 2017-01-24 NOTE — [facility] Group Notes (Signed)
[facility] LCSW Group Therapy  01/24/2017 10:30 AM  Type of Therapy:  Group Therapy  Participation Level:  Patient did not attend group. CSW invited patient to group.   Summary of Progress/Problems: Emotional Regulation: Patients will identify both negative and positive emotions. They will discuss emotions they have difficulty regulating and how they impact their lives. Patients will be asked to identify healthy coping skills to combat unhealthy reactions to negative emotions.   Lorne Winkels G. Garnette Czech MSW, LCSWA 01/24/2017, 10:30 AM

## 2017-01-24 NOTE — Progress Notes (Signed)
NUTRITION ASSESSMENT  Pt identified as at risk on the Malnutrition Screen Tool  INTERVENTION: 1. Educated patient on the importance of nutrition and encouraged intake of food and beverages. 2. Discussed weight goals. 3. Supplements: Ensure Enlive po BID, each supplement provides 350 kcal and 20 grams of protein. Patient prefers vanilla or chocolate.  NUTRITION DIAGNOSIS: Unintentional weight loss related to sub-optimal intake as evidenced by pt report.   Goal: Pt to meet >/= 90% of their estimated nutrition needs.  Monitor:  PO intake  Assessment:  31 y.o. female with PMHx of GERD, colitis who presents voluntarily due to worsening depression.  Patient reports she has had a poor appetite for a few months now. Her poor appetite is partially related to her colitis, which causes occasional abdominal pain. She reports she is also not eating well due to her anxiety/worrying. Reports eating only one meal per day now. This meal is usually a meat with vegetables. Unable to provide more details on intake. She used to eat more than one meal per day. UBW 150 lbs. Patient has lost 17 lbs (11.3% body weight) over 5 months, which is significant for time frame. Patient reports she would like to stop losing weight. She was able to eat her breakfast this morning, but did not eat dinner last night. She is amenable to attempting to eat meals and snacks here. Also amenable to drinking Ensure Enlive.  Patient at risk for severe acute malnutrition due to significant amount of weight loss for time period. Without further details on recent nutrition history/intake, unable to make diagnosis as two criterion need to be meet.  Height: Ht Readings from Last 1 Encounters:  01/23/17  (1.575 m)    Weight: Wt Readings from Last 1 Encounters:  01/23/17 133 lb (60.3 kg)    Weight Hx: Wt Readings from Last 10 Encounters:  01/23/17 133 lb (60.3 kg)  01/23/17 135 lb (61.2 kg)  12/24/16 135 lb (61.2 kg)   12/07/16 140 lb (63.5 kg)  10/06/16 147 lb (66.7 kg)  09/02/16 150 lb (68 kg)  07/03/16 146 lb (66.2 kg)  06/08/16 143 lb (64.9 kg)  03/18/16 138 lb (62.6 kg)  03/07/16 148 lb (67.1 kg)    BMI:  Body mass index is 24.33 kg/m. Pt meets criteria for normal weight based on current BMI.  Estimated Nutritional Needs: Kcal: 25-30 kcal/kg Protein: > 1 gram protein/kg Fluid: 1 ml/kcal  Diet Order: Diet regular Room service appropriate? No; Fluid consistency: Thin Pt is also offered choice of unit snacks mid-morning and mid-afternoon.  Pt is eating as desired.   Lab results and medications reviewed.   Helane Rima, MS, RD, LDN Pager: [phone] After Hours Pager: [phone]

## 2017-01-24 NOTE — Progress Notes (Signed)
Patient in her room during this assessment. Mood and affect sad and depressed. She denied SI/HI and denied Hallucinations. Patient reported that her blood pressure was low earlier . Writer encouraged patient to hydrate more. She received HS medication without difficulty. Q 15 minute check continues for safety.

## 2017-01-24 NOTE — [facility] Suicide Risk Assessment (Signed)
[facility] Admission Suicide Risk Assessment   Nursing information obtained from:  Patient Demographic factors:  Adolescent or young adult, Living alone Current Mental Status:  NA Loss Factors:  Financial problems / change in socioeconomic status, Decline in physical health Historical Factors:  NA Risk Reduction Factors:  Responsible for children under 32 years of age  Total Time spent with patient: 1 hour Principal Problem: Severe recurrent major depression without psychotic features (HCC) Diagnosis:   Patient Active Problem List   Diagnosis Date Noted  . Opioid use disorder, severe, dependence (HCC) [F11.20] 01/24/2017  . Tobacco use disorder [F17.200] 01/24/2017  . Cannabis use disorder, moderate, dependence (HCC) [F12.20] 01/24/2017  . Severe recurrent major depression without psychotic features (HCC) [F33.2] 01/23/2017  . GERD (gastroesophageal reflux disease) [K21.9] 02/28/2016   Subjective Data:   Continued Clinical Symptoms:  Alcohol Use Disorder Identification Test Final Score (AUDIT): 0 The "Alcohol Use Disorders Identification Test", Guidelines for Use in Primary Care, Second Edition.  World Science writer [facility]). Score between 0-7:  no or low risk or alcohol related problems. Score between 8-15:  moderate risk of alcohol related problems. Score between 16-19:  high risk of alcohol related problems. Score 20 or above:  warrants further diagnostic evaluation for alcohol dependence and treatment.   CLINICAL FACTORS:   Depression:   Hopelessness Insomnia Severe Alcohol/Substance Abuse/Dependencies More than one psychiatric diagnosis Previous Psychiatric Diagnoses and Treatments     Psychiatric Specialty Exam: Physical Exam  ROS  Blood pressure 98/64, pulse 92, temperature 98.7 F (37.1 C), temperature source Oral, resp. rate 18, height  (1.575 m), weight 60.3 kg (133 lb), SpO2 100 %.Body mass index is 24.33 kg/m.                                                     Sleep:  Number of Hours: 6      COGNITIVE FEATURES THAT CONTRIBUTE TO RISK:  Closed-mindedness    SUICIDE RISK:   Moderate:  Frequent suicidal ideation with limited intensity, and duration, some specificity in terms of plans, no associated intent, good self-control, limited dysphoria/symptomatology, some risk factors present, and identifiable protective factors, including available and accessible social support.  PLAN OF CARE: admit to [facility]  I certify that inpatient services furnished can reasonably be expected to improve the patient's condition.   Jimmy Footman, MD 01/24/2017, 4:06 PM

## 2017-01-24 NOTE — Progress Notes (Signed)
Patient ID: Vicki Dominguez, female   DOB: 22-Feb-1986, 31 y.o.   MRN: [id number]  Multiple, colorful, Biblical Verses, personal VIP names tattoo sites; "I have 49 of them and some piercing too; I work as an Teacher, English as a foreign language and hope to have my own Midwife; I have 2 sons, [name] 12 and [name] 7; I am the oldest of 3 girls, my depression got worse after mother died.Marland KitchenMarland KitchenDad, 43, is an alcoholic with COPD very bad .Marland KitchenMarland KitchenMarland KitchenI have Colitis, GERD, painful left ovarian cyst ...." Patient was allowed to vent, denied suicidal thoughts at this time; mood and affect appropriate; c/o nausea and insomnia, promethazine tablet 25 mg and trazodone tablet 100 mg given respectively.

## 2017-01-24 NOTE — Plan of Care (Signed)
Problem: Coping: Goal: Ability to verbalize feelings will improve Outcome: Progressing  Working on Materials engineer

## 2017-01-24 NOTE — Progress Notes (Signed)
D;Patient has presented with low pressure  throughout shift . Voice of ongoing  Headache .Patient stated slept good last night .Stated appetite is good and energy level  Is normal. Stated concentration is good . Stated on Depression scale 7, hopeless 8 and anxiety 7 .( low 0-10 high) Denies suicidal  homicidal ideations  .  No auditory hallucinations  No pain concerns . Appropriate ADL'S. Interacting with peers and staff.  Slept  During shift . Patient signed 78 AMA.  Working on Airline pilot A: Encourage patient participation with unit programming . Instruction  Given on  Medication , verbalize understanding. R: Voice no other concerns. Staff continue to monitor

## 2017-01-24 NOTE — Plan of Care (Signed)
Problem: Activity: Goal: Sleeping patterns will improve Outcome: Progressing Patient slept for Estimated Hours of 6; every 15 minutes safety round maintained, no injury or falls during this shift.    

## 2017-01-24 NOTE — Progress Notes (Signed)
Recreation Therapy Notes  INPATIENT RECREATION THERAPY ASSESSMENT  Patient Details Name: Vicki Dominguez MRN: [id number] DOB: Nov 17, 1985 Today's Date: 01/24/2017  Patient Stressors: Family, Relationship, Death, Other (Comment) (Not good relationship with dad - only sees him on holidays and birthdays, does not see sisters often - they used to depend on her but it's gotten better; married but separated for 2 years; uncle died recently; finances; life; 2 boys - oldest has autism)  Coping Skills:   Isolate, Avoidance, Art/Dance, Talking, Music, Sports, Other (Comment) (Try to get mind off of things, read)  Personal Challenges: Anger, Communication, Decision-Making, Relationships, Self-Esteem/Confidence, Social Interaction, Stress Management, Time Management, Trusting Others, Work Nutritional therapist (2+):  Art - Draw, Individual - Reading  Awareness of Community Resources:  Yes  Community Resources:  Library, [employer]  Current Use: Yes  If no, Barriers?:    Patient Strengths:  Big heart, trustworthy  Patient Identified Areas of Improvement:  Depression, more structured schedule, have more of a drive  Current Recreation Participation:  Sit outside, listen to music, read, draw, smoke cigarettes  Patient Goal for Hospitalization:  To feel better and be happy  [place] of Residence:  [place] of Residence:  [place]   Current SI (including self-harm):  No  Current HI:  No  Consent to Intern Participation: N/A   Jacquelynn Cree, LRT/CTRS 01/24/2017, 3:44 PM

## 2017-01-24 NOTE — [facility] Suicide Risk Assessment (Signed)
[facility] INPATIENT:  Family/Significant Other Suicide Prevention Education  Suicide Prevention Education:  Education Completed; Purcell Nails, ex-husband, [phone], has been identified by the patient as the family member/significant other with whom the patient will be residing, and identified as the person(s) who will aid the patient in the event of a mental health crisis (suicidal ideations/suicide attempt).  With written consent from the patient, the family member/significant other has been provided the following suicide prevention education, prior to the and/or following the discharge of the patient.  The suicide prevention education provided includes the following:  Suicide risk factors  Suicide prevention and interventions  National Suicide Hotline telephone number  [facility] assessment telephone number  [facility] Emergency Assistance 911  [facility] and/or Residential Mobile Crisis Unit telephone number  Request made of family/significant other to:  Remove weapons (e.g., guns, rifles, knives), all items previously/currently identified as safety concern.  Celene Kras not aware of any guns in the home.  Remove drugs/medications (over-the-counter, prescriptions, illicit drugs), all items previously/currently identified as a safety concern.  The family member/significant other verbalizes understanding of the suicide prevention education information provided.  The family member/significant other agrees to remove the items of safety concern listed above.  Claude aware that pt tends to worry a lot and to struggle with depression.  He is willing to support any way he can and will check in with pt moving forward.  Lorri Frederick, LCSW 01/24/2017, 3:58 PM

## 2017-01-24 NOTE — Progress Notes (Signed)
Recreation Therapy Notes  Date: 05.02.18 Time: 1:00 pm Location: Craft Room  Group Topic: Self-esteem  Goal Area(s) Addresses:  Patient will write at least one positive trait about self. Patient will verbalize benefit of having a healthy self-esteem.  Behavioral Response: Attentive, Interactive  Intervention: I Am  Activity: Patients were given a worksheet with the letter I on it and were instructed to write as many positive traits about themselves inside the letter.  Education: LRT educated patients on ways they can increase their self-esteem.   Education Outcome: Acknowledges education/In group clarification offered   Clinical Observations/Feedback: Patient wrote positive traits about self. Patient contributed to group discussion by stating it what makes it difficult to think of positive traits, how people affect self-esteem, and how she can increase her self-esteem.  Jacquelynn Cree, LRT/CTRS 01/24/2017 1:47 PM

## 2017-01-24 NOTE — Progress Notes (Signed)
Chaplain responded to an order in behavioral health. Patient was asleep and she said that she was not ready for a Chaplain visit. A follow up visit will be appropriate to this patient.   01/24/17 1000  Clinical Encounter Type  Visited With Patient  Visit Type Initial  Referral From Nurse  Consult/Referral To Chaplain  Spiritual Encounters  Spiritual Needs Emotional

## 2017-01-24 NOTE — Tx Team (Signed)
Interdisciplinary Treatment and Diagnostic Plan Update  01/24/2017 Time of Session: [place] MRN: [id number]  Principal Diagnosis: Severe recurrent major depression without psychotic features [facility])  Secondary Diagnoses: Principal Problem:   Severe recurrent major depression without psychotic features ([place]) Active Problems:   GERD (gastroesophageal reflux disease)   Opioid use disorder, severe, dependence (HCC)   Tobacco use disorder   Cannabis use disorder, moderate, dependence ([place])   Current Medications:  Current Facility-Administered Medications  Medication Dose Route Frequency Provider Last Rate Last Dose  . acetaminophen (TYLENOL) tablet 650 mg  650 mg Oral Q6H PRN Gonzella Lex, MD   650 mg at 01/24/17 0803  . alum & mag hydroxide-simeth (MAALOX/MYLANTA) 200-200-20 MG/5ML suspension 30 mL  30 mL Oral Q4H PRN Gonzella Lex, MD      . dicyclomine (BENTYL) capsule 10 mg  10 mg Oral TID AC Gonzella Lex, MD   10 mg at 01/24/17 0803  . feeding supplement (ENSURE ENLIVE) (ENSURE ENLIVE) liquid 237 mL  237 mL Oral BID BM Hildred Priest, MD      . magnesium hydroxide (MILK OF MAGNESIA) suspension 30 mL  30 mL Oral Daily PRN Gonzella Lex, MD      . pantoprazole (PROTONIX) EC tablet 40 mg  40 mg Oral Daily Gonzella Lex, MD   40 mg at 01/24/17 0803  . promethazine (PHENERGAN) tablet 25 mg  25 mg Oral Q6H PRN Gonzella Lex, MD   25 mg at 01/23/17 2134  . sertraline (ZOLOFT) tablet 50 mg  50 mg Oral Daily Gonzella Lex, MD   50 mg at 01/24/17 0802  . traZODone (DESYREL) tablet 100 mg  100 mg Oral QHS PRN Gonzella Lex, MD   100 mg at 01/23/17 2132   PTA Medications: Prescriptions Prior to Admission  Medication Sig Dispense Refill Last Dose  . celecoxib (CELEBREX) 200 MG capsule Take 1 capsule (200 mg total) by mouth 2 (two) times daily as needed for moderate pain. Take with food (Patient taking differently: Take 100 mg by mouth 2 (two) times daily as needed  for moderate pain. Take with food) 30 capsule 0 12/06/2016 at 0800  . ondansetron (ZOFRAN ODT) 4 MG disintegrating tablet Take 1 tablet (4 mg total) by mouth every 6 (six) hours as needed for nausea or vomiting. 20 tablet 0 prn at prn  . sucralfate (CARAFATE) 1 GM/10ML suspension Take 10 mLs (1 g total) by mouth 4 (four) times daily -  with meals and at bedtime. 420 mL 3 prn at prn  . SUMAtriptan (IMITREX) 50 MG tablet Take 50 mg by mouth.   prn at prn  . TRINTELLIX 10 MG TABS TAKE 1 TABLET EVERY DAY (PRIOR AUTH PENDING)  4   . [DISCONTINUED] alprazolam (XANAX) 2 MG tablet Take 2 mg by mouth 2 (two) times daily.  2   . ascorbic acid (VITAMIN C) 500 MG tablet Take 500 mg by mouth daily.   12/06/2016 at 0800  . benzonatate (TESSALON) 200 MG capsule Take 1 capsule (200 mg total) by mouth 3 (three) times daily as needed. (Patient not taking: Reported on 01/23/2017) 30 capsule 0 Not Taking at Unknown time  . buprenorphine (SUBUTEX) 8 MG SUBL SL tablet TAKE 1 TABLET BY MOUTH EVERY DAY FOR 2 DAYS  0 Not Taking at Unknown time  . cholecalciferol (VITAMIN D) 1000 units tablet Take 1,000 Units by mouth daily.   12/06/2016 at 0800  . dexlansoprazole (DEXILANT) 60  MG capsule Take 1 capsule (60 mg total) by mouth daily. (Patient not taking: Reported on 01/23/2017) 30 capsule 11 Not Taking at Unknown time  . dicyclomine (BENTYL) 10 MG capsule TAKE 2 CAPSULES (20 MG TOTAL) BY MOUTH EVERY 6 (SIX) HOURS. (Patient not taking: Reported on 01/23/2017) 240 capsule 0 Not Taking at Unknown time  . fexofenadine-pseudoephedrine (ALLEGRA-D ALLERGY & CONGESTION) 180-240 MG 24 hr tablet Take 1 tablet by mouth daily. (Patient not taking: Reported on 12/07/2016) 30 tablet 0 Not Taking at Unknown time  . pantoprazole (PROTONIX) 40 MG tablet Take 1 tablet (40 mg total) by mouth 2 (two) times daily. (Patient not taking: Reported on 01/23/2017) 60 tablet 0 Not Taking at Unknown time  . promethazine (PHENERGAN) 25 MG tablet Take 25 mg by mouth  every 6 (six) hours as needed. for nausea  4 prn at prn  . [DISCONTINUED] HYDROcodone-acetaminophen (NORCO/VICODIN) 5-325 MG tablet Take 1 tablet by mouth every 6 (six) hours as needed for moderate pain. (Patient not taking: Reported on 01/23/2017) 10 tablet 0 Completed Course at Unknown time    Patient Stressors: Financial difficulties Health problems Substance abuse  Patient Strengths: Ability for insight Average or above average intelligence Capable of independent living Armed forces logistics/support/administrative officer Motivation for treatment/growth  Treatment Modalities: Medication Management, Group therapy, Case management,  1 to 1 session with clinician, Psychoeducation, Recreational therapy.   Physician Treatment Plan for Primary Diagnosis: Severe recurrent major depression without psychotic features ([place]) Long Term Goal(s):     Short Term Goals:    Medication Management: Evaluate patient's response, side effects, and tolerance of medication regimen.  Therapeutic Interventions: 1 to 1 sessions, Unit Group sessions and Medication administration.  Evaluation of Outcomes: Not Met  Physician Treatment Plan for Secondary Diagnosis: Principal Problem:   Severe recurrent major depression without psychotic features ([place]) Active Problems:   GERD (gastroesophageal reflux disease)   Opioid use disorder, severe, dependence (HCC)   Tobacco use disorder   Cannabis use disorder, moderate, dependence ([place])  Long Term Goal(s):     Short Term Goals:       Medication Management: Evaluate patient's response, side effects, and tolerance of medication regimen.  Therapeutic Interventions: 1 to 1 sessions, Unit Group sessions and Medication administration.  Evaluation of Outcomes: Not Met   RN Treatment Plan for Primary Diagnosis: Severe recurrent major depression without psychotic features ([place]) Long Term Goal(s): Knowledge of disease and therapeutic regimen to maintain health will improve  Short Term Goals:  Ability to identify and develop effective coping behaviors will improve and Compliance with prescribed medications will improve  Medication Management: RN will administer medications as ordered by provider, will assess and evaluate patient's response and provide education to patient for prescribed medication. RN will report any adverse and/or side effects to prescribing provider.  Therapeutic Interventions: 1 on 1 counseling sessions, Psychoeducation, Medication administration, Evaluate responses to treatment, Monitor vital signs and CBGs as ordered, Perform/monitor CIWA, COWS, AIMS and Fall Risk screenings as ordered, Perform wound care treatments as ordered.  Evaluation of Outcomes: Not Met   LCSW Treatment Plan for Primary Diagnosis: Severe recurrent major depression without psychotic features ([facility]) Long Term Goal(s): Safe transition to appropriate next level of care at discharge, Engage patient in therapeutic group addressing interpersonal concerns.  Short Term Goals: Engage patient in aftercare planning with referrals and resources, Increase social support and Increase skills for wellness and recovery  Therapeutic Interventions: Assess for all discharge needs, 1 to 1 time with Social worker, Explore available  resources and support systems, Assess for adequacy in community support network, Educate family and significant other(s) on suicide prevention, Complete Psychosocial Assessment, Interpersonal group therapy.  Evaluation of Outcomes: Not Met    Recreational Therapy Treatment Plan for Primary Diagnosis: Severe recurrent major depression without psychotic features ([place]) Long Term Goal(s): Patient will participate in recreation therapy treatment in at least 2 group sessions without prompting from LRT  Short Term Goals: Increase self-esteem, Increase stress management skills  Treatment Modalities: Group Therapy and Individual Treatment Sessions  Therapeutic Interventions:  Psychoeducation  Evaluation of Outcomes: Progressing   Progress in Treatment: Attending groups: No. Participating in groups: No. Taking medication as prescribed: Yes. Toleration medication: Yes. Family/Significant other contact made: No, will contact:  when given permission Patient understands diagnosis: Yes. Discussing patient identified problems/goals with staff: Yes. Medical problems stabilized or resolved: Yes. Denies suicidal/homicidal ideation: Yes. Issues/concerns per patient self-inventory: No. Other: none  New problem(s) identified: No, Describe:  none  New Short Term/Long Term Goal(s): Pt goal: To address her depression, particularly feeling hopeless, lack of motivation, and too much sleep.  Discharge Plan or Barriers: Pt will return to outpt services.  Reason for Continuation of Hospitalization: Depression Medication stabilization  Estimated Length of Stay: 3-5 days   Attendees: Patient: Vicki Dominguez 01/24/2017   Physician: Dr. Jerilee Hoh, MD 01/24/2017   Nursing: Polly Cobia, RN 01/24/2017   RN Care Manager: 01/24/2017  Social Worker: Lurline Idol, LCSW 01/24/2017   Recreational Therapist: Drue Flirt, LRT/CTRS  01/24/2017   Other:  01/24/2017  Other:  01/24/2017   Other: 01/24/2017      Scribe for Treatment Team: Joanne Chars, [place] 01/24/2017 1:02 PM
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
## 2017-01-25 ENCOUNTER — Encounter: Payer: Self-pay | Admitting: Psychiatry

## 2017-01-25 LAB — HEMOGLOBIN A1C
Hgb A1c MFr Bld: 5.1 % (ref 4.8–5.6)
Mean Plasma Glucose: 100 mg/dL

## 2017-01-25 MED ORDER — PANTOPRAZOLE SODIUM 40 MG PO TBEC: 40.0000 mg | DELAYED_RELEASE_TABLET | Freq: Every day | ORAL | 0 refills | Status: DC

## 2017-01-25 MED ORDER — ACETAMINOPHEN 500 MG PO TABS
1000.0000 mg | ORAL_TABLET | Freq: Four times a day (QID) | ORAL | Status: DC | PRN
  Administered 2017-01-25: 1000 mg via ORAL
  Filled 2017-01-25: qty 2

## 2017-01-25 MED ORDER — TRAZODONE HCL 50 MG PO TABS: 50.0000 mg | ORAL_TABLET | Freq: Every evening | ORAL | 0 refills | Status: DC | PRN

## 2017-01-25 MED ORDER — CHLORDIAZEPOXIDE HCL 25 MG PO CAPS
25.0000 mg | ORAL_CAPSULE | Freq: Three times a day (TID) | ORAL | Status: DC
  Administered 2017-01-26 (×2): 25 mg via ORAL
  Filled 2017-01-25 (×2): qty 1

## 2017-01-25 MED ORDER — IBUPROFEN 200 MG PO TABS
400.0000 mg | ORAL_TABLET | Freq: Three times a day (TID) | ORAL | Status: DC
  Administered 2017-01-25 – 2017-01-26 (×4): 400 mg via ORAL
  Filled 2017-01-25 (×4): qty 2

## 2017-01-25 MED ORDER — SERTRALINE HCL 50 MG PO TABS: 50.0000 mg | ORAL_TABLET | Freq: Every day | ORAL | 0 refills | Status: DC

## 2017-01-25 MED ORDER — GABAPENTIN 300 MG PO CAPS: 300.0000 mg | ORAL_CAPSULE | Freq: Three times a day (TID) | ORAL | 0 refills | Status: DC

## 2017-01-25 MED ORDER — GABAPENTIN 100 MG PO CAPS
100.0000 mg | ORAL_CAPSULE | Freq: Three times a day (TID) | ORAL | Status: DC
  Administered 2017-01-25 – 2017-01-26 (×3): 100 mg via ORAL
  Filled 2017-01-25 (×3): qty 1

## 2017-01-25 MED ORDER — LOPERAMIDE HCL 2 MG PO CAPS: 2.0000 mg | ORAL_CAPSULE | ORAL | Status: DC | PRN

## 2017-01-25 MED ORDER — TRAZODONE HCL 50 MG PO TABS
50.0000 mg | ORAL_TABLET | Freq: Every evening | ORAL | Status: DC | PRN
Start: 2017-01-25 — End: 2017-01-26
  Administered 2017-01-25: 50 mg via ORAL
  Filled 2017-01-25: qty 1

## 2017-01-25 NOTE — Progress Notes (Signed)
Patient appeared bright on approach. She endorsed a good day, denied SI/HI and denied Hallucinations. Her mood and affects appropriate. She seemed ready for discharged tomorrow." I'm ready to go home and see my boys". She denied SI/HI and denied Hallucinations. Writer encouraged and supported patient. Q 15 minute check continues as ordered for safety.

## 2017-01-25 NOTE — Plan of Care (Signed)
Problem: [facility] Participation in Recreation Therapeutic Interventions Goal: STG-Patient will demonstrate improved self esteem by identif STG: Self-Esteem - Within 4 treatment sessions, patient will verbalize at least 5 positive affirmation statements in each of 2 treatment sessions to increase self-esteem.  Outcome: Progressing Treatment Session 1; Completed 1 out of 2: At approximately 10:50 am, LRT met with patient in patient room. Patient verbalized 5 positive affirmation statements. Patient reported it felt "good". LRT encouraged patient to continue saying positive affirmation statements.   M , LRT/CTRS 05.03.18 3:45 pm Goal: STG-Other Recreation Therapy Goal (Specify) STG: Stress Management - Within 4 treatment sessions, patient will verbalize understanding of the stress management techniques in each 2 treatment sessions to increase stress management skills.  Outcome: Progressing Treatment Session 1; Completed 1 out of 2: At approximately 10:50 am, LRT met with patient in patient room. LRT educated and provided patient with handouts on stress management techniques. Patient verbalized understanding. LRT encouraged patient to read over and practice the stress management techniques.   M , LRT/CTRS 05.03.18 3:46 pm   

## 2017-01-25 NOTE — Plan of Care (Signed)
Problem: Coping: Goal: Ability to cope will improve Outcome: Progressing Continue breathing  techniques , and work on Materials engineercoping  skills

## 2017-01-25 NOTE — Progress Notes (Signed)
D: Patient stated slept good last night .Stated appetite is good and energy level  Is normal. Stated concentration is good . Stated on Depression scale 6 , hopeless 4 and anxiety  7.( low 0-10 high) Denies suicidal  homicidal ideations  .  No auditory hallucinations  No pain concerns . Appropriate ADL'S. Interacting with peers and staff. Patient hopping tot go home tomorrow . Aware she can be helded  Until Saturday ,with the 72 AMA form Voice of withdrawal symptoms  A: Encourage patient participation with unit programming . Instruction  Given on  Medication , verbalize understanding. R: Voice no other concerns. Staff continue to monitor

## 2017-01-25 NOTE — Progress Notes (Signed)
Recreation Therapy Notes  Date: 05.03.18 Time: 1:00 pm Location: Craft Room  Group Topic: Leisure Education  Goal Area(s) Addresses:  Patient will identify activities for each letter of the alphabet. Patient will verbalize ability to integrate positive leisure into life post d/c. Patient will verbalize ability to use leisure as a Associate Professorcoping skill.  Behavioral Response: Attentive, Interactive  Intervention: Leisure Alphabet  Activity: Patients were given a Leisure Information systems managerAlphabet worksheet and were instructed to write healthy leisure activities for each letter of the alphabet.  Education: LRT educated patients on what they need to participate in leisure.  Education Outcome: Acknowledges education/In group clarification offered   Clinical Observations/Feedback: Patient wrote healthy leisure activities. Patient contributed to group discussion by stating healthy leisure activities, what she needs to participate in leisure, how she can integrate leisure back into her schedule, and what makes leisure a good coping skill.  Jacquelynn CreeGreene,Ember Henrikson M, LRT/CTRS 01/25/2017 1:47 PM
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
## 2017-01-25 NOTE — Progress Notes (Signed)
Chaplain went to follow up with the patient because she was not able to meet with Chaplain yesterday. She was in bed and today she was in bed when Chaplain went to visit. Most of other patients were in groups, but the patient said that she was asleep. She apologized for sleeping too much and Chaplain said that she should not apologize for that.   01/25/17 1200  Clinical Encounter Type  Visited With Patient  Visit Type Follow-up  Spiritual Encounters  Spiritual Needs Emotional

## 2017-01-25 NOTE — BHH Group Notes (Signed)
[facility] LCSW Group Therapy Note  Date/Time: 01/25/17, 0930  Type of Therapy/Topic:  Group Therapy:  Balance in Life  Participation Level:  Pt did not attend group.  Description of Group:    This group will address the concept of balance and how it feels and looks when one is unbalanced. Patients will be encouraged to process areas in their lives that are out of balance, and identify reasons for remaining unbalanced. Facilitators will guide patients utilizing problem- solving interventions to address and correct the stressor making their life unbalanced. Understanding and applying boundaries will be explored and addressed for obtaining  and maintaining a balanced life. Patients will be encouraged to explore ways to assertively make their unbalanced needs known to significant others in their lives, using other group members and facilitator for support and feedback.  Therapeutic Goals: 1. Patient will identify two or more emotions or situations they have that consume much of in their lives. 2. Patient will identify signs/triggers that life has become out of balance:  3. Patient will identify two ways to set boundaries in order to achieve balance in their lives:  4. Patient will demonstrate ability to communicate their needs through discussion and/or role plays  Summary of Patient Progress:          Therapeutic Modalities:   Cognitive Behavioral Therapy Solution-Focused Therapy Assertiveness Training  Daleen SquibbGreg Claudean Leavelle, LCSW

## 2017-01-25 NOTE — Plan of Care (Signed)
Problem: Activity: Goal: Sleeping patterns will improve Outcome: Progressing Patient slept for Estimated Hours of 7.30; every 15 minutes safety round maintained, no injury or falls during this shift.    

## 2017-01-26 NOTE — BHH Suicide Risk Assessment (Signed)
[facility]   Principal Problem: Severe recurrent major depression without psychotic features [facility](HCC) Discharge Diagnoses:  Patient Active Problem List   Diagnosis Date Noted  . Opioid use disorder, severe, dependence (HCC) [F11.20] 01/24/2017  . Tobacco use disorder [F17.200] 01/24/2017  . Cannabis use disorder, moderate, dependence (HCC) [F12.20] 01/24/2017  . Severe recurrent major depression without psychotic features (HCC) [F33.2] 01/23/2017  . GERD (gastroesophageal reflux disease) [K21.9] 02/28/2016    Total Time spent with patient: 30 minutes  Musculoskeletal: Strength & Muscle Tone: within normal limits Gait & Station: normal Patient leans: N/A  Psychiatric Specialty Exam: Review of Systems  Psychiatric/Behavioral: Positive for substance abuse.  All other systems reviewed and are negative.   Blood pressure (!) 96/59, pulse 84, temperature 98.2 F (36.8 C), temperature source Oral, resp. rate 18, height 5\' 2"  (1.575 m), weight 60.3 kg (133 lb), SpO2 100 %.Body mass index is 24.33 kg/m.  General Appearance: Casual  Eye Contact::  Good  Speech:  Clear and Coherent409  Volume:  Normal  Mood:  Euthymic  Affect:  Appropriate  Thought Process:  Goal Directed and Descriptions of Associations: Intact  Orientation:  Full (Time, Place, and Person)  Thought Content:  WDL  Suicidal Thoughts:  No  Homicidal Thoughts:  No  Memory:  Immediate;   Fair Recent;   Fair Remote;   Fair  Judgement:  Impaired  Insight:  Shallow  Psychomotor Activity:  Normal  Concentration:  Fair  Recall:  [employer]Fair  Fund of Knowledge:Fair  Language: Fair  Akathisia:  No  Handed:  Right  AIMS (if indicated):     Assets:  Communication Skills Desire for Improvement Financial Resources/Insurance Housing Intimacy Physical Health Resilience Social Support  Sleep:  Number of Hours: 3.25  Cognition: WNL  ADL's:  Intact   Mental Status Per Nursing Assessment::   On  Admission:  NA  Demographic Factors:  Caucasian  Loss Factors: NA  Historical Factors: Family history of mental illness or substance abuse and Impulsivity  Risk Reduction Factors:   Responsible for children under 32 years of age, Sense of responsibility to family, Living with another person, especially a relative and Positive social support  Continued Clinical Symptoms:  Depression:   Comorbid alcohol abuse/dependence Impulsivity Alcohol/Substance Abuse/Dependencies  Cognitive Features That Contribute To Risk:  None    Suicide Risk:  Minimal: No identifiable suicidal ideation.  Patients presenting with no risk factors but with morbid ruminations; may be classified as minimal risk based on the severity of the depressive symptoms  Follow-up Information    LUYANDO,YVONNE, MD. Schedule an appointment as soon as possible for a visit.   Specialty:  Specialist Why:  f/u with primary care Contact information: [address] [place] [address] [phone]        [facility]. Go on 01/29/2017.   Why:  Please attend your therapy intake appointment with Beverlee NimsGregory Allen on 01/29/17 at 4:30pm.  Please attend your medication management appointment with Dr Janeece RiggersSu on 02/13/17 at 3pm.  Please bring a copy of your hospital discharge paperwork. Contact information: [address] [place] [address] [phone]           Plan Of Care/Follow-up recommendations:  Activity:  as tolerated. Diet:  low sodium heart healthy. Other:  keep follow up appointment.  Kristine LineaJolanta Milton Streicher, MD 01/26/2017, 10:06 AM

## 2017-01-26 NOTE — Progress Notes (Signed)
Patient denies SI/HI, denies A/V hallucinations. Patient verbalizes understanding of discharge instructions, follow up care and prescriptions. Patient given all belongings from [facility] locker. Patient escorted out by staff, transported by family. 

## 2017-01-26 NOTE — Progress Notes (Signed)
  [facility] :  Will you be returning to the same living situation after discharge:  Yes,  with children At discharge, do you have transportation home?: Yes,  friend Do you have the ability to pay for your medications: Yes,  medicaid  Release of information consent forms completed and in the chart;  Patient's signature needed at discharge.  Patient to Follow up at: Follow-up Information    LUYANDO,YVONNE, MD. Schedule an appointment as soon as possible for a visit.   Specialty:  Specialist Why:  f/u with primary care Contact information: [address] [place] [address] [phone]        [facility]. Go on 01/29/2017.   Why:  Please attend your therapy intake appointment with Beverlee NimsGregory Allen on 01/29/17 at 4:30pm.  Please attend your medication management appointment with Dr Janeece RiggersSu on 02/13/17 at 3pm.  Please bring a copy of your hospital discharge paperwork. Contact information: [address] [place] [address] [phone]           Next level of care provider [facility] Link:no  Safety Planning and Suicide Prevention discussed: Yes,  with ex-husband  Have you used any form of tobacco in the last 30 days? (Cigarettes, Smokeless Tobacco, Cigars, and/or Pipes): Yes  Has patient been referred to the Quitline?: Patient refused referral  Patient has been referred for addiction treatment: Yes  Lorri FrederickWierda, Jeny Nield Jon, LCSW 01/26/2017, 9:59 AM

## 2017-01-26 NOTE — Progress Notes (Signed)
Recreation Therapy Notes  Date: 05.04.18 Time: 1:00 pm Location: Craft Room  Group Topic: Social Skills  Goal Area(s) Addresses:  Patient will effectively work with peer towards shared goal. Patient will identify skills used to make activity successful Patient will identify benefit of using group skills effectively post d/c.  Behavioral Response: Attentive, Interactive  Intervention: [employer] Tower  Activity: Patients were split into groups and given 15 pipe cleaners. Patients were instructed to build a free standing tower. Patients were given 2 minutes to strategize. After building for about 3 minutes, patients were instructed to put their dominant hand behind their backs. After another 2 minutes of building, patients were instructed to stop talking to each other.  Education: LRT educated patients on healthy support systems.  Education Outcome: Acknowledges education/In group clarification offered  Clinical Observations/Feedback: Patient worked with peers to build tower. Patient used effective communication, problem solving, and teamwork skills. Patient contributed to group discussion by stating what skills she used in group, why these skills are important, how she felt after using these skills, what prevents her from using these skills, and what would change if she started using these skills.  Jacquelynn CreeGreene,Katelee Schupp M, LRT/CTRS 01/26/2017 2:21 PM

## 2017-01-26 NOTE — [facility] Group Notes (Signed)
[facility] LCSW Group Therapy  01/26/2017 10:34 AM  Type of Therapy:  Group Therapy  Participation Level:  Patient did not attend group. CSW invited patient to group.   Summary of Progress/Problems: Feelings around Relapse. Group members discussed the meaning of relapse and shared personal stories of relapse, how it affected them and others, and how they perceived themselves during this time. Group members were encouraged to identify triggers, warning signs and coping skills used when facing the possibility of relapse. Social supports were discussed and explored in detail. Patients also discussed facing disappointment and how that can trigger someone to relapse.  Vicki Dominguez G. Garnette CzechSampson MSW, LCSWA 01/26/2017, 10:34 AM

## 2017-01-26 NOTE — Progress Notes (Signed)
Recreation Therapy Notes  INPATIENT RECREATION TR PLAN  Patient Details Name: BRIHANA QUICKEL MRN: [id number] DOB: [birth year] Today's Date: 01/26/2017  Rec Therapy Plan Is patient appropriate for Therapeutic Recreation?: Yes Treatment times per week: At least once a week TR Treatment/Interventions: 1:1 session, Group participation (Comment) (Appropriate participation in daily recreational therapy tx)  Discharge Criteria Pt will be discharged from therapy if:: Treatment goals are met, Discharged Treatment plan/goals/alternatives discussed and agreed upon by:: Patient/family  Discharge Summary Short term goals set: See Care Plan Short term goals met: Complete Progress toward goals comments: One-to-one attended Which groups?: Social skills, Leisure education One-to-one attended: Self-esteem, stress management Reason goals not met: N/A Therapeutic equipment acquired: None Reason patient discharged from therapy: Discharge from hospital Pt/family agrees with progress & goals achieved: Yes Date patient discharged from therapy: 01/26/17   Leonette Monarch, LRT/CTRS 01/26/2017, 2:24 PM
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
## 2017-01-26 NOTE — Tx Team (Signed)
Interdisciplinary Treatment and Diagnostic Plan Update  01/26/2017 Time of Session: 1110 Vicki Dominguez MRN: [id number]  Principal Diagnosis: Severe recurrent major depression without psychotic features [facility](HCC)  Secondary Diagnoses: Principal Problem:   Severe recurrent major depression without psychotic features (HCC) Active Problems:   GERD (gastroesophageal reflux disease)   Opioid use disorder, severe, dependence (HCC)   Tobacco use disorder   Cannabis use disorder, moderate, dependence (HCC)   Current Medications:  Current Facility-Administered Medications  Medication Dose Route Frequency Provider Last Rate Last Dose  . acetaminophen (TYLENOL) tablet 1,000 mg  1,000 mg Oral Q6H PRN Jimmy FootmanAndrea Hernandez-Gonzalez, MD   1,000 mg at 01/25/17 2131  . alum & mag hydroxide-simeth (MAALOX/MYLANTA) 200-200-20 MG/5ML suspension 30 mL  30 mL Oral Q4H PRN Audery AmelJohn T Clapacs, MD      . chlordiazePOXIDE (LIBRIUM) capsule 25 mg  25 mg Oral TID Shari ProwsJolanta B Pucilowska, MD   25 mg at 01/26/17 0754  . dicyclomine (BENTYL) capsule 10 mg  10 mg Oral TID PRN Jimmy FootmanAndrea Hernandez-Gonzalez, MD   10 mg at 01/25/17 0817  . feeding supplement (ENSURE ENLIVE) (ENSURE ENLIVE) liquid 237 mL  237 mL Oral BID BM Jimmy FootmanAndrea Hernandez-Gonzalez, MD   237 mL at 01/26/17 1000  . gabapentin (NEURONTIN) capsule 100 mg  100 mg Oral TID Jimmy FootmanAndrea Hernandez-Gonzalez, MD   100 mg at 01/26/17 0754  . ibuprofen (ADVIL,MOTRIN) tablet 400 mg  400 mg Oral TID Jimmy FootmanAndrea Hernandez-Gonzalez, MD   400 mg at 01/26/17 0754  . loperamide (IMODIUM) capsule 2 mg  2 mg Oral PRN Jimmy FootmanAndrea Hernandez-Gonzalez, MD      . magnesium hydroxide (MILK OF MAGNESIA) suspension 30 mL  30 mL Oral Daily PRN Audery AmelJohn T Clapacs, MD      . nicotine (NICODERM CQ - dosed in mg/24 hours) patch 21 mg  21 mg Transdermal Daily Jimmy FootmanAndrea Hernandez-Gonzalez, MD   21 mg at 01/26/17 0754  . ondansetron (ZOFRAN-ODT) disintegrating tablet 8 mg  8 mg Oral Q8H PRN Jimmy FootmanAndrea Hernandez-Gonzalez, MD   8 mg at  01/24/17 2052  . pantoprazole (PROTONIX) EC tablet 40 mg  40 mg Oral Daily Audery AmelJohn T Clapacs, MD   40 mg at 01/26/17 0754  . sertraline (ZOLOFT) tablet 50 mg  50 mg Oral Daily Audery AmelJohn T Clapacs, MD   50 mg at 01/26/17 0754  . traZODone (DESYREL) tablet 50 mg  50 mg Oral QHS PRN Jimmy FootmanAndrea Hernandez-Gonzalez, MD   50 mg at 01/25/17 2131   PTA Medications: Prescriptions Prior to Admission  Medication Sig Dispense Refill Last Dose  . celecoxib (CELEBREX) 200 MG capsule Take 1 capsule (200 mg total) by mouth 2 (two) times daily as needed for moderate pain. Take with food (Patient taking differently: Take 100 mg by mouth 2 (two) times daily as needed for moderate pain. Take with food) 30 capsule 0 12/06/2016 at 0800  . ondansetron (ZOFRAN ODT) 4 MG disintegrating tablet Take 1 tablet (4 mg total) by mouth every 6 (six) hours as needed for nausea or vomiting. 20 tablet 0 prn at prn  . sucralfate (CARAFATE) 1 GM/10ML suspension Take 10 mLs (1 g total) by mouth 4 (four) times daily -  with meals and at bedtime. 420 mL 3 prn at prn  . SUMAtriptan (IMITREX) 50 MG tablet Take 50 mg by mouth.   prn at prn  . TRINTELLIX 10 MG TABS TAKE 1 TABLET EVERY DAY (PRIOR AUTH PENDING)  4   . [DISCONTINUED] alprazolam (XANAX) 2 MG tablet Take 2 mg  by mouth 2 (two) times daily.  2   . ascorbic acid (VITAMIN C) 500 MG tablet Take 500 mg by mouth daily.   12/06/2016 at 0800  . benzonatate (TESSALON) 200 MG capsule Take 1 capsule (200 mg total) by mouth 3 (three) times daily as needed. (Patient not taking: Reported on 01/23/2017) 30 capsule 0 Not Taking at Unknown time  . buprenorphine (SUBUTEX) 8 MG SUBL SL tablet TAKE 1 TABLET BY MOUTH EVERY DAY FOR 2 DAYS  0 Not Taking at Unknown time  . cholecalciferol (VITAMIN D) 1000 units tablet Take 1,000 Units by mouth daily.   12/06/2016 at 0800  . dexlansoprazole (DEXILANT) 60 MG capsule Take 1 capsule (60 mg total) by mouth daily. (Patient not taking: Reported on 01/23/2017) 30 capsule 11 Not  Taking at Unknown time  . dicyclomine (BENTYL) 10 MG capsule TAKE 2 CAPSULES (20 MG TOTAL) BY MOUTH EVERY 6 (SIX) HOURS. (Patient not taking: Reported on 01/23/2017) 240 capsule 0 Not Taking at Unknown time  . fexofenadine-pseudoephedrine (ALLEGRA-D ALLERGY & CONGESTION) 180-240 MG 24 hr tablet Take 1 tablet by mouth daily. (Patient not taking: Reported on 12/07/2016) 30 tablet 0 Not Taking at Unknown time  . pantoprazole (PROTONIX) 40 MG tablet Take 1 tablet (40 mg total) by mouth 2 (two) times daily. (Patient not taking: Reported on 01/23/2017) 60 tablet 0 Not Taking at Unknown time  . promethazine (PHENERGAN) 25 MG tablet Take 25 mg by mouth every 6 (six) hours as needed. for nausea  4 prn at prn  . [DISCONTINUED] HYDROcodone-acetaminophen (NORCO/VICODIN) 5-325 MG tablet Take 1 tablet by mouth every 6 (six) hours as needed for moderate pain. (Patient not taking: Reported on 01/23/2017) 10 tablet 0 Completed Course at Unknown time    Patient Stressors: Financial difficulties Health problems Substance abuse  Patient Strengths: Ability for insight Average or above average intelligence Capable of independent living Manufacturing systems engineer Motivation for treatment/growth  Treatment Modalities: Medication Management, Group therapy, Case management,  1 to 1 session with clinician, Psychoeducation, Recreational therapy.   Physician Treatment Plan for Primary Diagnosis: Severe recurrent major depression without psychotic features (HCC) Long Term Goal(s): Improvement in symptoms so as ready for discharge Improvement in symptoms so as ready for discharge   Short Term Goals: Ability to identify changes in lifestyle to reduce recurrence of condition will improve Ability to verbalize feelings will improve Ability to demonstrate self-control will improve Ability to identify triggers associated with substance abuse/mental health issues will improve Ability to identify and develop effective coping behaviors  will improve  Medication Management: Evaluate patient's response, side effects, and tolerance of medication regimen.  Therapeutic Interventions: 1 to 1 sessions, Unit Group sessions and Medication administration.  Evaluation of Outcomes: Adequate for Discharge  Physician Treatment Plan for Secondary Diagnosis: Principal Problem:   Severe recurrent major depression without psychotic features (HCC) Active Problems:   GERD (gastroesophageal reflux disease)   Opioid use disorder, severe, dependence (HCC)   Tobacco use disorder   Cannabis use disorder, moderate, dependence (HCC)  Long Term Goal(s): Improvement in symptoms so as ready for discharge Improvement in symptoms so as ready for discharge   Short Term Goals: Ability to identify changes in lifestyle to reduce recurrence of condition will improve Ability to verbalize feelings will improve Ability to demonstrate self-control will improve Ability to identify triggers associated with substance abuse/mental health issues will improve Ability to identify and develop effective coping behaviors will improve     Medication Management: Evaluate patient's response, side  effects, and tolerance of medication regimen.  Therapeutic Interventions: 1 to 1 sessions, Unit Group sessions and Medication administration.  Evaluation of Outcomes: Adequate for Discharge   RN Treatment Plan for Primary Diagnosis: Severe recurrent major depression without psychotic features (HCC) Long Term Goal(s): Knowledge of disease and therapeutic regimen to maintain health will improve  Short Term Goals: Ability to identify and develop effective coping behaviors will improve and Compliance with prescribed medications will improve  Medication Management: RN will administer medications as ordered by provider, will assess and evaluate patient's response and provide education to patient for prescribed medication. RN will report any adverse and/or side effects to  prescribing provider.  Therapeutic Interventions: 1 on 1 counseling sessions, Psychoeducation, Medication administration, Evaluate responses to treatment, Monitor vital signs and CBGs as ordered, Perform/monitor CIWA, COWS, AIMS and Fall Risk screenings as ordered, Perform wound care treatments as ordered.  Evaluation of Outcomes: Adequate for Discharge   LCSW Treatment Plan for Primary Diagnosis: Severe recurrent major depression without psychotic features (HCC) Long Term Goal(s): Safe transition to appropriate next level of care at discharge, Engage patient in therapeutic group addressing interpersonal concerns.  Short Term Goals: Engage patient in aftercare planning with referrals and resources, Increase social support and Increase skills for wellness and recovery  Therapeutic Interventions: Assess for all discharge needs, 1 to 1 time with Social worker, Explore available resources and support systems, Assess for adequacy in community support network, Educate family and significant other(s) on suicide prevention, Complete Psychosocial Assessment, Interpersonal group therapy.  Evaluation of Outcomes: Adequate for Discharge    Recreational Therapy Treatment Plan for Primary Diagnosis: Severe recurrent major depression without psychotic features (HCC) Long Term Goal(s): Patient will participate in recreation therapy treatment in at least 2 group sessions without prompting from LRT  Short Term Goals: Increase self-esteem, Increase stress management skills  Treatment Modalities: Group Therapy and Individual Treatment Sessions  Therapeutic Interventions: Psychoeducation  Evaluation of Outcomes: Adequate for Discharge   Progress in Treatment: Attending groups: No. Participating in groups: No. Taking medication as prescribed: Yes. Toleration medication: Yes. Family/Significant other contact made: Yes, ex husband. Patient understands diagnosis: Yes. Discussing patient identified  problems/goals with staff: Yes. Medical problems stabilized or resolved: Yes. Denies suicidal/homicidal ideation: Yes. Issues/concerns per patient self-inventory: No. Other: none  New problem(s) identified: No, Describe:  none  New Short Term/Long Term Goal(s): Pt goal: To address her depression, particularly feeling hopeless, lack of motivation, and too much sleep.  Discharge Plan or Barriers: Pt will return to outpt services at [facility]  Reason for Continuation of Hospitalization: discharge today  Estimated Length of Stay: Discharge today.   Attendees: Patient: Vicki Dominguez 01/26/2017  Physician: Dr. Jennet Maduro, MD 01/26/2017   Nursing:  01/26/2017   RN Care Manager: 01/26/2017   Social Worker: Daleen Squibb, LCSW 01/26/2017   Recreational Therapist: Princella Ion, LRT/CTRS  01/26/2017   Other:  01/26/2017   Other:  01/26/2017         Scribe for Treatment Team: Lorri Frederick, LCSW 01/26/2017 11:41 AM

## 2017-01-26 NOTE — Plan of Care (Signed)
Problem: [facility] Participation in Recreation Therapeutic Interventions Goal: STG-Patient will demonstrate improved self esteem by identif STG: Self-Esteem - Within 4 treatment sessions, patient will verbalize at least 5 positive affirmation statements in each of 2 treatment sessions to increase self-esteem.  Outcome: Completed/Met Date Met: 01/26/17 Treatment Session 2; Completed 2 out of 2: At approximately 8:20 am, LRT met with patient in patient room. Patient verbalized 5 positive affirmation statements. Patient verbalized 5 positive affirmation statements. Patient reported it felt "good". LRT encouraged patient to continue saying positive affirmation statements.  Leonette Monarch, LRT/CTRS 05.04.18 11:45 am Goal: STG-Other Recreation Therapy Goal (Specify) STG: Stress Management - Within 4 treatment sessions, patient will verbalize understanding of the stress management techniques in each 2 treatment sessions to increase stress management skills.  Outcome: Completed/Met Date Met: 01/26/17 Treatment Session 2; Completed 2 out of 2: At approximately 8:20 am, LRT met with patient in patient room. Patient reported she read over and practiced the stress management techniques. Patient verbalized understanding and reported the techniques were helpful. LRT encouraged patient to continue practicing the stress management techniques.  Leonette Monarch, LRT/CTRS 05.04.18 11:46 am

## 2017-02-20 ENCOUNTER — Other Ambulatory Visit: Payer: Self-pay | Admitting: Gastroenterology

## 2017-03-05 ENCOUNTER — Other Ambulatory Visit: Payer: Self-pay

## 2017-03-05 MED ORDER — DICYCLOMINE HCL 10 MG PO CAPS: ORAL_CAPSULE | ORAL | 0 refills | Status: DC

## 2017-03-10 ENCOUNTER — Encounter: Payer: Self-pay | Admitting: Emergency Medicine

## 2017-03-10 ENCOUNTER — Emergency Department
Admission: EM | Admit: 2017-03-10 | Discharge: 2017-03-10 | Disposition: A | Discharge status: Home / Self Care | Payer: Medicaid Other | Attending: Emergency Medicine | Admitting: Emergency Medicine

## 2017-03-10 DIAGNOSIS — F17210 Nicotine dependence, cigarettes, uncomplicated: Secondary | ICD-10-CM | POA: Diagnosis not present

## 2017-03-10 DIAGNOSIS — Z79899 Other long term (current) drug therapy: Secondary | ICD-10-CM | POA: Diagnosis not present

## 2017-03-10 DIAGNOSIS — F331 Major depressive disorder, recurrent, moderate: Secondary | ICD-10-CM | POA: Insufficient documentation

## 2017-03-10 DIAGNOSIS — F329 Major depressive disorder, single episode, unspecified: Secondary | ICD-10-CM | POA: Diagnosis present

## 2017-03-10 LAB — CBC
HEMATOCRIT: 37.9 % (ref 35.0–47.0)
HEMOGLOBIN: 12.8 g/dL (ref 12.0–16.0)
MCH: 27.7 pg (ref 26.0–34.0)
MCHC: 33.8 g/dL (ref 32.0–36.0)
MCV: 82 fL (ref 80.0–100.0)
Platelets: 191 10*3/uL (ref 150–440)
RBC: 4.62 MIL/uL (ref 3.80–5.20)
RDW: 13.4 % (ref 11.5–14.5)
WBC: 5.8 10*3/uL (ref 3.6–11.0)

## 2017-03-10 LAB — URINE DRUG SCREEN, QUALITATIVE (ARMC ONLY)
Amphetamines, Ur Screen: NOT DETECTED
BARBITURATES, UR SCREEN: NOT DETECTED
Benzodiazepine, Ur Scrn: POSITIVE — AB
COCAINE METABOLITE, UR ~~LOC~~: NOT DETECTED
Cannabinoid 50 Ng, Ur ~~LOC~~: NOT DETECTED
MDMA (Ecstasy)Ur Screen: NOT DETECTED
METHADONE SCREEN, URINE: NOT DETECTED
OPIATE, UR SCREEN: NOT DETECTED
PHENCYCLIDINE (PCP) UR S: NOT DETECTED
Tricyclic, Ur Screen: NOT DETECTED

## 2017-03-10 LAB — COMPREHENSIVE METABOLIC PANEL
ALBUMIN: 4.1 g/dL (ref 3.5–5.0)
ALK PHOS: 72 U/L (ref 38–126)
ALT: 13 U/L — AB (ref 14–54)
ANION GAP: 5 (ref 5–15)
AST: 21 U/L (ref 15–41)
BILIRUBIN TOTAL: 0.3 mg/dL (ref 0.3–1.2)
BUN: 9 mg/dL (ref 6–20)
CALCIUM: 9.3 mg/dL (ref 8.9–10.3)
CO2: 27 mmol/L (ref 22–32)
CREATININE: 0.51 mg/dL (ref 0.44–1.00)
Chloride: 107 mmol/L (ref 101–111)
GFR calc Af Amer: >60 mL/min (ref >60)
GFR calc non Af Amer: >60 mL/min (ref >60)
GLUCOSE: 87 mg/dL (ref 65–99)
Potassium: 3.3 mmol/L — ABNORMAL LOW (ref 3.5–5.1)
SODIUM: 139 mmol/L (ref 135–145)
TOTAL PROTEIN: 7.1 g/dL (ref 6.5–8.1)

## 2017-03-10 LAB — PREGNANCY, URINE: PREG TEST UR: NEGATIVE

## 2017-03-10 LAB — SALICYLATE LEVEL: Salicylate Lvl: <7 mg/dL (ref 2.8–30.0)

## 2017-03-10 LAB — ETHANOL: Alcohol, Ethyl (B): 53 mg/dL — ABNORMAL HIGH (ref <5)

## 2017-03-10 LAB — ACETAMINOPHEN LEVEL

## 2017-03-10 MED ORDER — PROCHLORPERAZINE MALEATE 10 MG PO TABS
10.0000 mg | ORAL_TABLET | Freq: Once | ORAL | Status: AC
  Administered 2017-03-10: 10 mg via ORAL
  Filled 2017-03-10: qty 1

## 2017-03-10 MED ORDER — BUPROPION HCL ER (XL) 150 MG PO TB24: 150.0000 mg | ORAL_TABLET | Freq: Every day | ORAL | 0 refills | Status: DC

## 2017-03-10 MED ORDER — LORAZEPAM 1 MG PO TABS
1.0000 mg | ORAL_TABLET | Freq: Once | ORAL | Status: AC
  Administered 2017-03-10: 1 mg via ORAL
  Filled 2017-03-10: qty 1

## 2017-03-10 NOTE — BH Assessment (Signed)
Assessment Note  Vicki Dominguez is an 31 y.o. female, with a history of depression and anxiety disorder, presenting to the ED with concerns of worsening depression and suicidal ideations with no plan or intent.  Pt says that she has been experiencing worsening symptoms of depression over the past two week.  She reports sleeping about 12 hours a day and isolating herself herself from others.  She says she has no interest in interacting with others .  She also reports feeling fatigued the majority of the time. Pt also reports "feeling on edge" and believes she needs her medications adjusted.  Pt currently denies SI/HI and any auditory/visual hallucinations.  The denies current drug/alcohol use.  Diagnosis: Depression  Past Medical History:  Past Medical History:  Diagnosis Date  . Anxiety   . Arthritis    hands, legs  . Colitis   . Depression   . Family history of adverse reaction to anesthesia    Mother difficult to awaken after Succinycholine  . GERD (gastroesophageal reflux disease)   . Migraine    Rare.  Tension HA 3x/wk  . Ovarian cyst   . TMJ arthritis   . TMJ syndrome     Past Surgical History:  Procedure Laterality Date  . COLONOSCOPY WITH PROPOFOL N/A 07/03/2016   Procedure: COLONOSCOPY WITH PROPOFOL;  Surgeon: Midge Minium, MD;  Location: [facility];  Service: Endoscopy;  Laterality: N/A;  . ESOPHAGOGASTRODUODENOSCOPY N/A 03/06/2016   Procedure: ESOPHAGOGASTRODUODENOSCOPY (EGD);  Surgeon: Scot Jun, MD;  Location: [facility];  Service: Endoscopy;  Laterality: N/A;  . LAPAROSCOPIC OVARIAN CYSTECTOMY Left 03/07/2016   Procedure: LAPAROSCOPIC OVARIAN CYSTECTOMY;  Surgeon: Conard Novak, MD;  Location: [facility] ORS;  Service: Gynecology;  Laterality: Left;  . OVARIAN CYST REMOVAL    . TEMPOROMANDIBULAR JOINT SURGERY      Family History:  Family History  Problem Relation Age of Onset  . Heart attack Mother   . Hypertension Father     Social History:   reports that she has been smoking Cigarettes.  She has a 5.00 pack-year smoking history. She has never used smokeless tobacco. She reports that she does not drink alcohol or use drugs.  Additional Social History:  Alcohol / Drug Use Pain Medications: See PTA Prescriptions: See PTA Over the Counter: See PTA History of alcohol / drug use?: No history of alcohol / drug abuse  CIWA: CIWA-Ar BP: (!) 87/49 Pulse Rate: 81 COWS:    Allergies:  Allergies  Allergen Reactions  . 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 Tape Rash    Adhesive on nicotene patches     Home Medications:  (Not in a hospital admission)  OB/GYN Status:  Patient's last menstrual period was 02/16/2017 (approximate).  General Assessment Data Location of Assessment: [facility] ED TTS Assessment: In system Is this a Tele or Face-to-Face Assessment?: Face-to-Face Is this an Initial Assessment or a Re-assessment for this encounter?: Initial Assessment Marital status: Single Maiden name: na Is patient pregnant?: No Pregnancy Status: No Living Arrangements: Children Can pt return to current living arrangement?: Yes Admission Status: Voluntary Is patient capable of signing voluntary admission?: Yes Referral Source: Self/Family/Friend Insurance type: Medicaid     Crisis Care Plan Living Arrangements: Children Legal Guardian: Other: (self) Name of Psychiatrist: Dr. Janeece Riggers [facility]) Name of Therapist: None  Education Status Is patient currently in school?: No Current Grade: na Highest grade  of school patient has completed: 12th [place] of [employer] [employer] Contact person: n/a  Risk to self with the past 6 months Suicidal Ideation: Yes-Currently Present Has patient been a risk to self within the past 6 months prior to admission? : No Suicidal Intent: No Has patient had any suicidal intent within the  past 6 months prior to admission? : No Is patient at risk for suicide?: No Suicidal Plan?: No Has patient had any suicidal plan within the past 6 months prior to admission? : No Access to Means: No What has been your use of drugs/alcohol within the last 12 months?: occasional use of alcohol Previous Attempts/Gestures: No How many times?: 0 Other Self Harm Risks: pt denies Triggers for Past Attempts: None known Intentional Self Injurious Behavior: None Family Suicide History: No Recent stressful life event(s): Turmoil (Comment) Persecutory voices/beliefs?: No Depression: Yes Depression Symptoms: Isolating, Loss of interest in usual pleasures, Feeling worthless/self pity Substance abuse history and/or treatment for substance abuse?: No Suicide prevention information given to non-admitted patients: Not applicable  Risk to Others within the past 6 months Homicidal Ideation: No Does patient have any lifetime risk of violence toward others beyond the six months prior to admission? : No Thoughts of Harm to Others: No Current Homicidal Intent: No Current Homicidal Plan: No Access to Homicidal Means: No Identified Victim: None identified History of harm to others?: No Assessment of Violence: None Noted Violent Behavior Description: None identified Does patient have access to weapons?: No Criminal Charges Pending?: No Does patient have a court date: No Is patient on probation?: No  Psychosis Hallucinations: None noted Delusions: None noted  Mental Status Report Appearance/Hygiene: In scrubs Eye Contact: Fair Motor Activity: Freedom of movement Speech: Logical/coherent Level of Consciousness: Alert Mood: Depressed Affect: Flat Anxiety Level: Minimal Thought Processes: Coherent Judgement: Unimpaired Orientation: Place, Time, Person, Situation Obsessive Compulsive Thoughts/Behaviors: None  Cognitive Functioning Concentration: Normal Memory: Recent Intact, Remote Intact IQ:  Average Insight: Good Impulse Control: Good Appetite: Poor Sleep: Increased Total Hours of Sleep: 12 Vegetative Symptoms: Staying in bed  ADLScreening [facility] Assessment Services) Patient's cognitive ability adequate to safely complete daily activities?: Yes Patient able to express need for assistance with ADLs?: Yes Independently performs ADLs?: Yes (appropriate for developmental age)  Prior Inpatient Therapy Prior Inpatient Therapy: No Prior Therapy Dates: N/a Prior Therapy Facilty/Provider(s): N/a Reason for Treatment: N/a  Prior Outpatient Therapy Prior Outpatient Therapy: Yes Prior Therapy Dates: Current Prior Therapy Facilty/Provider(s): Dr. Janeece RiggersSu Reason for Treatment: Depression Does patient have an ACCT team?: No Does patient have Intensive In-House Services?  : No Does patient have [facility] services? : No Does patient have P4CC services?: No  ADL Screening (condition at time of admission) Patient's cognitive ability adequate to safely complete daily activities?: Yes Patient able to express need for assistance with ADLs?: Yes Independently performs ADLs?: Yes (appropriate for developmental age)       Abuse/Neglect Assessment (Assessment to be complete while patient is alone) Physical Abuse: Denies Verbal Abuse: Denies Sexual Abuse: Denies Exploitation of patient/patient's resources: Denies Self-Neglect: Denies Values / Beliefs Cultural Requests During Hospitalization: None Spiritual Requests During Hospitalization: None Consults Spiritual Care Consult Needed: No Social Work Consult Needed: No Merchant navy officerAdvance Directives (For Healthcare) Does Patient Have a Medical Advance Directive?: No Would patient like information on creating a medical advance directive?: No - Patient declined    Additional Information 1:1 In Past 12 Months?: No CIRT Risk: No Elopement Risk: No Does patient have medical clearance?: Yes  Disposition:  Disposition Initial Assessment  Completed for this Encounter: Yes Disposition of Patient: Other dispositions Other disposition(s): Other (Comment) (Pending [facility] consult)  On Site Evaluation by:   Reviewed with Physician:    [place] 03/10/2017 6:39 AM

## 2017-03-10 NOTE — ED Triage Notes (Signed)
Pt ambulatory to triage with no difficulty. Pt reports she came to the ER tonight because over the last 1 to 2 weeks she has been having increasing depression. Pt reports she just got tired of trying to deal with it. Pt states she has thoughts of cutting herself but denies cutting tonight. Pt also denies etoh or drug use.

## 2017-03-10 NOTE — ED Notes (Signed)
[facility]

## 2017-03-10 NOTE — Discharge Instructions (Signed)
1. Start Wellbutrin XR 150mg  daily in the morning (#14). 2. Return to the ER for worsening symptoms, feelings of hurting yourself or others, or other concerns.

## 2017-03-10 NOTE — ED Notes (Signed)
AAOx3.  Skin warm and dry.  Calm and cooperative.  NAD 

## 2017-03-10 NOTE — ED Provider Notes (Signed)
[facility] Emergency Department Provider Note   ____________________________________________   First MD Initiated Contact with Patient 03/10/17 0327     (approximate)  I have reviewed the triage vital signs and the nursing notes.   HISTORY  Chief Complaint Depression and Medical Clearance    HPI Vicki Dominguez is a 31 y.o. female who presents to the ED from home with a chief complaint of depression. Patient has a history of depression and states over the past 2 weeks she has been having increased symptoms of isolating herself from friends and family, feeling more depressed than usual. Has had thoughts of hurting herself but denies self-harm. Denies active SI/HI/AH/VH. Voices no medical complaints other than headache secondary to crying. Denies recent fever, chills, neck pain, vision changes, chest pain, shortness of breath, abdominal pain, nausea, vomiting. Denies recent travel or trauma. Nothing makes her symptoms better or worse.   Past Medical History:  Diagnosis Date  . Anxiety   . Arthritis    hands, legs  . Colitis   . Depression   . Family history of adverse reaction to anesthesia    Mother difficult to awaken after Succinycholine  . GERD (gastroesophageal reflux disease)   . Migraine    Rare.  Tension HA 3x/wk  . Ovarian cyst   . TMJ arthritis   . TMJ syndrome     Patient Active Problem List   Diagnosis Date Noted  . Opioid use disorder, severe, dependence (HCC) 01/24/2017  . Tobacco use disorder 01/24/2017  . Cannabis use disorder, moderate, dependence (HCC) 01/24/2017  . Severe recurrent major depression without psychotic features (HCC) 01/23/2017  . GERD (gastroesophageal reflux disease) 02/28/2016    Past Surgical History:  Procedure Laterality Date  . COLONOSCOPY WITH PROPOFOL N/A 07/03/2016   Procedure: COLONOSCOPY WITH PROPOFOL;  Surgeon: Midge Miniumarren Wohl, MD;  [facility];  Service: Endoscopy;  Laterality:  N/A;  . ESOPHAGOGASTRODUODENOSCOPY N/A 03/06/2016   Procedure: ESOPHAGOGASTRODUODENOSCOPY (EGD);  Surgeon: Scot Junobert T Elliott, MD;  [facility];  Service: Endoscopy;  Laterality: N/A;  . LAPAROSCOPIC OVARIAN CYSTECTOMY Left 03/07/2016   Procedure: LAPAROSCOPIC OVARIAN CYSTECTOMY;  Surgeon: Conard NovakStephen D Jackson, MD;  Location: [facility] ORS;  Service: Gynecology;  Laterality: Left;  . OVARIAN CYST REMOVAL    . TEMPOROMANDIBULAR JOINT SURGERY      Prior to Admission medications   Medication Sig Start Date End Date Taking? Authorizing Provider  buPROPion (WELLBUTRIN XL) 150 MG 24 hr tablet Take 1 tablet (150 mg total) by mouth daily. 03/10/17   Irean HongSung, Leamon Palau J, MD  dicyclomine (BENTYL) 10 MG capsule Take 2 capsules (20 mg total) by mouth every 6 (six) hours 03/05/17   Midge MiniumWohl, Darren, MD  gabapentin (NEURONTIN) 300 MG capsule Take 1 capsule (300 mg total) by mouth 3 (three) times daily. 01/25/17   Jimmy FootmanHernandez-Gonzalez, Andrea, MD  pantoprazole (PROTONIX) 40 MG tablet Take 1 tablet (40 mg total) by mouth daily. 01/26/17   Jimmy FootmanHernandez-Gonzalez, Andrea, MD  sertraline (ZOLOFT) 50 MG tablet Take 1 tablet (50 mg total) by mouth daily. 01/26/17   Jimmy FootmanHernandez-Gonzalez, Andrea, MD  traZODone (DESYREL) 50 MG tablet Take 1 tablet (50 mg total) by mouth at bedtime as needed for sleep. 01/25/17   Jimmy FootmanHernandez-Gonzalez, Andrea, MD    Allergies Succinylcholine chloride and Tape  Family History  Problem Relation Age of Onset  . Heart attack Mother   . Hypertension Father     Social History Social History  Substance Use Topics  . Smoking status:  Current Every Day Smoker    Packs/day: 0.50    Years: 10.00    Types: Cigarettes  . Smokeless tobacco: Never Used  . Alcohol use No     Comment: occasionally    Review of Systems  Constitutional: No fever/chills. Eyes: No visual changes. ENT: No sore throat. Cardiovascular: Denies chest pain. Respiratory: Denies shortness of breath. Gastrointestinal: No abdominal pain.   No nausea, no vomiting.  No diarrhea.  No constipation. Genitourinary: Negative for dysuria. Musculoskeletal: Negative for back pain. Skin: Negative for rash. Neurological: Positive for headache. Negative for focal weakness or numbness. Psychiatric:Positive for depression.  ____________________________________________   PHYSICAL EXAM:  VITAL SIGNS: ED Triage Vitals  Enc Vitals Group     BP 03/10/17 0144 100/66     Pulse Rate 03/10/17 0144 98     Resp 03/10/17 0144 20     Temp 03/10/17 0144 98.4 F (36.9 C)     Temp Source 03/10/17 0144 Oral     SpO2 03/10/17 0144 100 %     Weight 03/10/17 0148 130 lb (59 kg)     Height 03/10/17 0148 5\' 2"  (1.575 m)     Head Circumference --      Peak Flow --      Pain Score 03/10/17 0209 7     Pain Loc --      Pain Edu? --      Excl. in GC? --     Constitutional: Alert and oriented. Well appearing and in no acute distress. Eyes: Conjunctivae are normal. PERRL. EOMI. Head: Atraumatic. Nose: No congestion/rhinnorhea. Mouth/Throat: Mucous membranes are moist.  Oropharynx non-erythematous. Neck: No stridor.  Supple neck without meningismus. No carotid bruits. Cardiovascular: Normal rate, regular rhythm. Grossly normal heart sounds.  Good peripheral circulation. Respiratory: Normal respiratory effort.  No retractions. Lungs CTAB. Gastrointestinal: Soft and nontender. No distention. No abdominal bruits. No CVA tenderness. Musculoskeletal: No lower extremity tenderness nor edema.  No joint effusions. Neurologic:  Normal speech and language. No gross focal neurologic deficits are appreciated. No gait instability. Skin:  Skin is warm, dry and intact. No rash noted. No petechiae. Psychiatric: Mood and affect are flat. Speech and behavior are normal.  ____________________________________________   LABS (all labs ordered are listed, but only abnormal results are displayed)  Labs Reviewed  COMPREHENSIVE METABOLIC PANEL - Abnormal; Notable for  the following:       Result Value   Potassium 3.3 (*)    ALT 13 (*)    All other components within normal limits  ETHANOL - Abnormal; Notable for the following:    Alcohol, Ethyl (B) 53 (*)    All other components within normal limits  ACETAMINOPHEN LEVEL - Abnormal; Notable for the following:    Acetaminophen (Tylenol), Serum <10 (*)    All other components within normal limits  URINE DRUG SCREEN, QUALITATIVE ([facility] ONLY) - Abnormal; Notable for the following:    Benzodiazepine, Ur Scrn POSITIVE (*)    All other components within normal limits  SALICYLATE LEVEL  CBC  PREGNANCY, URINE   ____________________________________________  EKG  None ____________________________________________  RADIOLOGY  No results found.  ____________________________________________   PROCEDURES  Procedure(s) performed: None  Procedures  Critical Care performed: No  ____________________________________________   INITIAL IMPRESSION / ASSESSMENT AND PLAN / ED COURSE  Pertinent labs & imaging results that were available during my care of the patient were reviewed by me and considered in my medical decision making (see chart for details).  31 year old female with  a history of depression who presents with worsening symptoms. Denies active suicidal ideation. Contracts for safety in the emergency department. Patient will remain under voluntary status pending TTS and [facility] psychiatry evaluations.  Clinical Course as of Mar 10 709  Sat Mar 10, 2017  0709 Patient was seen by [facility] psychiatrist Dr. Morene Rankins, whom I spoke with over the telephone. He feels that patient does not meet criteria for involuntary commitment and she is psychiatrically stable for discharge home. Recommends initiation of Wellbutrin 150 mg daily #14, and outpatient mental health follow-up. Strict return precautions given. Patient verbalizes understanding and agrees with plan of care.  [JS]    Clinical Course User Index [JS]  Irean Hong, MD     ____________________________________________   FINAL CLINICAL IMPRESSION(S) / ED DIAGNOSES  Final diagnoses:  Moderate episode of recurrent major depressive disorder (HCC)      NEW MEDICATIONS STARTED DURING THIS VISIT:  New Prescriptions   BUPROPION (WELLBUTRIN XL) 150 MG 24 HR TABLET    Take 1 tablet (150 mg total) by mouth daily.     Note:  This document was prepared using Dragon voice recognition software and may include unintentional dictation errors.    Irean Hong, MD 03/10/17 [id number]

## 2017-04-16 ENCOUNTER — Encounter: Payer: Self-pay | Admitting: *Deleted

## 2017-04-16 ENCOUNTER — Emergency Department: Payer: Medicaid Other

## 2017-04-16 ENCOUNTER — Emergency Department
Admission: EM | Admit: 2017-04-16 | Discharge: 2017-04-16 | Disposition: A | Discharge status: Home / Self Care | Payer: Medicaid Other | Attending: Emergency Medicine | Admitting: Emergency Medicine

## 2017-04-16 DIAGNOSIS — Z79899 Other long term (current) drug therapy: Secondary | ICD-10-CM | POA: Diagnosis not present

## 2017-04-16 DIAGNOSIS — F17210 Nicotine dependence, cigarettes, uncomplicated: Secondary | ICD-10-CM | POA: Insufficient documentation

## 2017-04-16 DIAGNOSIS — R1030 Lower abdominal pain, unspecified: Secondary | ICD-10-CM | POA: Diagnosis not present

## 2017-04-16 DIAGNOSIS — N760 Acute vaginitis: Secondary | ICD-10-CM | POA: Diagnosis not present

## 2017-04-16 DIAGNOSIS — B9689 Other specified bacterial agents as the cause of diseases classified elsewhere: Secondary | ICD-10-CM

## 2017-04-16 DIAGNOSIS — R102 Pelvic and perineal pain: Secondary | ICD-10-CM

## 2017-04-16 DIAGNOSIS — R300 Dysuria: Secondary | ICD-10-CM | POA: Diagnosis present

## 2017-04-16 LAB — CBC
HCT: 39.3 % (ref 35.0–47.0)
Hemoglobin: 13 g/dL (ref 12.0–16.0)
MCH: 27.6 pg (ref 26.0–34.0)
MCHC: 33 g/dL (ref 32.0–36.0)
MCV: 83.5 fL (ref 80.0–100.0)
Platelets: 183 10*3/uL (ref 150–440)
RBC: 4.71 MIL/uL (ref 3.80–5.20)
RDW: 14.9 % — ABNORMAL HIGH (ref 11.5–14.5)
WBC: 5.4 10*3/uL (ref 3.6–11.0)

## 2017-04-16 LAB — WET PREP, GENITAL
Sperm: NONE SEEN
TRICH WET PREP: NONE SEEN
Yeast Wet Prep HPF POC: NONE SEEN

## 2017-04-16 LAB — BASIC METABOLIC PANEL
Anion gap: 8 (ref 5–15)
BUN: 10 mg/dL (ref 6–20)
CHLORIDE: 109 mmol/L (ref 101–111)
CO2: 23 mmol/L (ref 22–32)
Calcium: 8.7 mg/dL — ABNORMAL LOW (ref 8.9–10.3)
Creatinine, Ser: 0.6 mg/dL (ref 0.44–1.00)
GFR calc Af Amer: >60 mL/min (ref >60)
GFR calc non Af Amer: >60 mL/min (ref >60)
GLUCOSE: 95 mg/dL (ref 65–99)
POTASSIUM: 2.6 mmol/L — AB (ref 3.5–5.1)
Sodium: 140 mmol/L (ref 135–145)

## 2017-04-16 LAB — URINALYSIS, COMPLETE (UACMP) WITH MICROSCOPIC
Bacteria, UA: NONE SEEN
Bilirubin Urine: NEGATIVE
Glucose, UA: NEGATIVE mg/dL
Hgb urine dipstick: NEGATIVE
Ketones, ur: 5 mg/dL — AB
Leukocytes, UA: NEGATIVE
Nitrite: NEGATIVE
PH: 6 (ref 5.0–8.0)
Protein, ur: NEGATIVE mg/dL
SPECIFIC GRAVITY, URINE: 1.013 (ref 1.005–1.030)

## 2017-04-16 LAB — CHLAMYDIA/NGC RT PCR (ARMC ONLY)
Chlamydia Tr: NOT DETECTED
N gonorrhoeae: NOT DETECTED

## 2017-04-16 LAB — POCT PREGNANCY, URINE: PREG TEST UR: NEGATIVE

## 2017-04-16 MED ORDER — KETOROLAC TROMETHAMINE 30 MG/ML IJ SOLN
30.0000 mg | Freq: Once | INTRAMUSCULAR | Status: AC
  Administered 2017-04-16: 30 mg via INTRAVENOUS
  Filled 2017-04-16: qty 1

## 2017-04-16 MED ORDER — METRONIDAZOLE 500 MG PO TABS
500.0000 mg | ORAL_TABLET | Freq: Two times a day (BID) | ORAL | 0 refills | Status: DC
Start: 2017-04-16 — End: 2020-10-04

## 2017-04-16 MED ORDER — TRAMADOL HCL 50 MG PO TABS: 50.0000 mg | ORAL_TABLET | Freq: Four times a day (QID) | ORAL | 0 refills | Status: DC | PRN

## 2017-04-16 MED ORDER — MORPHINE SULFATE (PF) 4 MG/ML IV SOLN
4.0000 mg | Freq: Once | INTRAVENOUS | Status: AC
  Administered 2017-04-16: 4 mg via INTRAVENOUS
  Filled 2017-04-16: qty 1

## 2017-04-16 MED ORDER — MORPHINE SULFATE (PF) 4 MG/ML IV SOLN: 4.0000 mg | Freq: Once | INTRAVENOUS | Status: DC

## 2017-04-16 MED ORDER — ONDANSETRON HCL 4 MG/2ML IJ SOLN
4.0000 mg | Freq: Once | INTRAMUSCULAR | Status: AC
  Administered 2017-04-16: 4 mg via INTRAVENOUS
  Filled 2017-04-16: qty 2

## 2017-04-16 NOTE — ED Notes (Signed)
Pt did not feel that she could urinate right now. Pt advised she would wait till she got to her room.

## 2017-04-16 NOTE — ED Provider Notes (Signed)
[facility] Emergency Department Provider Note  Time seen: 6:15 PM  I have reviewed the triage vital signs and the nursing notes.   HISTORY  Chief Complaint Dysuria and Muscle Pain    HPI Vicki Dominguez is a 31 y.o. female with a past medical history of ulcerative colitis, recurrent urinary tract infections, who presents to the emergency department for lower abdominal discomfort. According to the patient over the past 3 weeks she has been experiencing lower abdominal discomfort with dysuria and vaginal discharge. [facility]. Has undergone 2 courses of antibiotics over the past 3 weeks but states she continues to have discomfort.[facility] with a urinary tract infection, nitrite positive urine. Patient was given IV Rocephin and discharged with ciprofloxacin. Patient states continued lower abdominal/pelvic pain, now with vaginal discharge. States the fever to 101 today per patient. She is afebrile in the emergency department. Denies any nausea vomiting or diarrhea. States her last period was approximately 3 weeks ago.  Past Medical History:  Diagnosis Date  . Anxiety   . Arthritis    hands, legs  . Colitis   . Depression   . Family history of adverse reaction to anesthesia    Mother difficult to awaken after Succinycholine  . GERD (gastroesophageal reflux disease)   . Migraine    Rare.  Tension HA 3x/wk  . Ovarian cyst   . TMJ arthritis   . TMJ syndrome     Patient Active Problem List   Diagnosis Date Noted  . Opioid use disorder, severe, dependence (HCC) 01/24/2017  . Tobacco use disorder 01/24/2017  . Cannabis use disorder, moderate, dependence (HCC) 01/24/2017  . Severe recurrent major depression without psychotic features (HCC) 01/23/2017  . GERD (gastroesophageal reflux disease) 02/28/2016    Past Surgical History:  Procedure Laterality Date  . COLONOSCOPY WITH PROPOFOL N/A 07/03/2016    Procedure: COLONOSCOPY WITH PROPOFOL;  Surgeon: Midge Miniumarren Wohl, MD;  [facility];  Service: Endoscopy;  Laterality: N/A;  . ESOPHAGOGASTRODUODENOSCOPY N/A 03/06/2016   Procedure: ESOPHAGOGASTRODUODENOSCOPY (EGD);  Surgeon: Scot Junobert T Elliott, MD;  [facility];  Service: Endoscopy;  Laterality: N/A;  . LAPAROSCOPIC OVARIAN CYSTECTOMY Left 03/07/2016   Procedure: LAPAROSCOPIC OVARIAN CYSTECTOMY;  Surgeon: Conard NovakStephen D Jackson, MD;  Location: [facility] ORS;  Service: Gynecology;  Laterality: Left;  . OVARIAN CYST REMOVAL    . TEMPOROMANDIBULAR JOINT SURGERY      Prior to Admission medications   Medication Sig Start Date End Date Taking? Authorizing Provider  buPROPion (WELLBUTRIN XL) 150 MG 24 hr tablet Take 1 tablet (150 mg total) by mouth daily. 03/10/17   Irean HongSung, Jade J, MD  dicyclomine (BENTYL) 10 MG capsule Take 2 capsules (20 mg total) by mouth every 6 (six) hours 03/05/17   Midge MiniumWohl, Darren, MD  gabapentin (NEURONTIN) 300 MG capsule Take 1 capsule (300 mg total) by mouth 3 (three) times daily. 01/25/17   Jimmy FootmanHernandez-Gonzalez, Andrea, MD  pantoprazole (PROTONIX) 40 MG tablet Take 1 tablet (40 mg total) by mouth daily. 01/26/17   Jimmy FootmanHernandez-Gonzalez, Andrea, MD  sertraline (ZOLOFT) 50 MG tablet Take 1 tablet (50 mg total) by mouth daily. 01/26/17   Jimmy FootmanHernandez-Gonzalez, Andrea, MD  traZODone (DESYREL) 50 MG tablet Take 1 tablet (50 mg total) by mouth at bedtime as needed for sleep. 01/25/17   Jimmy FootmanHernandez-Gonzalez, Andrea, MD    Allergies  Allergen Reactions  . 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 Tape Rash    Adhesive on nicotene patches     Family History  Problem Relation Age of Onset  . Heart attack Mother   . Hypertension Father     Social History Social History  Substance Use Topics  . Smoking status: Current Every Day Smoker    Packs/day: 0.50    Years: 10.00     Types: Cigarettes  . Smokeless tobacco: Never Used  . Alcohol use No     Comment: occasionally    Review of Systems Constitutional: Negative for fever. ENT: Negative for congestion Cardiovascular: Negative for chest pain. Respiratory: Negative for shortness of breath.Negative for cough. Gastrointestinal: Positive for lower abdominal pain 3 weeks. Negative for nausea vomiting or diarrhea. Genitourinary: Positive for intermittent dysuria over the past [redacted] weeks along with vaginal discharge. Musculoskeletal: Negative for back pain Neurological: Negative for headache All other ROS negative  ____________________________________________   PHYSICAL EXAM:  VITAL SIGNS: ED Triage Vitals  Enc Vitals Group     BP 04/16/17 1554 134/87     Pulse Rate 04/16/17 1554 96     Resp 04/16/17 1554 16     Temp 04/16/17 1554 99 F (37.2 C)     Temp Source 04/16/17 1554 Oral     SpO2 04/16/17 1554 97 %     Weight 04/16/17 1552 125 lb (56.7 kg)     Height 04/16/17 1552 5\' 2"  (1.575 m)     Head Circumference --      Peak Flow --      Pain Score 04/16/17 1552 8     Pain Loc --      Pain Edu? --      Excl. in GC? --     Constitutional: Alert and oriented. Well appearing and in no distress. Eyes: Normal exam ENT   Head: Normocephalic and atraumatic.   Mouth/Throat: Mucous membranes are moist. Cardiovascular: Normal rate, regular rhythm. No murmur Respiratory: Normal respiratory effort without tachypnea nor retractions. Breath sounds are clear  Gastrointestinal: Soft, moderate suprapubic tenderness to palpation. No rebound or guarding. No distention. Musculoskeletal: Nontender with normal range of motion in all extremities. Neurologic:  Normal speech and language. No gross focal neurologic deficits  Skin:  Skin is warm, dry and intact.  Psychiatric: Mood and affect are normal.   ____________________________________________     RADIOLOGY  Ultrasound shows hemorrhagic  cyst.   INITIAL IMPRESSION / ASSESSMENT AND PLAN / ED COURSE  Pertinent labs & imaging results that were available during my care of the patient were reviewed by me and considered in my medical decision making (see chart for details).  Patient presents to the emergency department for lower abdominal discomfort of the past [redacted] weeks along with dysuria and vaginal discharge. Patient states 2 courses of antibiotics as she continues to have intermittent dysuria. I reviewed the patient's records in care everywhere. Patient was discharged with ciprofloxacin on 04/08/17 from [facility]. At that time the patient had a nitrite-positive urine. Today's urinalysis is normal. Blood work is normal. Patient now states she is having vaginal discharge as well we'll perform a pelvic examination. We will send a urinalysis for urine culture however with a negative urinalysis we will hold off on any further antibiotics at this time. I have reviewed the patient's narcotic history she does have intermittent narcotic prescriptions filled and at one time appeared to be prescribed buprenorphine.  She does not appear to have any long-standing  prescriptions. We will treat her pain in the emergency department and likely discharge with a very short course of pain medicine.  Patient's workup shows positive for bacterial vaginitis, also sound positive for 3.9 cm left ovarian cyst. Discussed this with the patient and need follow-up with OB/GYN, she is agreeable. We will treat with Flagyl. Urine culture has been sent. We'll have the patient follow-up with her doctor. We will discharge with a very short course of pain medicine.  ____________________________________________   FINAL CLINICAL IMPRESSION(S) / ED DIAGNOSES  Lower abdominal pain Bacterial vaginitis   Minna Antis, MD 04/16/17 2100

## 2017-04-16 NOTE — ED Triage Notes (Signed)
Pt arrives via EMS from home, states recent UTI with Po and IV abx, states continued back pain and lower pelvic pain, also states hx of ovarian cysts, awake and alert in no acute distress

## 2017-04-16 NOTE — ED Notes (Signed)
Pelvic Cart set up at bedside. 

## 2017-04-16 NOTE — ED Notes (Signed)
Patient transported to Ultrasound 

## 2017-04-18 LAB — URINE CULTURE

## 2017-06-17 IMAGING — DX DG CHEST 1V PORT
1 series · 1 of 1 positions shown · non-contrast
Comparison: 05/03/2008

CLINICAL DATA: Tachycardia and chest pain after taking diet pill
yesterday.

EXAM:
PORTABLE CHEST 1 VIEW

[chest ap]
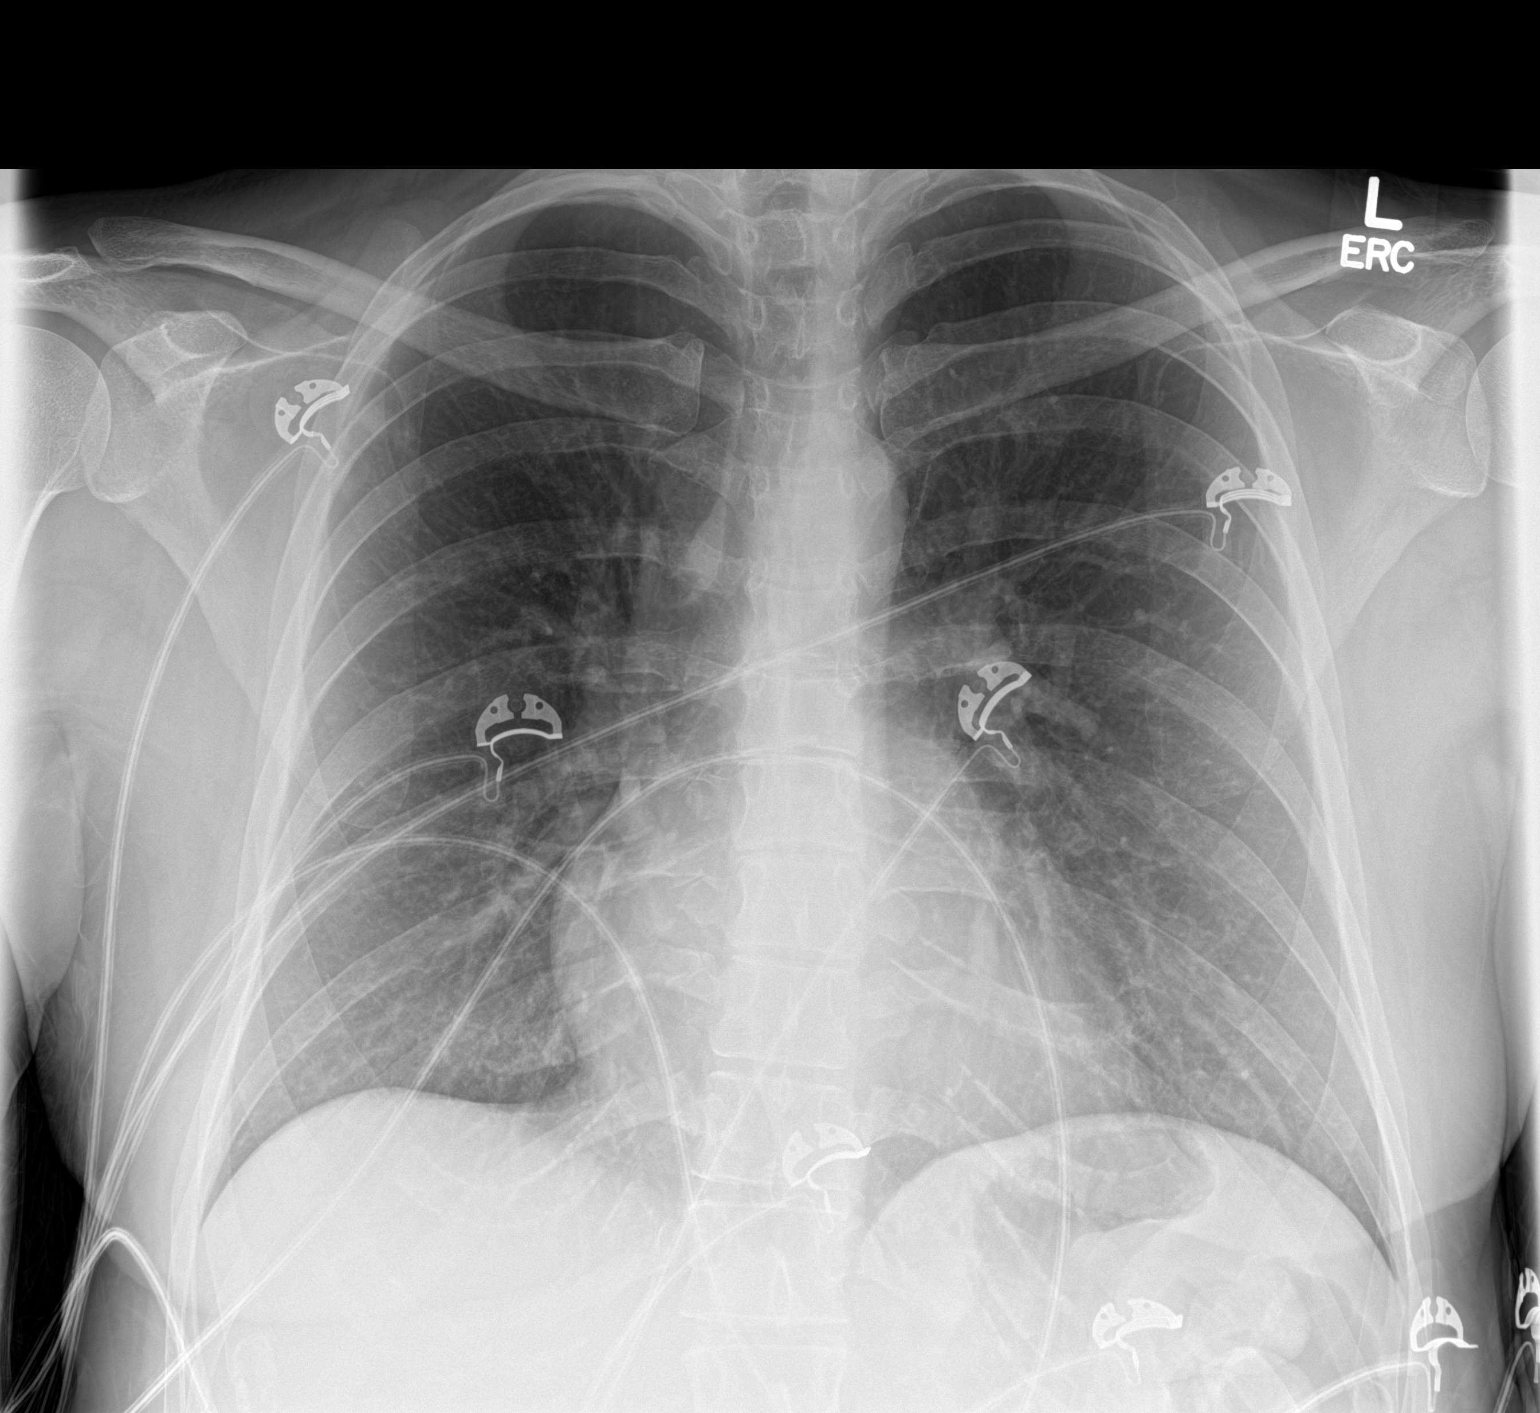

[1 of 1 positions shown; findings below may reference images not displayed]

FINDINGS: The heart size and mediastinal contours are within normal limits.
Both lungs are clear. The visualized skeletal structures are
unremarkable.
IMPRESSION: No active disease.

## 2017-08-03 ENCOUNTER — Other Ambulatory Visit: Payer: Self-pay | Admitting: Gastroenterology

## 2017-08-08 ENCOUNTER — Other Ambulatory Visit: Payer: Self-pay | Admitting: Gastroenterology

## 2017-08-26 ENCOUNTER — Other Ambulatory Visit: Payer: Self-pay

## 2017-08-26 ENCOUNTER — Encounter: Payer: Self-pay | Admitting: *Deleted

## 2017-08-26 ENCOUNTER — Ambulatory Visit
Admission: EM | Admit: 2017-08-26 | Discharge: 2017-08-26 | Disposition: A | Discharge status: Home / Self Care | Payer: Medicaid Other | Attending: Family Medicine | Admitting: Family Medicine

## 2017-08-26 DIAGNOSIS — K529 Noninfective gastroenteritis and colitis, unspecified: Secondary | ICD-10-CM | POA: Diagnosis not present

## 2017-08-26 DIAGNOSIS — Z8249 Family history of ischemic heart disease and other diseases of the circulatory system: Secondary | ICD-10-CM | POA: Diagnosis not present

## 2017-08-26 DIAGNOSIS — M1990 Unspecified osteoarthritis, unspecified site: Secondary | ICD-10-CM | POA: Insufficient documentation

## 2017-08-26 DIAGNOSIS — E876 Hypokalemia: Secondary | ICD-10-CM | POA: Diagnosis not present

## 2017-08-26 DIAGNOSIS — R339 Retention of urine, unspecified: Secondary | ICD-10-CM | POA: Diagnosis not present

## 2017-08-26 DIAGNOSIS — F17210 Nicotine dependence, cigarettes, uncomplicated: Secondary | ICD-10-CM | POA: Diagnosis not present

## 2017-08-26 DIAGNOSIS — M549 Dorsalgia, unspecified: Secondary | ICD-10-CM | POA: Diagnosis not present

## 2017-08-26 DIAGNOSIS — F329 Major depressive disorder, single episode, unspecified: Secondary | ICD-10-CM | POA: Insufficient documentation

## 2017-08-26 DIAGNOSIS — N920 Excessive and frequent menstruation with regular cycle: Secondary | ICD-10-CM | POA: Diagnosis not present

## 2017-08-26 DIAGNOSIS — R1030 Lower abdominal pain, unspecified: Secondary | ICD-10-CM | POA: Diagnosis not present

## 2017-08-26 DIAGNOSIS — K219 Gastro-esophageal reflux disease without esophagitis: Secondary | ICD-10-CM | POA: Insufficient documentation

## 2017-08-26 DIAGNOSIS — R300 Dysuria: Secondary | ICD-10-CM | POA: Diagnosis not present

## 2017-08-26 DIAGNOSIS — Z79899 Other long term (current) drug therapy: Secondary | ICD-10-CM | POA: Insufficient documentation

## 2017-08-26 DIAGNOSIS — R197 Diarrhea, unspecified: Secondary | ICD-10-CM | POA: Diagnosis present

## 2017-08-26 DIAGNOSIS — F419 Anxiety disorder, unspecified: Secondary | ICD-10-CM | POA: Insufficient documentation

## 2017-08-26 LAB — CBC WITH DIFFERENTIAL/PLATELET
BASOS ABS: 0 10*3/uL (ref 0–0.1)
BASOS PCT: 1 %
Eosinophils Absolute: 0.1 10*3/uL (ref 0–0.7)
Eosinophils Relative: 2 %
HEMATOCRIT: 42.7 % (ref 35.0–47.0)
HEMOGLOBIN: 14.1 g/dL (ref 12.0–16.0)
LYMPHS PCT: 32 %
Lymphs Abs: 1.6 10*3/uL (ref 1.0–3.6)
MCH: 27.8 pg (ref 26.0–34.0)
MCHC: 33.1 g/dL (ref 32.0–36.0)
MCV: 84.1 fL (ref 80.0–100.0)
Monocytes Absolute: 0.3 10*3/uL (ref 0.2–0.9)
Monocytes Relative: 6 %
NEUTROS ABS: 3 10*3/uL (ref 1.4–6.5)
NEUTROS PCT: 59 %
Platelets: 250 10*3/uL (ref 150–440)
RBC: 5.07 MIL/uL (ref 3.80–5.20)
RDW: 13.9 % (ref 11.5–14.5)
WBC: 5.1 10*3/uL (ref 3.6–11.0)

## 2017-08-26 LAB — URINALYSIS, COMPLETE (UACMP) WITH MICROSCOPIC
Bilirubin Urine: NEGATIVE
Glucose, UA: NEGATIVE mg/dL
KETONES UR: 15 mg/dL — AB
Leukocytes, UA: NEGATIVE
Nitrite: NEGATIVE
PH: 6 (ref 5.0–8.0)
PROTEIN: NEGATIVE mg/dL
Specific Gravity, Urine: 1.025 (ref 1.005–1.030)

## 2017-08-26 LAB — COMPREHENSIVE METABOLIC PANEL
ALBUMIN: 4.7 g/dL (ref 3.5–5.0)
ALK PHOS: 71 U/L (ref 38–126)
ALT: 12 U/L — AB (ref 14–54)
AST: 19 U/L (ref 15–41)
Anion gap: 7 (ref 5–15)
BUN: 16 mg/dL (ref 6–20)
CALCIUM: 9.3 mg/dL (ref 8.9–10.3)
CO2: 25 mmol/L (ref 22–32)
CREATININE: 0.69 mg/dL (ref 0.44–1.00)
Chloride: 108 mmol/L (ref 101–111)
GFR calc Af Amer: >60 mL/min (ref >60)
GFR calc non Af Amer: >60 mL/min (ref >60)
Glucose, Bld: 101 mg/dL — ABNORMAL HIGH (ref 65–99)
Potassium: 3.3 mmol/L — ABNORMAL LOW (ref 3.5–5.1)
Sodium: 140 mmol/L (ref 135–145)
TOTAL PROTEIN: 7.9 g/dL (ref 6.5–8.1)
Total Bilirubin: 0.4 mg/dL (ref 0.3–1.2)

## 2017-08-26 MED ORDER — POTASSIUM CHLORIDE CRYS ER 20 MEQ PO TBCR: 40.0000 meq | EXTENDED_RELEASE_TABLET | Freq: Two times a day (BID) | ORAL | 0 refills | Status: DC

## 2017-08-26 NOTE — ED Triage Notes (Signed)
Patient started having chronic diarrhea 1 month ago. Patient also reports painful urination.

## 2017-08-26 NOTE — Discharge Instructions (Signed)
We are replacing your potassium. It was slightly low.   Labs otherwise unremarkable. No anemia.  Please follow up with your PCP.  Take care  Dr. Adriana Simasook

## 2017-08-26 NOTE — ED Provider Notes (Signed)
[facility]    CSN: [id number] Arrival date & time: 08/26/17  1459  History   Chief Complaint Chief Complaint  Patient presents with  . Diarrhea  . Urinary Retention   HPI  31 year old female presents with diarrhea, fatigue, dysuria, prolonged menses.  Patient reports that she has had ongoing diarrhea.  She states it is been over a year.  States it is been worse over the past few weeks.  She states that every time she eats, she has watery diarrhea.  She reports associated lower abdominal pain.  Also reports back pain.  Additionally, patient has recently developed dysuria.  She is currently on her menstrual cycle also complains of her menstrual cycle does not seem to be seizing as it normally does.  She states that she is on her seventh day of bleeding.  She states that it continues to be heavy and does not seem to be lightening up.  Patient reports that given her constellation of symptoms, he decided to come in for evaluation.  No fever.  No known exacerbating or relieving factors.  No other reported symptoms.  Past Medical History:  Diagnosis Date  . Anxiety   . Arthritis    hands, legs  . Colitis   . Depression   . Family history of adverse reaction to anesthesia    Mother difficult to awaken after Succinycholine  . GERD (gastroesophageal reflux disease)   . Migraine    Rare.  Tension HA 3x/wk  . Ovarian cyst   . TMJ arthritis   . TMJ syndrome    Patient Active Problem List   Diagnosis Date Noted  . Opioid use disorder, severe, dependence ([place]) 01/24/2017  . Tobacco use disorder 01/24/2017  . Cannabis use disorder, moderate, dependence ([place]) 01/24/2017  . Severe recurrent major depression without psychotic features ([place]) 01/23/2017  . GERD (gastroesophageal reflux disease) 02/28/2016   Past Surgical History:  Procedure Laterality Date  . COLONOSCOPY WITH PROPOFOL N/A 07/03/2016   Procedure: COLONOSCOPY WITH PROPOFOL;  Surgeon: Lucilla Lame, MD;  Location:  [place];  Service: Endoscopy;  Laterality: N/A;  . ESOPHAGOGASTRODUODENOSCOPY N/A 03/06/2016   Procedure: ESOPHAGOGASTRODUODENOSCOPY (EGD);  Surgeon: Manya Silvas, MD;  Location: [facility] END[facility]COPY;  Service: Endoscopy;  Laterality: N/A;  . LAPAROSCOPIC OVARIAN CYSTECTOMY Left 03/07/2016   Procedure: LAPAROSCOPIC OVARIAN CYSTECTOMY;  Surgeon: Will Bonnet, MD;  Location: [facility] ORS;  Service: Gynecology;  Laterality: Left;  . OVARIAN CYST REMOVAL    . TEMPOROMANDIBULAR JOINT SURGERY     OB History    No data available     Home Medications    Prior to Admission medications   Medication Sig Start Date End Date Taking? Authorizing Provider  ALPRAZolam Duanne Moron) 1 MG tablet Take 1 mg by mouth 2 (two) times daily.   Yes [provider]  dicyclomine (BENTYL) 10 MG capsule Take 2 capsules (20 mg total) by mouth every 6 (six) hours 03/05/17  Yes Lucilla Lame, MD  pantoprazole (PROTONIX) 40 MG tablet Take 1 tablet (40 mg total) by mouth daily. 01/26/17  Yes Hildred Priest, MD  promethazine (PHENERGAN) 25 MG tablet Take 25 mg by mouth every 6 (six) hours as needed for nausea or vomiting.   Yes [provider]  potassium chloride SA (K-DUR,KLOR-CON) 20 MEQ tablet Take 2 tablets (40 mEq total) by mouth 2 (two) times daily. 08/26/17   Coral Spikes, DO    Family History Family History  Problem Relation Age of Onset  .  Heart attack Mother   . Hypertension Father     Social History Social History   Tobacco Use  . Smoking status: Current Every Day Smoker    Packs/day: 0.50    Years: 10.00    Pack years: 5.00    Types: Cigarettes  . Smokeless tobacco: Never Used  Substance Use Topics  . Alcohol use: No    Alcohol/week: 0.0 oz    Comment: occasionally  . Drug use: No     Allergies   Succinylcholine chloride and Tape   Review of Systems Review of Systems  Constitutional: Negative for fever.  Gastrointestinal: Positive for abdominal pain and  diarrhea.  Genitourinary: Positive for dysuria and vaginal bleeding.  All other systems reviewed and are negative.  Physical Exam Triage Vital Signs ED Triage Vitals  Enc Vitals Group     BP 08/26/17 1557 121/84     Pulse Rate 08/26/17 1557 98     Resp 08/26/17 1557 16     Temp 08/26/17 1557 98 F (36.7 C)     Temp Source 08/26/17 1557 Oral     SpO2 08/26/17 1557 100 %     Weight 08/26/17 1559 115 lb (52.2 kg)     Height 08/26/17 1559 '5\' 2"'  (1.575 m)     Head Circumference --      Peak Flow --      Pain Score 08/26/17 1600 7     Pain Loc --      Pain Edu? --      Excl. in [place]? --    Updated Vital Signs BP 121/84 (BP Location: Left Arm)   Pulse 98   Temp 98 F (36.7 C) (Oral)   Resp 16   Ht '5\' 2"'  (1.575 m)   Wt 115 lb (52.2 kg)   LMP 08/26/2017   SpO2 100%   BMI 21.03 kg/m   Physical Exam  Constitutional: She is oriented to person, place, and time.  Thin female no acute distress.  HENT:  Head: Normocephalic and atraumatic.  Mouth/Throat: Oropharynx is clear and moist.  Eyes: Conjunctivae are normal. No scleral icterus.  Cardiovascular: Normal rate and regular rhythm.  Soft systolic murmur.  Pulmonary/Chest: Effort normal and breath sounds normal. She has no wheezes. She has no rales.  Abdominal: Soft.  Nondistended.  No discrete areas of tenderness appreciated on exam.  No mass.  No rebound.  No guarding.  Neurological: She is alert and oriented to person, place, and time.  Psychiatric: Her behavior is normal.  Flat affect.  Depressed mood.  Vitals reviewed.  UC Treatments / Results  Labs (all labs ordered are listed, but only abnormal results are displayed) Labs Reviewed  URINALYSIS, COMPLETE (UACMP) WITH MICROSCOPIC - Abnormal; Notable for the following components:      Result Value   APPearance HAZY (*)    Hgb urine dipstick LARGE (*)    Ketones, ur 15 (*)    Squamous Epithelial / LPF 6-30 (*)    Bacteria, UA RARE (*)    All other components within  normal limits  COMPREHENSIVE METABOLIC PANEL - Abnormal; Notable for the following components:   Potassium 3.3 (*)    Glucose, Bld 101 (*)    ALT 12 (*)    All other components within normal limits  URINE CULTURE  CBC WITH DIFFERENTIAL/PLATELET    EKG  EKG Interpretation None       Radiology No results found.  Procedures Procedures (including critical care time)  Medications Ordered  in UC Medications - No data to display   Initial Impression / Assessment and Plan / UC Course  I have reviewed the triage vital signs and the nursing notes.  Pertinent labs & imaging results that were available during my care of the patient were reviewed by me and considered in my medical decision making (see chart for details).     31 year old female presents with a myriad of symptoms.  Has ongoing chronic diarrhea.  Metabolic panel revealed hypokalemia.  Repleting orally.  I informed her that she needs to see gastroenterology for her chronic diarrhea.  No evidence of acute abdomen.  Regarding her prolonged menses, no anemia was noted today.  Patient needs follow-up with primary care.  Final Clinical Impressions(s) / UC Diagnoses   Final diagnoses:  Chronic diarrhea  Menorrhagia with regular cycle  Hypokalemia    ED Discharge Orders        Ordered    potassium chloride SA (K-DUR,KLOR-CON) 20 MEQ tablet  2 times daily     08/26/17 1729     Controlled Substance Prescriptions [employer] Controlled Substance Registry consulted? Not Applicable   Coral Spikes, DO 08/26/17 1749

## 2017-08-28 LAB — URINE CULTURE

## 2017-08-30 ENCOUNTER — Telehealth: Payer: Self-pay | Admitting: Gastroenterology

## 2017-08-30 NOTE — Telephone Encounter (Signed)
Attempted to return patients call but no voice mail.

## 2017-08-31 ENCOUNTER — Other Ambulatory Visit: Payer: Self-pay | Admitting: Gastroenterology

## 2017-08-31 MED ORDER — DICYCLOMINE HCL 10 MG PO CAPS: ORAL_CAPSULE | ORAL | 0 refills | Status: DC

## 2017-09-26 ENCOUNTER — Other Ambulatory Visit: Payer: Self-pay

## 2017-09-26 ENCOUNTER — Ambulatory Visit (INDEPENDENT_AMBULATORY_CARE_PROVIDER_SITE_OTHER): Payer: Medicaid Other | Admitting: Gastroenterology

## 2017-09-26 ENCOUNTER — Encounter: Payer: Self-pay | Admitting: Gastroenterology

## 2017-09-26 VITALS — BP 131/81 | HR 95 | Ht 62.0 in | Wt 123.0 lb

## 2017-09-26 DIAGNOSIS — K580 Irritable bowel syndrome with diarrhea: Secondary | ICD-10-CM

## 2017-09-26 MED ORDER — DIPHENOXYLATE-ATROPINE 2.5-0.025 MG PO TABS: 1.0000 | ORAL_TABLET | Freq: Four times a day (QID) | ORAL | 5 refills | Status: DC | PRN

## 2017-09-26 NOTE — Progress Notes (Signed)
Primary Care Physician: [facility]  Primary Gastroenterologist:  Dr. Midge Minium  Chief Complaint  Patient presents with  . Follow up diarrhea    HPI: Vicki Dominguez is a 32 y.o. female here here for continued diarrhea. The patient reports that she has diarrhea and abdominal pain shortly after eating. The patient also reports that she has lost about 20 pounds in the last year. The patient had a colonoscopy and EGD in the past. The EGD was done by Dr. Mechele Collin and showed some gastritis and esophagitis. The colonoscopy done by me showed a normal-appearing terminal ileum and colon. The patient denies any rectal bleeding or episodes of constipation. She states that her abdominal pain is worse when she eats. The patient also reports that despite Protonix twice a day she is still having reflux symptoms.  Current Outpatient Medications  Medication Sig Dispense Refill  . ALPRAZolam (XANAX) 1 MG tablet Take 1 mg by mouth 2 (two) times daily.    Marland Kitchen dicyclomine (BENTYL) 10 MG capsule Take 2 capsules (20 mg total) by mouth every 6 (six) hours 30 capsule 0  . lamoTRIgine (LAMICTAL) 100 MG tablet   3  . Multiple Vitamin (MULTIVITAMIN) tablet Take by mouth.    . pantoprazole (PROTONIX) 40 MG tablet Take 1 tablet (40 mg total) by mouth daily. 30 tablet 0  . promethazine (PHENERGAN) 25 MG tablet Take 25 mg by mouth every 6 (six) hours as needed for nausea or vomiting.    . sucralfate (CARAFATE) 1 GM/10ML suspension Take by mouth.    Marland Kitchen VYVANSE 50 MG capsule   0  . diphenoxylate-atropine (LOMOTIL) 2.5-0.025 MG tablet Take 1 tablet by mouth 4 (four) times daily as needed for diarrhea or loose stools. 60 tablet 5   No current facility-administered medications for this visit.     Allergies as of 09/26/2017 - Review Complete 09/26/2017  Allergen Reaction Noted  . Succinylcholine chloride Other (See Comments) 03/06/2016  . Tape Rash 06/26/2016    ROS:  General: Negative for anorexia, weight  loss, fever, chills, fatigue, weakness. ENT: Negative for hoarseness, difficulty swallowing , nasal congestion. CV: Negative for chest pain, angina, palpitations, dyspnea on exertion, peripheral edema.  Respiratory: Negative for dyspnea at rest, dyspnea on exertion, cough, sputum, wheezing.  GI: See history of present illness. GU:  Negative for dysuria, hematuria, urinary incontinence, urinary frequency, nocturnal urination.  Endo: Negative for unusual weight change.    Physical Examination:   BP 131/81   Pulse 95   Ht 5\' 2"  (1.575 m)   Wt 123 lb (55.8 kg)   BMI 22.50 kg/m   General: Well-nourished, well-developed in no acute distress.  Eyes: No icterus. Conjunctivae pink. Mouth: Oropharyngeal mucosa moist and pink , no lesions erythema or exudate. Lungs: Clear to auscultation bilaterally. Non-labored. Heart: Regular rate and rhythm, no murmurs rubs or gallops.  Abdomen: Bowel sounds are normal, mild tenderness in the epigastric area, nondistended, no hepatosplenomegaly or masses, no abdominal bruits or hernia , no rebound or guarding.   Extremities: No lower extremity edema. No clubbing or deformities. Neuro: Alert and oriented x 3.  Grossly intact. Skin: Warm and dry, no jaundice.   Psych: Alert and cooperative, normal mood and affect.  Labs:    Imaging Studies: No results found.  Assessment and Plan:   Vicki Dominguez is a 32 y.o. y/o female who comes in with signs and symptoms of irritable bowel syndrome. The patient states that she has lost 20  pounds which is confirmed by her last office appointment. The patient states that she eats less because she is worried about the pain after eating. The patient will be started on Lomotil. The patient does get relief from Bentyl and has been taking that 3 times a day. The patient has been told to take it before she eats. She also will be switched to Dexilant to see if this helps her reflux symptoms. If she does not improve her diarrhea  with these interventions she will be set up for repeat colonoscopy with biopsies to rule out microscopic colitis.    Vicki Miniumarren Krishan Mcbreen, MD. Clementeen GrahamFACG   Note: This dictation was prepared with Dragon dictation along with smaller phrase technology. Any transcriptional errors that result from this process are unintentional.

## 2017-11-03 ENCOUNTER — Other Ambulatory Visit: Payer: Self-pay

## 2017-11-03 ENCOUNTER — Emergency Department
Admission: EM | Admit: 2017-11-03 | Discharge: 2017-11-03 | Disposition: A | Discharge status: Home / Self Care | Payer: Medicaid Other | Attending: Emergency Medicine | Admitting: Emergency Medicine

## 2017-11-03 ENCOUNTER — Ambulatory Visit
Admission: EM | Admit: 2017-11-03 | Discharge: 2017-11-03 | Disposition: A | Discharge status: Home / Self Care | Payer: Medicaid Other | Attending: Family Medicine | Admitting: Family Medicine

## 2017-11-03 ENCOUNTER — Emergency Department: Payer: Medicaid Other

## 2017-11-03 DIAGNOSIS — R000 Tachycardia, unspecified: Secondary | ICD-10-CM | POA: Diagnosis not present

## 2017-11-03 DIAGNOSIS — Z79899 Other long term (current) drug therapy: Secondary | ICD-10-CM | POA: Insufficient documentation

## 2017-11-03 DIAGNOSIS — F17210 Nicotine dependence, cigarettes, uncomplicated: Secondary | ICD-10-CM | POA: Diagnosis not present

## 2017-11-03 DIAGNOSIS — R0602 Shortness of breath: Secondary | ICD-10-CM | POA: Insufficient documentation

## 2017-11-03 DIAGNOSIS — F329 Major depressive disorder, single episode, unspecified: Secondary | ICD-10-CM | POA: Diagnosis not present

## 2017-11-03 DIAGNOSIS — Z9889 Other specified postprocedural states: Secondary | ICD-10-CM | POA: Insufficient documentation

## 2017-11-03 DIAGNOSIS — R0781 Pleurodynia: Secondary | ICD-10-CM | POA: Diagnosis not present

## 2017-11-03 DIAGNOSIS — K219 Gastro-esophageal reflux disease without esophagitis: Secondary | ICD-10-CM | POA: Diagnosis not present

## 2017-11-03 DIAGNOSIS — R002 Palpitations: Secondary | ICD-10-CM

## 2017-11-03 DIAGNOSIS — R109 Unspecified abdominal pain: Secondary | ICD-10-CM | POA: Diagnosis not present

## 2017-11-03 DIAGNOSIS — R531 Weakness: Secondary | ICD-10-CM | POA: Diagnosis present

## 2017-11-03 DIAGNOSIS — R079 Chest pain, unspecified: Secondary | ICD-10-CM | POA: Diagnosis present

## 2017-11-03 LAB — COMPREHENSIVE METABOLIC PANEL
ALBUMIN: 4.8 g/dL (ref 3.5–5.0)
ALK PHOS: 88 U/L (ref 38–126)
ALT: 12 U/L — AB (ref 14–54)
AST: 23 U/L (ref 15–41)
Anion gap: 13 (ref 5–15)
BUN: 13 mg/dL (ref 6–20)
CALCIUM: 10 mg/dL (ref 8.9–10.3)
CO2: 22 mmol/L (ref 22–32)
CREATININE: 0.82 mg/dL (ref 0.44–1.00)
Chloride: 105 mmol/L (ref 101–111)
GFR calc Af Amer: >60 mL/min (ref >60)
GFR calc non Af Amer: >60 mL/min (ref >60)
GLUCOSE: 95 mg/dL (ref 65–99)
Potassium: 3.7 mmol/L (ref 3.5–5.1)
SODIUM: 140 mmol/L (ref 135–145)
Total Bilirubin: 0.5 mg/dL (ref 0.3–1.2)
Total Protein: 8.4 g/dL — ABNORMAL HIGH (ref 6.5–8.1)

## 2017-11-03 LAB — URINALYSIS, COMPLETE (UACMP) WITH MICROSCOPIC
Bacteria, UA: NONE SEEN
Bilirubin Urine: NEGATIVE
Glucose, UA: NEGATIVE mg/dL
Hgb urine dipstick: NEGATIVE
KETONES UR: 20 mg/dL — AB
Leukocytes, UA: NEGATIVE
Nitrite: NEGATIVE
PH: 7 (ref 5.0–8.0)
PROTEIN: 30 mg/dL — AB
RBC / HPF: NONE SEEN RBC/hpf (ref 0–5)
Specific Gravity, Urine: 1.028 (ref 1.005–1.030)

## 2017-11-03 LAB — TROPONIN I

## 2017-11-03 LAB — CBC WITH DIFFERENTIAL/PLATELET
Basophils Absolute: 0 10*3/uL (ref 0–0.1)
Basophils Relative: 0 %
EOS ABS: 0.1 10*3/uL (ref 0–0.7)
EOS PCT: 1 %
HCT: 45.9 % (ref 35.0–47.0)
HEMOGLOBIN: 15 g/dL (ref 12.0–16.0)
LYMPHS ABS: 1.8 10*3/uL (ref 1.0–3.6)
Lymphocytes Relative: 27 %
MCH: 28.6 pg (ref 26.0–34.0)
MCHC: 32.7 g/dL (ref 32.0–36.0)
MCV: 87.4 fL (ref 80.0–100.0)
MONOS PCT: 7 %
Monocytes Absolute: 0.4 10*3/uL (ref 0.2–0.9)
NEUTROS PCT: 65 %
Neutro Abs: 4.2 10*3/uL (ref 1.4–6.5)
Platelets: 223 10*3/uL (ref 150–440)
RBC: 5.26 MIL/uL — ABNORMAL HIGH (ref 3.80–5.20)
RDW: 13.5 % (ref 11.5–14.5)
WBC: 6.6 10*3/uL (ref 3.6–11.0)

## 2017-11-03 LAB — FIBRIN DERIVATIVES D-DIMER (ARMC ONLY): FIBRIN DERIVATIVES D-DIMER (ARMC): 246.99 ng{FEU}/mL (ref 0.00–499.00)

## 2017-11-03 LAB — LIPASE, BLOOD: Lipase: 22 U/L (ref 11–51)

## 2017-11-03 MED ORDER — SODIUM CHLORIDE 0.9 % IV BOLUS (SEPSIS)
1000.0000 mL | Freq: Once | INTRAVENOUS | Status: AC
  Administered 2017-11-03: 1000 mL via INTRAVENOUS

## 2017-11-03 MED ORDER — AMOXICILLIN-POT CLAVULANATE 875-125 MG PO TABS: 1.0000 | ORAL_TABLET | Freq: Two times a day (BID) | ORAL | 0 refills | Status: DC

## 2017-11-03 MED ORDER — HYDROCODONE-ACETAMINOPHEN 5-325 MG PO TABS: 1.0000 | ORAL_TABLET | Freq: Four times a day (QID) | ORAL | 0 refills | Status: AC | PRN

## 2017-11-03 MED ORDER — MORPHINE SULFATE (PF) 4 MG/ML IV SOLN
4.0000 mg | Freq: Once | INTRAVENOUS | Status: AC
  Administered 2017-11-03: 4 mg via INTRAVENOUS
  Filled 2017-11-03: qty 1

## 2017-11-03 MED ORDER — METRONIDAZOLE 500 MG PO TABS: 500.0000 mg | ORAL_TABLET | Freq: Two times a day (BID) | ORAL | 0 refills | Status: AC

## 2017-11-03 MED ORDER — OXYCODONE-ACETAMINOPHEN 5-325 MG PO TABS
1.0000 | ORAL_TABLET | Freq: Once | ORAL | Status: AC
  Administered 2017-11-03: 1 via ORAL
  Filled 2017-11-03: qty 1

## 2017-11-03 MED ORDER — FAMOTIDINE 20 MG PO TABS
20.0000 mg | ORAL_TABLET | Freq: Once | ORAL | Status: AC
  Administered 2017-11-03: 20 mg via ORAL
  Filled 2017-11-03: qty 1

## 2017-11-03 NOTE — ED Notes (Signed)
NEG POC Preg test

## 2017-11-03 NOTE — ED Provider Notes (Signed)
[facility] ____________________________________________  Time seen: Approximately 3:41 PM  I have reviewed the triage vital signs and the nursing notes.   HISTORY  Chief Complaint No chief complaint on file.   HPI Vicki Dominguez is a 32 y.o. female past medical history of hypokalemia, IBS, anxiety, GERD, presenting for evaluation of 2 days of rib pain that started on the right side and now migrates to the left, shortness of breath intermittently, generalized weakness.  States she does have intermittent palpitations  And rapid heartbeat sensation.  States pain to her ribs is present at rest, palpation and deep breath.  Reports she has had diarrhea present with at least 3 episodes of watery stool per day for the last 1 week.  Chronic IBS with intermittent diarrheal flares, but states 1 week of diarrhea is more than normal.  No vomiting.  Denies fevers, cough, congestion, abdominal pain.  No chest pain.  No hemoptysis.  Denies current anxiety.  States some bilateral hand tingling sensation currently.  Recent lower wisdom tooth extraction. Denies immobilization.  Has continue to remain active.  Occasional alcohol use.  Denies drug use.  Past smoker.  No oral contraceptives.  Reports mother died at 8 with cardiac issues as well as multiple past PEs.  Also reports maternal grandmother with blood clot history.  Patient's last menstrual period was 10/20/2017 (approximate).  Denies pregnancy [facility]: PCP   Past Medical History:  Diagnosis Date  . Anxiety   . Arthritis    hands, legs  . Colitis   . Depression   . Family history of adverse reaction to anesthesia    Mother difficult to awaken after Succinycholine  . GERD (gastroesophageal reflux disease)   . Migraine    Rare.  Tension HA 3x/wk  . Ovarian cyst   . TMJ arthritis   . TMJ syndrome     Patient Active Problem List   Diagnosis Date Noted  . Opioid use disorder, severe, dependence ([facility]) 01/24/2017    . Tobacco use disorder 01/24/2017  . Cannabis use disorder, moderate, dependence ([facility]) 01/24/2017  . Severe recurrent major depression without psychotic features ([facility]) 01/23/2017  . GERD (gastroesophageal reflux disease) 02/28/2016    Past Surgical History:  Procedure Laterality Date  . COLONOSCOPY WITH PROPOFOL N/A 07/03/2016   Procedure: COLONOSCOPY WITH PROPOFOL;  Surgeon: Midge Minium, MD;  Location[facility];  Service: Endoscopy;  Laterality: N/A;  . ESOPHAGOGASTRODUODENOSCOPY N/A 03/06/2016   Procedure: ESOPHAGOGASTRODUODENOSCOPY (EGD);  Surgeon: Scot Jun, MD;  Location[facility];  Service: Endoscopy;  Laterality: N/A;  . LAPAROSCOPIC OVARIAN CYSTECTOMY Left 03/07/2016   Procedure: LAPAROSCOPIC OVARIAN CYSTECTOMY;  Surgeon: Conard Novak, MD;  Location: [facility];  Service: Gynecology;  Laterality: Left;  . OVARIAN CYST REMOVAL    . TEMPOROMANDIBULAR JOINT SURGERY       No current facility-administered medications for this encounter.   Current Outpatient Medications:  .  alprazolam (XANAX) 2 MG tablet, Take 2 mg by mouth 2 (two) times daily. , Disp: , Rfl:  .  dicyclomine (BENTYL) 10 MG capsule, Take 2 capsules (20 mg total) by mouth every 6 (six) hours, Disp: 30 capsule, Rfl: 0 .  diphenoxylate-atropine (LOMOTIL) 2.5-0.025 MG tablet, Take 1 tablet by mouth 4 (four) times daily as needed for diarrhea or loose stools., Disp: 60 tablet, Rfl: 5 .  Multiple Vitamin (MULTIVITAMIN) tablet, Take 1 tablet by mouth daily. , Disp: , Rfl:  .  pantoprazole (PROTONIX) 40 MG tablet, Take 1  tablet (40 mg total) by mouth daily., Disp: 30 tablet, Rfl: 0 .  promethazine (PHENERGAN) 25 MG tablet, Take 25 mg by mouth every 6 (six) hours as needed for nausea or vomiting., Disp: , Rfl:  .  sucralfate (CARAFATE) 1 GM/10ML suspension, Take 10 g by mouth 4 (four) times daily as needed. , Disp: , Rfl:  .  VYVANSE 60 MG capsule, Take 60 mg by mouth daily. , Disp: , Rfl:  0  Allergies Succinylcholine chloride and Tape  Family History  Problem Relation Age of Onset  . Heart attack Mother   . Hypertension Father     Social History Social History   Tobacco Use  . Smoking status: Current Every Day Smoker    Packs/day: 0.50    Years: 10.00    Pack years: 5.00    Types: Cigarettes  . Smokeless tobacco: Never Used  Substance Use Topics  . Alcohol use: No    Alcohol/week: 0.0 oz    Comment: occasionally  . Drug use: No    Review of Systems Constitutional: No fever/chills. ENT: No sore throat. Cardiovascular: Denies chest pain. Respiratory:AS above.  Gastrointestinal: No abdominal pain.  No nausea, no vomiting.AS above.. Genitourinary: Negative for dysuria. Musculoskeletal: AS above. Skin: Negative for rash.   ____________________________________________   PHYSICAL EXAM:  VITAL SIGNS: ED Triage Vitals  Enc Vitals Group     BP 11/03/17 1449 111/72     Pulse Rate 11/03/17 1449 (!) 115     Resp -- 24     Temp 11/03/17 1449 98.3 F (36.8 C)     Temp Source 11/03/17 1449 Oral     SpO2 11/03/17 1449 100 %     Weight 11/03/17 1452 120 lb (54.4 kg)     Height 11/03/17 1452 5\' 2"  (1.575 m)     Head Circumference --      Peak Flow --      Pain Score 11/03/17 1453 7     Pain Loc --      Pain Edu? --      Excl. in [place]? --     Constitutional: Alert and oriented. Well appearing and in no acute distress. ENT      Head: Normocephalic and atraumatic.      Nose: No congestion/rhinnorhea.      Mouth/Throat: Mucous membranes are moist.Oropharynx non-erythematous. Cardiovascular: Normal rate, regular rhythm. Grossly normal heart sounds.  Good peripheral circulation. Respiratory: Normal respiratory effort without tachypnea nor retractions. Breath sounds are clear and equal bilaterally. No wheezes, rales, rhonchi. Gastrointestinal: Soft and nontender. No distention. Normal Bowel sounds.  Musculoskeletal:  No midline cervical, thoracic or lumbar  tenderness to palpation. Bilateral pedal pulses equal and easily palpated.  Bilateral lower extremities nontender no edema noted.  Diffuse right lateral and posterior mid rib tenderness, diffuse left posterior rib tenderness, no ecchymosis, no palpable rib fracture, no rash. Neurologic:  Normal speech and language. No gross focal neurologic deficits are appreciated. Speech is normal. No gait instability.  Skin:  Skin is warm, dry. Psychiatric: Mood and affect are normal. Speech and behavior are normal. Patient exhibits appropriate insight and judgment   ___________________________________________   LABS (all labs ordered are listed, but only abnormal results are displayed)  Labs Reviewed - No data to display ____________________________________________  EKG  ED ECG REPORT I, Renford Dills, the attending provider, personally viewed and interpreted this ECG.   Date: 11/03/2017  EKG Time: 1504  Rate: 99  Rhythm: Sinus rhythm with occasional PVCs  Axis: normal  Intervals:none  ST&T Change: none noted  ____________________________________________  RADIOLOGY  No results found. ____________________________________________   PROCEDURES Procedures    INITIAL IMPRESSION / ASSESSMENT AND PLAN / ED COURSE  Pertinent labs & imaging results that were available during my care of the patient were reviewed by me and considered in my medical decision making (see chart for details).  Patient presenting for evaluation of rib pain, shortness of breath, weakness, diarrhea, hand tingling sensation.  Patient initially tachycardic at 160 when nursing staff brought her to exam room.  Patient then had continued intermittent episodes of tachycardia quickly ranging from 85-135, unable to capture on EKG.  No personal history of PE or DVT.  Rib pain reproducible on exam.  However strong family history of PE per patient.  Discussed multiple differentials with patient including musculoskeletal pain,  anxiety, dehydration, hypokalemia, PE.  Patient states that she does not feel right and feels weak.  Due to family history and presenting symptoms, recommend further evaluation in the ER at this time, discussed evaluation of labs including d-dimer in urgent care, patient declined and states will defer to the ER.  With tachycardia recommend EMS transfer, patient agrees.  Harvie Heckandy RN charge nurse called and given report.  Patient stable at time of discharge.   ____________________________________________   FINAL CLINICAL IMPRESSION(S) / ED DIAGNOSES  Final diagnoses:  Rib pain  SOB (shortness of breath)  Tachycardia, unspecified     ED Discharge Orders    None       Note: This dictation was prepared with Dragon dictation along with smaller phrase technology. Any transcriptional errors that result from this process are unintentional.         Renford DillsMiller, Favour Aleshire, NP 11/03/17 1642

## 2017-11-03 NOTE — ED Provider Notes (Addendum)
[facility] Emergency Department Provider Note ____________________________________________   First MD Initiated Contact with Patient 11/03/17 1629     (approximate)  I have reviewed the triage vital signs and the nursing notes.   HISTORY  Chief Complaint Chest Pain (From right rib traveling left across,  SOB)    HPI Vicki Dominguez is a 32 y.o. female with past medical history as noted below who presents with bilateral rib pain over the last 1-2 days, relatively acute onset, and associated with shortness of breath over the last day as well as generalized weakness.  Onset is relatively acute, course is constant, and it is not relieved by anything at home.  Patient denies any prior history of the chest pain or shortness of breath.  The patient also reports some more chronic symptoms; she states she has had diarrhea over the last 1-2 weeks, as well as intermittent diffuse abdominal discomfort which she states is similar to prior exacerbations of her colitis.  She reports nausea but no vomiting, and denies cough, fever, or headache.  Patient reports wisdom tooth extraction several weeks ago, but no other recent surgeries or any immobilization.  She has a family history of PE, but no prior history herself.  Past Medical History:  Diagnosis Date  . Anxiety   . Arthritis    hands, legs  . Colitis   . Depression   . Family history of adverse reaction to anesthesia    Mother difficult to awaken after Succinycholine  . GERD (gastroesophageal reflux disease)   . Migraine    Rare.  Tension HA 3x/wk  . Ovarian cyst   . TMJ arthritis   . TMJ syndrome     Patient Active Problem List   Diagnosis Date Noted  . Opioid use disorder, severe, dependence (HCC) 01/24/2017  . Tobacco use disorder 01/24/2017  . Cannabis use disorder, moderate, dependence (HCC) 01/24/2017  . Severe recurrent major depression without psychotic features (HCC) 01/23/2017  . GERD  (gastroesophageal reflux disease) 02/28/2016    Past Surgical History:  Procedure Laterality Date  . COLONOSCOPY WITH PROPOFOL N/A 07/03/2016   Procedure: COLONOSCOPY WITH PROPOFOL;  Surgeon: Midge Minium, MD;  Location: [facility];  Service: Endoscopy;  Laterality: N/A;  . ESOPHAGOGASTRODUODENOSCOPY N/A 03/06/2016   Procedure: ESOPHAGOGASTRODUODENOSCOPY (EGD);  Surgeon: Scot Jun, MD;  Location: [facility] ENDOSCOPY;  Service: Endoscopy;  Laterality: N/A;  . LAPAROSCOPIC OVARIAN CYSTECTOMY Left 03/07/2016   Procedure: LAPAROSCOPIC OVARIAN CYSTECTOMY;  Surgeon: Conard Novak, MD;  Location: [facility] ORS;  Service: Gynecology;  Laterality: Left;  . OVARIAN CYST REMOVAL    . TEMPOROMANDIBULAR JOINT SURGERY      Prior to Admission medications   Medication Sig Start Date End Date Taking? Authorizing Provider  alprazolam Prudy Feeler) 2 MG tablet Take 2 mg by mouth 2 (two) times daily.    Yes [provider]  dicyclomine (BENTYL) 10 MG capsule Take 2 capsules (20 mg total) by mouth every 6 (six) hours 08/31/17 11/03/17 Yes Wohl, Darren, MD  Multiple Vitamin (MULTIVITAMIN) tablet Take 1 tablet by mouth daily.    Yes [provider]  pantoprazole (PROTONIX) 40 MG tablet Take 1 tablet (40 mg total) by mouth daily. 01/26/17  Yes Jimmy Footman, MD  promethazine (PHENERGAN) 25 MG tablet Take 25 mg by mouth every 6 (six) hours as needed for nausea or vomiting.   Yes [provider]  sucralfate (CARAFATE) 1 GM/10ML suspension Take 10 g by mouth 4 (four) times daily as  needed.  07/12/16  Yes [provider]  VYVANSE 60 MG capsule Take 60 mg by mouth daily.  07/23/17  Yes [provider]  diphenoxylate-atropine (LOMOTIL) 2.5-0.025 MG tablet Take 1 tablet by mouth 4 (four) times daily as needed for diarrhea or loose stools. 09/26/17   Midge Minium, MD    Allergies Succinylcholine chloride and Tape  Family History  Problem Relation Age of Onset  .  Heart attack Mother   . Hypertension Father     Social History Social History   Tobacco Use  . Smoking status: Current Every Day Smoker    Packs/day: 0.50    Years: 10.00    Pack years: 5.00    Types: Cigarettes  . Smokeless tobacco: Never Used  Substance Use Topics  . Alcohol use: No    Alcohol/week: 0.0 oz    Comment: occasionally  . Drug use: No    Review of Systems  Constitutional: No fever/chills. Eyes: No redness. ENT: No sore throat. Cardiovascular: Positive for lateral chest wall pain. Respiratory: Positive for shortness of breath. Gastrointestinal: Positive for nausea, no vomiting.  Positive for diarrhea.  Genitourinary: Negative for dysuria.  Musculoskeletal: Positive for back pain. Skin: Negative for rash. Neurological: Negative for headache.   ____________________________________________   PHYSICAL EXAM:  VITAL SIGNS: ED Triage Vitals  Enc Vitals Group     BP 11/03/17 1618 (!) 129/95     Pulse Rate 11/03/17 1618 (!) 106     Resp 11/03/17 1618 18     Temp 11/03/17 1618 98.4 F (36.9 C)     Temp Source 11/03/17 1618 Oral     SpO2 11/03/17 1618 100 %     Weight 11/03/17 1623 128 lb 9.6 oz (58.3 kg)     Height 11/03/17 1623 5\' 2"  (1.575 m)     Head Circumference --      Peak Flow --      Pain Score 11/03/17 1622 8     Pain Loc --      Pain Edu? --      Excl. in GC? --     Constitutional: Alert and oriented. Well appearing and in no acute distress. Eyes: Conjunctivae are normal.  Head: Atraumatic. Nose: No congestion/rhinnorhea. Mouth/Throat: Mucous membranes are dry.   Neck: Normal range of motion.  Cardiovascular: Borderline tachycardic, slightly irregular rhythm. Grossly normal heart sounds.  Good peripheral circulation.  Right lateral chest wall/lower rib area reproducible tenderness. Respiratory: Normal respiratory effort.  No retractions. Lungs CTAB. Gastrointestinal: Soft with mild diffuse discomfort but no focal tenderness. No  distention.  Genitourinary: No flank tenderness. Musculoskeletal: No lower extremity edema.  No calf or popliteal swelling or tenderness.  Extremities warm and well perfused.  Neurologic:  Normal speech and language. No gross focal neurologic deficits are appreciated.  Skin:  Skin is warm and dry. No rash noted. Psychiatric: Mood and affect are normal. Speech and behavior are normal.  ____________________________________________   LABS (all labs ordered are listed, but only abnormal results are displayed)  Labs Reviewed  COMPREHENSIVE METABOLIC PANEL - Abnormal; Notable for the following components:      Result Value   Total Protein 8.4 (*)    ALT 12 (*)    All other components within normal limits  CBC WITH DIFFERENTIAL/PLATELET - Abnormal; Notable for the following components:   RBC 5.26 (*)    All other components within normal limits  URINALYSIS, COMPLETE (UACMP) WITH MICROSCOPIC - Abnormal; Notable for the following components:  Color, Urine YELLOW (*)    APPearance HAZY (*)    Ketones, ur 20 (*)    Protein, ur 30 (*)    Squamous Epithelial / LPF 0-5 (*)    All other components within normal limits  LIPASE, BLOOD  FIBRIN DERIVATIVES D-DIMER ([facility] ONLY)  TROPONIN I   ____________________________________________  EKG  ED ECG REPORT I, Dionne BucySebastian Lincoln Ginley, the attending physician, personally viewed and interpreted this ECG.  Date: 11/03/2017 EKG Time: 1621 Rate: 96 Rhythm: normal sinus rhythm with sinus arrhythmia QRS Axis: normal Intervals: normal ST/T Wave abnormalities: Nonspecific T wave abnormalities Narrative Interpretation: no evidence of acute ischemia; no significant change when compared to EKG of 01/29/2017  ____________________________________________  RADIOLOGY  CXR: No focal infiltrate or other acute findings  ____________________________________________   PROCEDURES  Procedure(s) performed: No  Procedures  Critical Care performed:  No ____________________________________________   INITIAL IMPRESSION / ASSESSMENT AND PLAN / ED COURSE  Pertinent labs & imaging results that were available during my care of the patient were reviewed by me and considered in my medical decision making (see chart for details).  32 year old female with past medical history as noted above including history of ulcerative colitis, recurrent UTIs, IBS, GERD, and anxiety presents with multiple symptoms, but most acutely with bilateral lateral chest wall pain and shortness of breath over the last 2 days, as well as generalized weakness.  The pain is somewhat migratory and pleuritic.Marland Kitchen.  She also reports subacute history of diarrhea and abdominal discomfort similar to prior exacerbations of colitis.  I reviewed the past medical records in Epic; patient has had multiple ED visits over the last 2 years, both for colitis, recurrent UTI, as well as for depression.  No recent prior visit with a similar presentation to today.  I reviewed the urgent care note from earlier today as well.  On exam, the patient is borderline tachycardic and in sinus arrhythmia, but other vital signs are normal.  The remainder the exam is unremarkable except for mild abdominal discomfort and reproducible right lateral chest wall/rib tenderness.  Patient is overall extremely well-appearing.  EKG is unchanged from prior.  In terms of the chest discomfort, shortness of breath, generalized weakness, differential includes dehydration, anemia, hypokalemia or other electrolyte abnormality, bronchitis, viral syndrome, or less likely PE or ACS.  Patient has family history, but no other specific risk factors and is still overall relatively low risk for PE.  Plan: Chest x-ray, basic and cardiac labs, d-dimer given low risk for PE, and symptomatic treatment.  In terms of the GI symptoms, these appear to be recurrence of chronic symptoms.  Patient reports she has had similar symptoms in the past.   Given the minimal abdominal discomfort, reassuring vital signs, patient's well appearance, I have a low suspicion overall for acute colitis or other intra-abdominal complication.  We will obtain basic and hepatobiliary labs, give IV Pepcid, and reassess.  If concerning lab findings such as significantly elevated white blood cell count, consider imaging.    ----------------------------------------- 7:20 PM on 11/03/2017 -----------------------------------------  Patient's workup is largely unremarkable.  Her d-dimer and troponin are negative.  Given her overall low risk for PE, this is sufficient to ruled out, and no additional PE workup is indicated.  Her electrolytes and CBC are also within normal limits there is no elevated WBC.  UA is also negative.  Her chest x-ray shows no acute findings.  In terms of the chest wall discomfort slight shortness of breath, and weakness, differential includes viral  syndrome, bronchitis, dehydration, or perhaps radiating pain from her abdomen, or other benign etiology.  On reassessment, the patient appears comfortable, vital signs are stable, and she has minimal bilateral lower abdominal discomfort to palpation.  She feels comfortable to go home.    We discussed multiple options in regards to further workup and/or empiric treatment for possible colitis.  Patient states that her symptoms feel very similar to when she has had bouts of colitis in the past.  At this time, given the reassuring abdominal exam, the lack of elevated WBC count or fever, or any new symptoms that she has not had previously, there is no clinical evidence of severe colitis or complication such as abscess or perforation.  Based on shared decision making with the patient, and concerns for unnecessary radiation exposure, the patient agrees that doing a CT at this time is unnecessary.  We discussed whether to do a watch and wait approach and have her follow-up with her gastroenterologist or start  empiric treatment based on the similarity of her symptoms compared to prior colitis.  Patient would prefer to start empiric treatment.  I will give prescriptions for Augmentin and Flagyl which patient has been treated with before, and instructed her to follow-up with her gastroenterologist within the next week as they had planned for possible colonoscopy if patient's symptoms recurred.  I gave the patient thorough return precautions both for the chest discomfort and shortness of breath, as well as the abdominal pain and concern for possible colitis, and she expressed understanding.  ____________________________________________   FINAL CLINICAL IMPRESSION(S) / ED DIAGNOSES  Final diagnoses:  Shortness of breath  Abdominal pain, unspecified abdominal location      NEW MEDICATIONS STARTED DURING THIS VISIT:  New Prescriptions   No medications on file     Note:  This document was prepared using Dragon voice recognition software and may include unintentional dictation errors.       Dionne Bucy, MD 11/03/17 [phone]

## 2017-11-03 NOTE — ED Triage Notes (Signed)
Right rib pain traveling across to left, with SOB, diarrhea, and nausea. Increased acid reflux  Oral surgery three weeks ago, bottom wisdom teeth removed

## 2017-11-03 NOTE — ED Triage Notes (Signed)
Patient states she noticed lower back pain two days ago is now having upper right and left rib pain since this morning.Patient states she has noticed an increase in her acid reflux and has been experiencing SOB since this morning,

## 2017-11-03 NOTE — Discharge Instructions (Signed)
We are starting treatment today for possible recurrent colitis.  You should take the antibiotics as prescribed and finish the full course, and continue to take your other medications such as the Bentyl.  Call your gastroenterologist on Monday to arrange for follow-up within the next 1-2 weeks.  Return to the emergency department immediately for new, worsening, or persistent abdominal pain, worsening diarrhea, new or worsening difficulty breathing, chest pain or discomfort, weakness or lightheadedness, fevers, vomiting or inability to tolerate the medications, blood in the stool or vomit, or any other new or worsening symptoms that concern you.

## 2017-11-07 ENCOUNTER — Other Ambulatory Visit: Payer: Self-pay | Admitting: Gastroenterology

## 2017-11-07 ENCOUNTER — Other Ambulatory Visit: Payer: Self-pay

## 2017-11-07 MED ORDER — DICYCLOMINE HCL 20 MG PO TABS: 20.0000 mg | ORAL_TABLET | Freq: Three times a day (TID) | ORAL | 1 refills | Status: DC

## 2017-11-07 NOTE — Progress Notes (Signed)
dicy

## 2017-12-03 ENCOUNTER — Ambulatory Visit
Admission: EM | Admit: 2017-12-03 | Discharge: 2017-12-03 | Disposition: A | Discharge status: Home / Self Care | Payer: Medicaid Other | Attending: Family Medicine | Admitting: Family Medicine

## 2017-12-03 ENCOUNTER — Ambulatory Visit: Payer: Medicaid Other

## 2017-12-03 ENCOUNTER — Other Ambulatory Visit: Payer: Self-pay

## 2017-12-03 DIAGNOSIS — M1991 Primary osteoarthritis, unspecified site: Secondary | ICD-10-CM | POA: Insufficient documentation

## 2017-12-03 DIAGNOSIS — Z8701 Personal history of pneumonia (recurrent): Secondary | ICD-10-CM | POA: Diagnosis not present

## 2017-12-03 DIAGNOSIS — F1120 Opioid dependence, uncomplicated: Secondary | ICD-10-CM | POA: Insufficient documentation

## 2017-12-03 DIAGNOSIS — F339 Major depressive disorder, recurrent, unspecified: Secondary | ICD-10-CM | POA: Diagnosis not present

## 2017-12-03 DIAGNOSIS — K219 Gastro-esophageal reflux disease without esophagitis: Secondary | ICD-10-CM | POA: Insufficient documentation

## 2017-12-03 DIAGNOSIS — M19041 Primary osteoarthritis, right hand: Secondary | ICD-10-CM | POA: Diagnosis not present

## 2017-12-03 DIAGNOSIS — R000 Tachycardia, unspecified: Secondary | ICD-10-CM | POA: Insufficient documentation

## 2017-12-03 DIAGNOSIS — F17210 Nicotine dependence, cigarettes, uncomplicated: Secondary | ICD-10-CM | POA: Insufficient documentation

## 2017-12-03 DIAGNOSIS — F419 Anxiety disorder, unspecified: Secondary | ICD-10-CM | POA: Insufficient documentation

## 2017-12-03 DIAGNOSIS — M19042 Primary osteoarthritis, left hand: Secondary | ICD-10-CM | POA: Diagnosis not present

## 2017-12-03 DIAGNOSIS — I499 Cardiac arrhythmia, unspecified: Secondary | ICD-10-CM

## 2017-12-03 DIAGNOSIS — F1290 Cannabis use, unspecified, uncomplicated: Secondary | ICD-10-CM | POA: Insufficient documentation

## 2017-12-03 DIAGNOSIS — Z79899 Other long term (current) drug therapy: Secondary | ICD-10-CM | POA: Insufficient documentation

## 2017-12-03 DIAGNOSIS — R0789 Other chest pain: Secondary | ICD-10-CM | POA: Diagnosis present

## 2017-12-03 MED ORDER — NAPROXEN 500 MG PO TABS: 500.0000 mg | ORAL_TABLET | Freq: Two times a day (BID) | ORAL | 0 refills | Status: DC

## 2017-12-03 NOTE — ED Provider Notes (Signed)
[facility]    CSN: [id number] Arrival date & time: 12/03/17  1252     History   Chief Complaint Chief Complaint  Patient presents with  . Chest Pain    RIB pain, APPT    HPI Vicki Dominguez is a 32 y.o. female with past medical history as below who presents with complaint of right-sided rib pain that first noticed this morning.   Patient is a 32 year old female she woke up.  States is worse with moving, deep breaths, and talking.  She states that the pain does seem to move around to the left occasionally.  Patient denies any cough but does report some shortness of breath.  Chest still feels like her heart rate is up.  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  She reports some chills yesterday and today and has had some sweating.  She however denies any nasal congestion, runny nose or eye issues.  She has been taken Tylenol.  Of note, patient was seen in this clinic in the ER for this same complaint on February 9, which upon review of the notes revealed similar pain complaints as well as the tachycardia.  Patient had a apparent full workup done in the ER with nothing new found.  Patient was to follow-up with her gastroenterologist as this could have been some referred pain from her colitis and she was also treated for possible colitis with Augmentin and Flagyl at that time.      Past Medical History:  Diagnosis Date  . Anxiety   . Arthritis    hands, legs  . Colitis   . Depression   . Family history of adverse reaction to anesthesia    Mother difficult to awaken after Succinycholine  . GERD (gastroesophageal reflux disease)   . Migraine    Rare.  Tension HA 3x/wk  . Ovarian cyst   . TMJ arthritis   . TMJ syndrome     Patient Active Problem List   Diagnosis Date Noted  . Opioid use disorder, severe, dependence (HCC) 01/24/2017  . Tobacco use disorder 01/24/2017  . Cannabis use disorder, moderate, dependence (HCC) 01/24/2017  .  Severe recurrent major depression without psychotic features (HCC) 01/23/2017  . GERD (gastroesophageal reflux disease) 02/28/2016    Past Surgical History:  Procedure Laterality Date  . COLONOSCOPY WITH PROPOFOL N/A 07/03/2016   Procedure: COLONOSCOPY WITH PROPOFOL;  Surgeon: Midge Minium, MD;  Location: [facility];  Service: Endoscopy;  Laterality: N/A;  . ESOPHAGOGASTRODUODENOSCOPY N/A 03/06/2016   Procedure: ESOPHAGOGASTRODUODENOSCOPY (EGD);  Surgeon: Scot Jun, MD;  Location: [facility];  Service: Endoscopy;  Laterality: N/A;  . LAPAROSCOPIC OVARIAN CYSTECTOMY Left 03/07/2016   Procedure: LAPAROSCOPIC OVARIAN CYSTECTOMY;  Surgeon: Conard Novak, MD;  Location: [facility] OR[facility];  Service: Gynecology;  Laterality: Left;  . OVARIAN CYST REMOVAL    . TEMPOROMANDIBULAR JOINT SURGERY      OB History    No data available       Home Medications    Prior to Admission medications   Medication Sig Start Date End Date Taking? Authorizing Provider  alprazolam Prudy Feeler) 2 MG tablet Take 2 mg by mouth 2 (two) times daily.    Yes [provider]  dicyclomine (BENTYL) 20 MG tablet Take 1 tablet (20 mg total) by mouth 3 (three) times daily before meals. 11/07/17  Yes Midge Minium, MD  diphenoxylate-atropine (LOMOTIL) 2.5-0.025 MG tablet Take 1 tablet by mouth 4 (four) times  daily as needed for diarrhea or loose stools. 09/26/17  Yes Midge MiniumWohl, Darren, MD  Multiple Vitamin (MULTIVITAMIN) tablet Take 1 tablet by mouth daily.    Yes [provider]  pantoprazole (PROTONIX) 40 MG tablet Take 1 tablet (40 mg total) by mouth daily. 01/26/17  Yes Jimmy FootmanHernandez-Gonzalez, Andrea, MD  promethazine (PHENERGAN) 25 MG tablet Take 25 mg by mouth every 6 (six) hours as needed for nausea or vomiting.   Yes [provider]  sucralfate (CARAFATE) 1 GM/10ML suspension Take 10 g by mouth 4 (four) times daily as needed.  07/12/16  Yes [provider]  VYVANSE 60 MG capsule Take 60 mg  by mouth daily.  07/23/17  Yes [provider]  dicyclomine (BENTYL) 10 MG capsule Take 2 capsules (20 mg total) by mouth every 6 (six) hours 08/31/17 11/03/17  Midge MiniumWohl, Darren, MD  naproxen (NAPROSYN) 500 MG tablet Take 1 tablet (500 mg total) by mouth 2 (two) times daily. 12/03/17   Candis SchatzHarris, Michael D, PA-C    Family History Family History  Problem Relation Age of Onset  . Heart attack Mother   . Hypertension Father     Social History Social History   Tobacco Use  . Smoking status: Current Every Day Smoker    Packs/day: 0.50    Years: 10.00    Pack years: 5.00    Types: Cigarettes  . Smokeless tobacco: Never Used  Substance Use Topics  . Alcohol use: No    Alcohol/week: 0.0 oz    Comment: occasionally  . Drug use: No     Allergies   Succinylcholine chloride and Tape   Review of Systems Review of Systems  As noted above in HPI.  Other systems reviewed and found to be negative  Physical Exam Triage Vital Signs ED Triage Vitals  Enc Vitals Group     BP 12/03/17 1324 120/70     Pulse Rate 12/03/17 1324 (!) 117     Resp 12/03/17 1324 18     Temp 12/03/17 1324 98.1 F (36.7 C)     Temp Source 12/03/17 1324 Oral     SpO2 12/03/17 1324 100 %     Weight 12/03/17 1321 120 lb (54.4 kg)     Height 12/03/17 1321 5\' 2"  (1.575 m)     Head Circumference --      Peak Flow --      Pain Score 12/03/17 1321 5     Pain Loc --      Pain Edu? --      Excl. in GC? --    No data found.  Updated Vital Signs BP 120/70 (BP Location: Left Arm)   Pulse (!) 117   Temp 98.1 F (36.7 C) (Oral)   Resp 18   Ht 5\' 2"  (1.575 m)   Wt 120 lb (54.4 kg)   LMP 11/19/2017 Comment: denies preg  SpO2 100%   BMI 21.95 kg/m    Physical Exam  Constitutional: She appears well-developed and well-nourished. She does not appear ill.  HENT:  Head: Normocephalic and atraumatic.  Eyes: EOM are normal. Pupils are equal, round, and reactive to light.  Neck: Normal range of motion.    Cardiovascular: Normal pulses. Tachycardia present. Exam reveals no gallop.  No murmur heard. Irregular rhythm  Pulmonary/Chest: Effort normal and breath sounds normal. No respiratory distress. She has no decreased breath sounds.  Chest wall tenderness to palpation of right posterior and axillary ribs.  No crepitus or mass noted.  Abdominal:  Soft.  Musculoskeletal: Normal range of motion.     UC Treatments / Results  Labs (all labs ordered are listed, but only abnormal results are displayed) Labs Reviewed - No data to display  EKG Sinus rhythm with sinus arrhythmia with occasional PVCs and fusion complexes.  Possible left atrial enlargement.  Ventricular rate of 99.  QTc 438.  No ST changes.  Radiology Dg Chest 2 View  Result Date: 12/03/2017 CLINICAL DATA:  Pt states she has had right lower ant rib pain/soreness x 2 days with more pain this am. No other symptoms except weakness and tachycardia. Current smoker, hx of pneumonia EXAM: CHEST - 2 VIEW COMPARISON:  Chest x-ray dated 11/03/2017 FINDINGS: Lungs are clear. No pleural effusion or pneumothorax. Heart size and mediastinal contours are normal. No acute or suspicious osseous finding. IMPRESSION: No active cardiopulmonary disease. No evidence of pneumonia or pulmonary edema. Electronically Signed   By: Bary Richard M.D.   On: 12/03/2017 13:58    Procedures Procedures (including critical care time)  Medications Ordered in UC Medications - No data to display   Initial Impression / Assessment and Plan / UC Course  I have reviewed the triage vital signs and the nursing notes.  Pertinent labs & imaging results that were available during my care of the patient were reviewed by me and considered in my medical decision making (see chart for details).    Patient with right chest wall pain, is worse with breathing and cough.  Patient was similar symptoms in the past.  Patient does have some sinus rhythm with arrhythmia and occasional  PVCs on EKG.  EKG today is very similar to exam on February 9.  We will check a chest x-ray as well.  Final Clinical Impressions(s) / UC Diagnoses   Final diagnoses:  Chest wall pain  Irregular heart rhythm    ED Discharge Orders        Ordered    naproxen (NAPROSYN) 500 MG tablet  2 times daily     12/03/17 1350       Controlled Substance Prescriptions [place] Controlled Substance Registry consulted? Not Applicable   Candis Schatz, PA-C 12/03/17 1402

## 2017-12-03 NOTE — Discharge Instructions (Signed)
Naproxen: 1 tablet twice a day as needed for chest wall pain -EKG is similar to exam on 2/9 but no concerns for acute heart issues -xray is negative for pneumonia or other reason for chest wall pain -avoid agravating movements -can try warm compresses -follow up with PCP regarding recurrent chest wall tenderness and irregular heart beat.

## 2017-12-03 NOTE — ED Triage Notes (Signed)
Patient complains of right sided rib pain that started this morning when she woke up. Patient reports that pain is worse when taking a deep breath and when touching the area. Patient denies injury to the area.

## 2018-01-15 ENCOUNTER — Telehealth: Payer: Self-pay | Admitting: Gastroenterology

## 2018-01-15 NOTE — Telephone Encounter (Signed)
Pt is calling to get refill on rx Dexalon or Rx Protonix if insurnace doe not cover rx Dexalon send to [facility] [place]

## 2018-01-17 ENCOUNTER — Other Ambulatory Visit: Payer: Self-pay

## 2018-01-17 DIAGNOSIS — K219 Gastro-esophageal reflux disease without esophagitis: Secondary | ICD-10-CM

## 2018-01-17 MED ORDER — DEXLANSOPRAZOLE 60 MG PO CPDR: 60.0000 mg | DELAYED_RELEASE_CAPSULE | Freq: Every day | ORAL | 0 refills | Status: DC

## 2018-01-18 ENCOUNTER — Other Ambulatory Visit: Payer: Self-pay

## 2018-01-18 DIAGNOSIS — K219 Gastro-esophageal reflux disease without esophagitis: Secondary | ICD-10-CM

## 2018-01-18 MED ORDER — PANTOPRAZOLE SODIUM 40 MG PO TBEC: 40.0000 mg | DELAYED_RELEASE_TABLET | Freq: Every day | ORAL | 0 refills | Status: DC

## 2018-02-04 ENCOUNTER — Other Ambulatory Visit: Payer: Self-pay | Admitting: Gastroenterology

## 2018-02-11 DIAGNOSIS — R21 Rash and other nonspecific skin eruption: Secondary | ICD-10-CM | POA: Insufficient documentation

## 2018-02-14 ENCOUNTER — Other Ambulatory Visit: Payer: Self-pay | Admitting: Gastroenterology

## 2018-02-14 DIAGNOSIS — K219 Gastro-esophageal reflux disease without esophagitis: Secondary | ICD-10-CM

## 2018-02-21 ENCOUNTER — Telehealth: Payer: Self-pay

## 2018-02-21 ENCOUNTER — Other Ambulatory Visit: Payer: Self-pay

## 2018-02-21 NOTE — Telephone Encounter (Signed)
Advised patient that insurance denied Dexilant.   Patient stated that she had Pantoprazole with refills available.

## 2018-03-11 ENCOUNTER — Encounter: Payer: Self-pay | Admitting: Gastroenterology

## 2018-03-11 ENCOUNTER — Ambulatory Visit: Payer: Medicaid Other | Admitting: Gastroenterology

## 2018-03-11 ENCOUNTER — Telehealth: Payer: Self-pay | Admitting: Gastroenterology

## 2018-03-11 ENCOUNTER — Other Ambulatory Visit: Payer: Self-pay

## 2018-03-11 VITALS — BP 125/70 | HR 131 | Ht 62.0 in | Wt 117.0 lb

## 2018-03-11 DIAGNOSIS — R197 Diarrhea, unspecified: Secondary | ICD-10-CM | POA: Diagnosis not present

## 2018-03-11 DIAGNOSIS — K219 Gastro-esophageal reflux disease without esophagitis: Secondary | ICD-10-CM

## 2018-03-11 MED ORDER — PEG 3350-KCL-NABCB-NACL-NASULF 236 G PO SOLR: ORAL | 0 refills | Status: DC

## 2018-03-11 NOTE — Telephone Encounter (Signed)
Golytely has been sent to pt's pharmacy.

## 2018-03-11 NOTE — Telephone Encounter (Signed)
Pt  Left vm she was just in to see Dr. Servando SnareWohl she states her rx for Suprep is to expensivce  [facility] [place] [facility] [place] [facility] [place]

## 2018-03-11 NOTE — Progress Notes (Signed)
Primary Care Physician: [facility] [place] [facility]  Primary Gastroenterologist:  Dr. Midge Miniumarren Rashard Ryle  No chief complaint on file.   HPI: Vicki Dominguez is a 32 y.o. female here for follow-up of her long history of GI issues.  The patient has been seen by Dr. Mechele CollinElliott in the past and had a colonoscopy by me for abnormal CT scan.  The patient also had an upper endoscopy.  The patient has a long history of having abdominal pain after she eats.  The patient states that she has lost weight because she is afraid to eat in the past.  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  The patient was treated with Naprosyn at that time.  It was recommended at the last office visit that if the patient continued to have diarrhea then she may need a repeat colonoscopy with biopsies for microscopic colitis.  The patient has also been treated with Dexilant because she stated her Protonix did not help.  The patient recently called the office for a refill of the Dexilant but it was denied by her insurance company.  The patient reports that she also had reflux symptoms on the Dexilant and would take Tums once in a while.  Her joint pains have been in her ankles knees and hips in addition to her back.  She has continued to lose weight with her weight today being 117 and her previous weight being 123.  Current Outpatient Medications  Medication Sig Dispense Refill  . acetaminophen-codeine (TYLENOL #3) 300-30 MG tablet TAKE 1 TABLET BY MOUTH EVERY 4 TO 6 HOURS AS NEEDED FOR PAIN  0  . alprazolam (XANAX) 2 MG tablet Take 2 mg by mouth 2 (two) times daily.     . chlorhexidine (PERIDEX) 0.12 % solution SWISH AND SPIT 10 MLS TWICE A DAY  0  . DEXILANT 60 MG capsule TAKE 1 CAPSULE BY MOUTH EVERY DAY 30 capsule 6  . dicyclomine (BENTYL) 10 MG capsule Take 2 capsules (20 mg total) by mouth every 6 (six) hours 30 capsule 0  . dicyclomine  (BENTYL) 20 MG tablet TAKE 1 TABLET (20 MG TOTAL) BY MOUTH 3 (THREE) TIMES DAILY BEFORE MEALS. 90 tablet 6  . diphenoxylate-atropine (LOMOTIL) 2.5-0.025 MG tablet Take 1 tablet by mouth 4 (four) times daily as needed for diarrhea or loose stools. 60 tablet 5  . Multiple Vitamin (MULTIVITAMIN) tablet Take 1 tablet by mouth daily.     . naproxen (NAPROSYN) 500 MG tablet Take 1 tablet (500 mg total) by mouth 2 (two) times daily. 30 tablet 0  . pantoprazole (PROTONIX) 40 MG tablet Take 1 tablet (40 mg total) by mouth daily. 120 tablet 0  . promethazine (PHENERGAN) 25 MG tablet Take 25 mg by mouth every 6 (six) hours as needed for nausea or vomiting.    . sucralfate (CARAFATE) 1 GM/10ML suspension Take 10 g by mouth 4 (four) times daily as needed.     Marland Kitchen. VYVANSE 60 MG capsule Take 60 mg by mouth daily.   0   No current facility-administered medications for this visit.     Allergies as of 03/11/2018 - Review Complete 12/03/2017  Allergen Reaction Noted  . Succinylcholine chloride Other (See Comments) 03/06/2016  . Tape Rash 06/26/2016    ROS:  General: Negative for anorexia, weight loss, fever, chills, fatigue, weakness. ENT: Negative for hoarseness, difficulty swallowing , nasal congestion. CV:  Negative for chest pain, angina, palpitations, dyspnea on exertion, peripheral edema.  Respiratory: Negative for dyspnea at rest, dyspnea on exertion, cough, sputum, wheezing.  GI: See history of present illness. GU:  Negative for dysuria, hematuria, urinary incontinence, urinary frequency, nocturnal urination.  Endo: Negative for unusual weight change.    Physical Examination:   There were no vitals taken for this visit.  General: Well-nourished, well-developed in no acute distress.  Eyes: No icterus. Conjunctivae pink. Mouth: Oropharyngeal mucosa moist and pink , no lesions erythema or exudate. Lungs: Clear to auscultation bilaterally. Non-labored. Heart: Regular rate and rhythm, no murmurs  rubs or gallops.  Abdomen: Bowel sounds are normal, nontender, nondistended, no hepatosplenomegaly or masses, no abdominal bruits or hernia , no rebound or guarding.   Extremities: No lower extremity edema. No clubbing or deformities. Neuro: Alert and oriented x 3.  Grossly intact. Skin: Warm and dry, no jaundice.   Psych: Alert and cooperative, normal mood and affect.  Labs:    Imaging Studies: No results found.  Assessment and Plan:   Vicki Dominguez is a 32 y.o. y/o female who comes in today with a history of diarrhea not being help with Lomotil and heartburn that has not been helped with Dexilant.  The patient will be set up for an EGD and colonoscopy to rule out inflammatory bowel disease or microscopic colitis.  The patient will also have an upper endoscopy to rule out esophagitis.  If her upper endoscopy is negative the patient may need to have a 24-hour pH study.  If the colonoscopy is negative I will consider treating the patient with Viberzi.  The patient has been explained the plan and agrees with it.    Midge Minium, MD. Clementeen Graham   Note: This dictation was prepared with Dragon dictation along with smaller phrase technology. Any transcriptional errors that result from this process are unintentional.

## 2018-03-12 ENCOUNTER — Other Ambulatory Visit: Payer: Self-pay

## 2018-03-12 DIAGNOSIS — R000 Tachycardia, unspecified: Secondary | ICD-10-CM | POA: Insufficient documentation

## 2018-03-12 DIAGNOSIS — K219 Gastro-esophageal reflux disease without esophagitis: Secondary | ICD-10-CM

## 2018-03-12 DIAGNOSIS — R197 Diarrhea, unspecified: Secondary | ICD-10-CM

## 2018-03-13 ENCOUNTER — Other Ambulatory Visit: Payer: Self-pay

## 2018-03-13 DIAGNOSIS — R197 Diarrhea, unspecified: Secondary | ICD-10-CM

## 2018-03-13 DIAGNOSIS — K219 Gastro-esophageal reflux disease without esophagitis: Secondary | ICD-10-CM

## 2018-03-13 MED ORDER — SUCRALFATE 1 GM/10ML PO SUSP: 1.0000 g | Freq: Four times a day (QID) | ORAL | 3 refills | Status: DC | PRN

## 2018-03-13 MED ORDER — PANTOPRAZOLE SODIUM 40 MG PO TBEC: 40.0000 mg | DELAYED_RELEASE_TABLET | Freq: Every day | ORAL | 11 refills | Status: DC

## 2018-03-19 ENCOUNTER — Other Ambulatory Visit: Payer: Self-pay

## 2018-03-19 ENCOUNTER — Telehealth: Payer: Self-pay

## 2018-03-19 NOTE — Telephone Encounter (Signed)
Patient contacted office stated she thinks she may be coming down with something and her son has pneumonia.  She has been rescheduled from 06/28 to 04/04/18.  [facility] has been informed.  Thanks [employer]

## 2018-03-22 DIAGNOSIS — M419 Scoliosis, unspecified: Secondary | ICD-10-CM | POA: Insufficient documentation

## 2018-03-27 ENCOUNTER — Encounter: Payer: Self-pay | Admitting: *Deleted

## 2018-04-03 NOTE — Discharge Instructions (Signed)
General Anesthesia, Adult, Care After °These instructions provide you with information about caring for yourself after your procedure. Your health care provider may also give you more specific instructions. Your treatment has been planned according to current medical practices, but problems sometimes occur. Call your health care provider if you have any problems or questions after your procedure. °What can I expect after the procedure? °After the procedure, it is common to have: °· Vomiting. °· A sore throat. °· Mental slowness. ° °It is common to feel: °· Nauseous. °· Cold or shivery. °· Sleepy. °· Tired. °· Sore or achy, even in parts of your body where you did not have surgery. ° °Follow these instructions at home: °For at least 24 hours after the procedure: °· Do not: °? Participate in activities where you could fall or become injured. °? Drive. °? Use heavy machinery. °? Drink alcohol. °? Take sleeping pills or medicines that cause drowsiness. °? Make important decisions or sign legal documents. °? Take care of children on your own. °· Rest. °Eating and drinking °· If you vomit, drink water, juice, or soup when you can drink without vomiting. °· Drink enough fluid to keep your urine clear or pale yellow. °· Make sure you have little or no nausea before eating solid foods. °· Follow the diet recommended by your health care provider. °General instructions °· Have a responsible adult stay with you until you are awake and alert. °· Return to your normal activities as told by your health care provider. Ask your health care provider what activities are safe for you. °· Take over-the-counter and prescription medicines only as told by your health care provider. °· If you smoke, do not smoke without supervision. °· Keep all follow-up visits as told by your health care provider. This is important. °Contact a health care provider if: °· You continue to have nausea or vomiting at home, and medicines are not helpful. °· You  cannot drink fluids or start eating again. °· You cannot urinate after 8-12 hours. °· You develop a skin rash. °· You have fever. °· You have increasing redness at the site of your procedure. °Get help right away if: °· You have difficulty breathing. °· You have chest pain. °· You have unexpected bleeding. °· You feel that you are having a life-threatening or urgent problem. °This information is not intended to replace advice given to you by your health care provider. Make sure you discuss any questions you have with your health care provider. °Document Released: 12/18/2000 Document Revised: 02/14/2016 Document Reviewed: 08/26/2015 °Elsevier Interactive Patient Education © 2018 Elsevier Inc. ° °

## 2018-04-04 ENCOUNTER — Ambulatory Visit
Admission: RE | Disposition: A | Discharge status: Home / Self Care | Payer: Self-pay | Source: Ambulatory Visit | Attending: Gastroenterology

## 2018-04-04 ENCOUNTER — Ambulatory Visit: Payer: Medicaid Other | Admitting: Anesthesiology

## 2018-04-04 ENCOUNTER — Ambulatory Visit
Admission: RE | Admit: 2018-04-04 | Discharge: 2018-04-04 | Disposition: A | Discharge status: Home / Self Care | Payer: Medicaid Other | Source: Ambulatory Visit | Attending: Gastroenterology | Admitting: Gastroenterology

## 2018-04-04 DIAGNOSIS — I493 Ventricular premature depolarization: Secondary | ICD-10-CM | POA: Diagnosis not present

## 2018-04-04 DIAGNOSIS — K529 Noninfective gastroenteritis and colitis, unspecified: Secondary | ICD-10-CM | POA: Diagnosis not present

## 2018-04-04 DIAGNOSIS — R12 Heartburn: Secondary | ICD-10-CM | POA: Diagnosis not present

## 2018-04-04 DIAGNOSIS — K640 First degree hemorrhoids: Secondary | ICD-10-CM | POA: Diagnosis not present

## 2018-04-04 DIAGNOSIS — Z79899 Other long term (current) drug therapy: Secondary | ICD-10-CM | POA: Insufficient documentation

## 2018-04-04 DIAGNOSIS — M419 Scoliosis, unspecified: Secondary | ICD-10-CM | POA: Diagnosis not present

## 2018-04-04 DIAGNOSIS — M170 Bilateral primary osteoarthritis of knee: Secondary | ICD-10-CM | POA: Insufficient documentation

## 2018-04-04 DIAGNOSIS — Z8249 Family history of ischemic heart disease and other diseases of the circulatory system: Secondary | ICD-10-CM | POA: Insufficient documentation

## 2018-04-04 DIAGNOSIS — R197 Diarrhea, unspecified: Secondary | ICD-10-CM | POA: Diagnosis not present

## 2018-04-04 DIAGNOSIS — F329 Major depressive disorder, single episode, unspecified: Secondary | ICD-10-CM | POA: Insufficient documentation

## 2018-04-04 DIAGNOSIS — Z888 Allergy status to other drugs, medicaments and biological substances status: Secondary | ICD-10-CM | POA: Insufficient documentation

## 2018-04-04 DIAGNOSIS — F419 Anxiety disorder, unspecified: Secondary | ICD-10-CM | POA: Insufficient documentation

## 2018-04-04 DIAGNOSIS — K219 Gastro-esophageal reflux disease without esophagitis: Secondary | ICD-10-CM | POA: Diagnosis not present

## 2018-04-04 DIAGNOSIS — M19042 Primary osteoarthritis, left hand: Secondary | ICD-10-CM | POA: Insufficient documentation

## 2018-04-04 DIAGNOSIS — F17210 Nicotine dependence, cigarettes, uncomplicated: Secondary | ICD-10-CM | POA: Insufficient documentation

## 2018-04-04 DIAGNOSIS — M19041 Primary osteoarthritis, right hand: Secondary | ICD-10-CM | POA: Diagnosis not present

## 2018-04-04 HISTORY — DX: Motion sickness, initial encounter: T75.3XXA

## 2018-04-04 HISTORY — DX: Scoliosis, unspecified: M41.9

## 2018-04-04 HISTORY — DX: Cardiac arrhythmia, unspecified: I49.9

## 2018-04-04 HISTORY — PX: ESOPHAGOGASTRODUODENOSCOPY (EGD) WITH PROPOFOL: SHX5813

## 2018-04-04 HISTORY — PX: COLONOSCOPY WITH PROPOFOL: SHX5780

## 2018-04-04 SURGERY — COLONOSCOPY WITH PROPOFOL
Anesthesia: General | Wound class: Contaminated

## 2018-04-04 MED ORDER — ONDANSETRON 4 MG PO TBDP
4.0000 mg | ORAL_TABLET | Freq: Once | ORAL | Status: AC
  Administered 2018-04-04: 4 mg via ORAL

## 2018-04-04 MED ORDER — ACETAMINOPHEN 160 MG/5ML PO SOLN: 325.0000 mg | ORAL | Status: AC | PRN

## 2018-04-04 MED ORDER — GLYCOPYRROLATE 0.2 MG/ML IJ SOLN
INTRAMUSCULAR | Status: DC | PRN
  Administered 2018-04-04: 0.2 mg via INTRAVENOUS

## 2018-04-04 MED ORDER — SODIUM CHLORIDE 0.9 % IV SOLN: INTRAVENOUS | Status: DC

## 2018-04-04 MED ORDER — LIDOCAINE HCL (CARDIAC) PF 100 MG/5ML IV SOSY
PREFILLED_SYRINGE | INTRAVENOUS | Status: DC | PRN
  Administered 2018-04-04: 100 mg via INTRAVENOUS

## 2018-04-04 MED ORDER — ONDANSETRON HCL 4 MG PO TABS: 4.0000 mg | ORAL_TABLET | Freq: Once | ORAL | Status: DC

## 2018-04-04 MED ORDER — ONDANSETRON HCL 4 MG/2ML IJ SOLN: 4.0000 mg | Freq: Once | INTRAMUSCULAR | Status: DC | PRN

## 2018-04-04 MED ORDER — PROPOFOL 10 MG/ML IV BOLUS
INTRAVENOUS | Status: DC | PRN
  Administered 2018-04-04: 40 mg via INTRAVENOUS
  Administered 2018-04-04 (×2): 60 mg via INTRAVENOUS
  Administered 2018-04-04: 40 mg via INTRAVENOUS
  Administered 2018-04-04: 20 mg via INTRAVENOUS
  Administered 2018-04-04: 60 mg via INTRAVENOUS
  Administered 2018-04-04: 40 mg via INTRAVENOUS
  Administered 2018-04-04: 20 mg via INTRAVENOUS

## 2018-04-04 MED ORDER — ACETAMINOPHEN 325 MG PO TABS
650.0000 mg | ORAL_TABLET | Freq: Once | ORAL | Status: AC | PRN
  Administered 2018-04-04: 650 mg via ORAL

## 2018-04-04 MED ORDER — LACTATED RINGERS IV SOLN
INTRAVENOUS | Status: DC
  Administered 2018-04-04 (×2): via INTRAVENOUS

## 2018-04-04 SURGICAL SUPPLY — 36 items
BALLN DILATOR 10-12 8 (BALLOONS)
BALLN DILATOR 12-15 8 (BALLOONS)
BALLN DILATOR 15-18 8 (BALLOONS)
BALLN DILATOR CRE 0-12 8 (BALLOONS)
BALLN DILATOR ESOPH 8 10 CRE (MISCELLANEOUS) IMPLANT
BALLOON DILATOR 12-15 8 (BALLOONS) IMPLANT
BALLOON DILATOR 15-18 8 (BALLOONS) IMPLANT
BALLOON DILATOR CRE 0-12 8 (BALLOONS) IMPLANT
BLOCK BITE 60FR ADLT L/F GRN (MISCELLANEOUS) ×3 IMPLANT
CANISTER SUCT 1200ML W/VALVE (MISCELLANEOUS) ×3 IMPLANT
CLIP HMST 235XBRD CATH ROT (MISCELLANEOUS) IMPLANT
CLIP RESOLUTION 360 11X235 (MISCELLANEOUS)
ELECT REM PT RETURN 9FT ADLT (ELECTROSURGICAL)
ELECTRODE REM PT RTRN 9FT ADLT (ELECTROSURGICAL) IMPLANT
FCP ESCP3.2XJMB 240X2.8X (MISCELLANEOUS)
FORCEPS BIOP RAD 4 LRG CAP 4 (CUTTING FORCEPS) ×3 IMPLANT
FORCEPS BIOP RJ4 240 W/NDL (MISCELLANEOUS)
FORCEPS ESCP3.2XJMB 240X2.8X (MISCELLANEOUS) IMPLANT
GOWN CVR UNV OPN BCK APRN NK (MISCELLANEOUS) ×2 IMPLANT
GOWN ISOL THUMB LOOP REG UNIV (MISCELLANEOUS) ×4
INJECTOR VARIJECT VIN23 (MISCELLANEOUS) IMPLANT
KIT DEFENDO VALVE AND CONN (KITS) IMPLANT
KIT ENDO PROCEDURE OLY (KITS) ×3 IMPLANT
MARKER SPOT ENDO TATTOO 5ML (MISCELLANEOUS) IMPLANT
PROBE APC STR FIRE (PROBE) IMPLANT
RETRIEVER NET PLAT FOOD (MISCELLANEOUS) IMPLANT
RETRIEVER NET ROTH 2.5X230 LF (MISCELLANEOUS) IMPLANT
SNARE SHORT THROW 13M SML OVAL (MISCELLANEOUS) IMPLANT
SNARE SHORT THROW 30M LRG OVAL (MISCELLANEOUS) IMPLANT
SNARE SNG USE RND 15MM (INSTRUMENTS) IMPLANT
SPOT EX ENDOSCOPIC TATTOO (MISCELLANEOUS)
SYR INFLATION 60ML (SYRINGE) IMPLANT
TRAP ETRAP POLY (MISCELLANEOUS) IMPLANT
VARIJECT INJECTOR VIN23 (MISCELLANEOUS)
WATER STERILE IRR 250ML POUR (IV SOLUTION) ×3 IMPLANT
WIRE CRE 18-20MM 8CM F G (MISCELLANEOUS) IMPLANT

## 2018-04-04 NOTE — Transfer of Care (Signed)
Immediate Anesthesia Transfer of Care Note  Patient: Vicki Dominguez  Procedure(s) Performed: COLONOSCOPY WITH PROPOFOL (N/A ) ESOPHAGOGASTRODUODENOSCOPY (EGD) WITH PROPOFOL (N/A )  Patient Location: PACU  Anesthesia Type: General  Level of Consciousness: awake, alert  and patient cooperative  Airway and Oxygen Therapy: Patient Spontanous Breathing and Patient connected to supplemental oxygen  Post-op Assessment: Post-op Vital signs reviewed, Patient's Cardiovascular Status Stable, Respiratory Function Stable, Patent Airway and No signs of Nausea or vomiting  Post-op Vital Signs: Reviewed and stable  Complications: No apparent anesthesia complications

## 2018-04-04 NOTE — Op Note (Signed)
[facility] Gastroenterology Patient Name: Vicki Dominguez Procedure Date: 04/04/2018 10:53 AM MRN: [id number] Account #: [id number] Date of Birth: 1985/10/21 Admit Type: Outpatient Age: 32 Room: [facility] OR ROOM 01 Gender: Female Note Status: Finalized Procedure:            Colonoscopy Indications:          Chronic diarrhea Providers:            Midge Minium MD, MD Medicines:            Propofol per Anesthesia Complications:        No immediate complications. Procedure:            Pre-Anesthesia Assessment:                       - Prior to the procedure, a History and Physical was                        performed, and patient medications and allergies were                        reviewed. The patient's tolerance of previous                        anesthesia was also reviewed. The risks and benefits of                        the procedure and the sedation options and risks were                        discussed with the patient. All questions were                        answered, and informed consent was obtained. Prior                        Anticoagulants: The patient has taken no previous                        anticoagulant or antiplatelet agents. ASA Grade                        Assessment: II - A patient with mild systemic disease.                        After reviewing the risks and benefits, the patient was                        deemed in satisfactory condition to undergo the                        procedure.                       After obtaining informed consent, the colonoscope was                        passed under direct vision. Throughout the procedure,                        the patient's blood pressure, pulse, and oxygen  saturations were monitored continuously. The was                        introduced through the anus and advanced to the the                        terminal ileum. The colonoscopy was performed without             difficulty. The patient tolerated the procedure well.                        The quality of the bowel preparation was fair. Findings:      The perianal and digital rectal examinations were normal.      The terminal ileum appeared normal. Biopsies were taken with a cold       forceps for histology.      The colon (entire examined portion) appeared normal. Random biopsies       were obtained with cold forceps for histology randomly in the entire       colon.      Non-bleeding internal hemorrhoids were found during retroflexion. The       hemorrhoids were Grade I (internal hemorrhoids that do not prolapse). Impression:           - Preparation of the colon was fair.                       - The examined portion of the ileum was normal.                        Biopsied.                       - The entire examined colon is normal.                       - Non-bleeding internal hemorrhoids.                       - Random biopsies were obtained in the entire colon. Recommendation:       - Discharge patient to home.                       - Resume previous diet.                       - Continue present medications.                       - Await pathology results. Procedure Code(s):    --- Professional ---                       [phone], Colonoscopy, flexible; with biopsy, single or                        multiple Diagnosis Code(s):    --- Professional ---                       K52.9, Noninfective gastroenteritis and colitis,                        unspecified CPT copyright 2017 American Medical Association. All  rights reserved. The codes documented in this report are preliminary and upon coder review may  be revised to meet current compliance requirements. Midge Minium MD, MD 04/04/2018 11:23:29 AM This report has been signed electronically. Number of Addenda: 0 Note Initiated On: 04/04/2018 10:53 AM Scope Withdrawal Time: 0 hours 6 minutes 3 seconds  Total Procedure Duration: 0 hours 8  minutes 29 seconds       [facility]

## 2018-04-04 NOTE — H&P (Signed)
Midge Minium, MD [facility] [address] [place], [place] [address] Phone:[phone] Fax : [phone]  Primary Care Physician:  [facility] [place] [facility] Primary Gastroenterologist:  Dr. Servando Snare  Pre-Procedure History & Physical: HPI:  Vicki Dominguez is a 32 y.o. female is here for an endoscopy and colonoscopy.   Past Medical History:  Diagnosis Date  . Anxiety   . Arthritis    hands, legs  . Colitis   . Depression   . Dysrhythmia    PCP wants her to have wear a heart monitor  . Family history of adverse reaction to anesthesia    Mother difficult to awaken after Succinycholine, son had PONV  . GERD (gastroesophageal reflux disease)   . Migraine    Rare.  Tension HA 3x/wk  . Motion sickness   . Ovarian cyst   . Scoliosis   . TMJ arthritis   . TMJ syndrome     Past Surgical History:  Procedure Laterality Date  . COLONOSCOPY WITH PROPOFOL N/A 07/03/2016   Procedure: COLONOSCOPY WITH PROPOFOL;  Surgeon: Midge Minium, MD;  Location: [facility];  Service: Endoscopy;  Laterality: N/A;  . ESOPHAGOGASTRODUODENOSCOPY N/A 03/06/2016   Procedure: ESOPHAGOGASTRODUODENOSCOPY (EGD);  Surgeon: Scot Jun, MD;  Location: [facility] ENDOSCOPY;  Service: Endoscopy;  Laterality: N/A;  . LAPAROSCOPIC OVARIAN CYSTECTOMY Left 03/07/2016   Procedure: LAPAROSCOPIC OVARIAN CYSTECTOMY;  Surgeon: Conard Novak, MD;  Location: [facility] ORS;  Service: Gynecology;  Laterality: Left;  . OVARIAN CYST REMOVAL    . TEMPOROMANDIBULAR JOINT SURGERY      Prior to Admission medications   Medication Sig Start Date End Date Taking? Authorizing Provider  alprazolam Prudy Feeler) 2 MG tablet Take 2 mg by mouth 2 (two) times daily.    Yes [provider]  celecoxib (CELEBREX) 100 MG capsule TAKE 1 CAPSULE (100 MG TOTAL) BY MOUTH TWO (2) TIMES A DAY. 02/11/18  Yes [provider]  dicyclomine (BENTYL) 20 MG tablet TAKE 1 TABLET (20 MG TOTAL) BY MOUTH 3 (THREE) TIMES DAILY BEFORE MEALS. 02/05/18   Yes Midge Minium, MD  diphenoxylate-atropine (LOMOTIL) 2.5-0.025 MG tablet Take 1 tablet by mouth 4 (four) times daily as needed for diarrhea or loose stools. 09/26/17  Yes Midge Minium, MD  Multiple Vitamin (MULTIVITAMIN) tablet Take 1 tablet by mouth daily.    Yes [provider]  pantoprazole (PROTONIX) 40 MG tablet Take 1 tablet (40 mg total) by mouth daily. 03/13/18 07/11/18 Yes Midge Minium, MD  polyethylene glycol (GOLYTELY) 236 g solution Drink one 8 oz glass every 20 mins until the entire container is finished. 03/11/18  Yes Midge Minium, MD  promethazine (PHENERGAN) 25 MG tablet Take 25 mg by mouth every 6 (six) hours as needed for nausea or vomiting.   Yes [provider]  sucralfate (CARAFATE) 1 GM/10ML suspension Take 10 mLs (1 g total) by mouth 4 (four) times daily as needed. 03/13/18  Yes Midge Minium, MD  VYVANSE 60 MG capsule Take 60 mg by mouth daily.  07/23/17  Yes [provider]  buprenorphine (SUBUTEX) 8 MG SUBL SL tablet Place under the tongue. 02/04/16   [provider]  chlorhexidine (PERIDEX) 0.12 % solution SWISH AND SPIT 10 MLS TWICE A DAY 10/17/17   [provider]  clobetasol ointment (TEMOVATE) 0.05 % APPLY TO AFFECTED AREA TWICE A DAY 02/11/18   [provider]  DEXILANT 60 MG capsule TAKE 1 CAPSULE BY MOUTH EVERY DAY Patient not taking: Reported on 03/11/2018 02/21/18  Midge Minium, MD  dicyclomine (BENTYL) 10 MG capsule Take 2 capsules (20 mg total) by mouth every 6 (six) hours Patient taking differently: 10 mg. Take 2 capsules (20 mg total) by mouth every 6 (six) hours 08/31/17 11/03/17  Midge Minium, MD  ipratropium (ATROVENT) 0.06 % nasal spray PLACE 2 SPRAYS INTO BOTH NOSTRILS 3 (THREE) TIMES DAILY 01/05/18   [provider]  lamoTRIgine (LAMICTAL) 100 MG tablet  03/01/18   [provider]  naproxen (NAPROSYN) 500 MG tablet Take 1 tablet (500 mg total) by mouth 2 (two) times daily. Patient not taking:  Reported on 03/11/2018 12/03/17   Candis Schatz, PA-C    Allergies as of 03/12/2018 - Review Complete 03/11/2018  Allergen Reaction Noted  . Succinylcholine chloride Other (See Comments) 03/06/2016  . Tape Rash 06/26/2016    Family History  Problem Relation Age of Onset  . Heart attack Mother   . Hypertension Father     Social History   Socioeconomic History  . Marital status: Single    Spouse name: Not on file  . Number of children: Not on file  . Years of education: Not on file  . Highest education level: Not on file  Occupational History  . Not on file  Social Needs  . Financial resource strain: Not on file  . Food insecurity:    Worry: Not on file    Inability: Not on file  . Transportation needs:    Medical: Not on file    Non-medical: Not on file  Tobacco Use  . Smoking status: Current Every Day Smoker    Packs/day: 0.75    Years: 10.00    Pack years: 7.50    Types: Cigarettes  . Smokeless tobacco: Never Used  Substance and Sexual Activity  . Alcohol use: Yes    Alcohol/week: 0.0 oz    Comment: occasionally  . Drug use: No  . Sexual activity: Yes  Lifestyle  . Physical activity:    Days per week: Not on file    Minutes per session: Not on file  . Stress: Not on file  Relationships  . Social connections:    Talks on phone: Not on file    Gets together: Not on file    Attends religious service: Not on file    Active member of club or organization: Not on file    Attends meetings of clubs or organizations: Not on file    Relationship status: Not on file  . Intimate partner violence:    Fear of current or ex partner: Not on file    Emotionally abused: Not on file    Physically abused: Not on file    Forced sexual activity: Not on file  Other Topics Concern  . Not on file  Social History Narrative  . Not on file    Review of Systems: See HPI, otherwise negative ROS  Physical Exam: BP (!) 128/94   Pulse 94   Temp 98.1 F (36.7 C)   Resp  16   Ht 5\' 2"  (1.575 m)   Wt 116 lb (52.6 kg)   LMP 03/14/2018 (Approximate) Comment: preg test neg  SpO2 100%   BMI 21.22 kg/m  General:   Alert,  pleasant and cooperative in NAD Head:  Normocephalic and atraumatic. Neck:  Supple; no masses or thyromegaly. Lungs:  Clear throughout to auscultation.    Heart:  Regular rate and rhythm. Abdomen:  Soft, nontender and nondistended. Normal bowel sounds, without guarding, and  without rebound.   Neurologic:  Alert and  oriented x4;  grossly normal neurologically.  Impression/Plan: Drema Halonshley D Mirarchi is here for an endoscopy and colonoscopy to be performed for diarrhea and GERD  Risks, benefits, limitations, and alternatives regarding  endoscopy and colonoscopy have been reviewed with the patient.  Questions have been answered.  All parties agreeable.   Midge Miniumarren Jakyle Petrucelli, MD  04/04/2018, 10:05 AM

## 2018-04-04 NOTE — Op Note (Signed)
[facility] Gastroenterology Patient Name: Vicki Dominguez Procedure Date: 04/04/2018 10:54 AM MRN: [id number] Account #: [id number] Date of Birth: 1986-03-07 Admit Type: Outpatient Age: 32 Room: [facility] OR ROOM 01 Gender: Female Note Status: Finalized Procedure:            Upper GI endoscopy Indications:          Heartburn, Diarrhea Providers:            Midge Minium MD, MD Medicines:            Propofol per Anesthesia Complications:        No immediate complications. Procedure:            Pre-Anesthesia Assessment:                       - Prior to the procedure, a History and Physical was                        performed, and patient medications and allergies were                        reviewed. The patient's tolerance of previous                        anesthesia was also reviewed. The risks and benefits of                        the procedure and the sedation options and risks were                        discussed with the patient. All questions were                        answered, and informed consent was obtained. Prior                        Anticoagulants: The patient has taken no previous                        anticoagulant or antiplatelet agents. ASA Grade                        Assessment: II - A patient with mild systemic disease.                        After reviewing the risks and benefits, the patient was                        deemed in satisfactory condition to undergo the                        procedure.                       After obtaining informed consent, the endoscope was                        passed under direct vision. Throughout the procedure,                        the patient's blood pressure, pulse,  and oxygen                        saturations were monitored continuously. The was                        introduced through the mouth, and advanced to the                        second part of duodenum. The upper GI endoscopy was                 accomplished without difficulty. The patient tolerated                        the procedure well. Findings:      The examined esophagus was normal.      Localized minimal inflammation characterized by erythema was found in       the gastric antrum. Biopsies were taken with a cold forceps for       histology.      The examined duodenum was normal. Biopsies were taken with a cold       forceps for histology. Impression:           - Normal esophagus.                       - Gastritis. Biopsied.                       - Normal examined duodenum. Biopsied. Recommendation:       - Discharge patient to home.                       - Resume previous diet.                       - Continue present medications.                       - Await pathology results.                       - Perform a colonoscopy today. Procedure Code(s):    --- Professional ---                       [phone], Esophagogastroduodenoscopy, flexible, transoral;                        with biopsy, single or multiple Diagnosis Code(s):    --- Professional ---                       R19.7, Diarrhea, unspecified                       R12, Heartburn                       K29.70, Gastritis, unspecified, without bleeding CPT copyright 2017 American Medical Association. All rights reserved. The codes documented in this report are preliminary and upon coder review may  be revised to meet current compliance requirements. Midge Minium MD, MD 04/04/2018 11:11:40 AM This report has been signed electronically. Number of Addenda: 0 Note Initiated On: 04/04/2018 10:54 AM Total Procedure  Duration: 0 hours 7 minutes 3 seconds       [facility]

## 2018-04-04 NOTE — Anesthesia Postprocedure Evaluation (Signed)
Anesthesia Post Note  Patient: Vicki Dominguez  Procedure(s) Performed: COLONOSCOPY WITH biopsies (N/A ) ESOPHAGOGASTRODUODENOSCOPY (EGD) WITH biopsies (N/A )  Patient location during evaluation: PACU Anesthesia Type: General Level of consciousness: awake and alert, oriented and patient cooperative Pain management: pain level controlled Vital Signs Assessment: post-procedure vital signs reviewed and stable Respiratory status: spontaneous breathing, nonlabored ventilation and respiratory function stable Cardiovascular status: blood pressure returned to baseline and stable Postop Assessment: adequate PO intake Anesthetic complications: no    Reed BreechAndrea Carmesha Morocco

## 2018-04-04 NOTE — Anesthesia Preprocedure Evaluation (Signed)
Anesthesia Evaluation  Patient identified by MRN, date of birth, ID band Patient awake    Reviewed: Allergy & Precautions, NPO status , Patient's Chart, lab work & pertinent test results  History of Anesthesia Complications (+) Family history of anesthesia reaction and history of anesthetic complications (mother difficult to awaken after succinylcholine)  Airway Mallampati: I  TM Distance: >3 FB Neck ROM: Full    Dental  (+)    Pulmonary Current Smoker (1 ppd),    Pulmonary exam normal breath sounds clear to auscultation       Cardiovascular Exercise Tolerance: Good Normal cardiovascular exam Rhythm:Regular Rate:Normal  PVCs   Neuro/Psych  Headaches, PSYCHIATRIC DISORDERS Anxiety Depression    GI/Hepatic GERD  ,Colitis    Endo/Other  negative endocrine ROS  Renal/GU negative Renal ROS     Musculoskeletal  (+) Arthritis , Osteoarthritis,    Abdominal   Peds  Hematology negative hematology ROS (+)   Anesthesia Other Findings   Reproductive/Obstetrics                             Anesthesia Physical Anesthesia Plan  ASA: II  Anesthesia Plan: General   Post-op Pain Management:    Induction: Intravenous  PONV Risk Score and Plan: 2 and Propofol infusion and TIVA  Airway Management Planned: Natural Airway  Additional Equipment:   Intra-op Plan:   Post-operative Plan:   Informed Consent: I have reviewed the patients History and Physical, chart, labs and discussed the procedure including the risks, benefits and alternatives for the proposed anesthesia with the patient or authorized representative who has indicated his/her understanding and acceptance.     Plan Discussed with: CRNA  Anesthesia Plan Comments:         Anesthesia Quick Evaluation

## 2018-04-04 NOTE — Anesthesia Procedure Notes (Signed)
Procedure Name: MAC Date/Time: 04/04/2018 10:59 AM Performed by: Lind Guest, CRNA Pre-anesthesia Checklist: Patient identified, Emergency Drugs available, Suction available, Patient being monitored and Timeout performed Patient Re-evaluated:Patient Re-evaluated prior to induction Oxygen Delivery Method: Nasal cannula

## 2018-04-05 ENCOUNTER — Telehealth: Payer: Self-pay | Admitting: Gastroenterology

## 2018-04-05 ENCOUNTER — Other Ambulatory Visit: Payer: Self-pay

## 2018-04-05 ENCOUNTER — Encounter: Payer: Self-pay | Admitting: Gastroenterology

## 2018-04-05 DIAGNOSIS — K219 Gastro-esophageal reflux disease without esophagitis: Secondary | ICD-10-CM

## 2018-04-05 MED ORDER — DIPHENOXYLATE-ATROPINE 2.5-0.025 MG PO TABS: 1.0000 | ORAL_TABLET | Freq: Four times a day (QID) | ORAL | 5 refills | Status: DC | PRN

## 2018-04-05 MED ORDER — PANTOPRAZOLE SODIUM 40 MG PO TBEC: 40.0000 mg | DELAYED_RELEASE_TABLET | Freq: Two times a day (BID) | ORAL | 6 refills | Status: DC

## 2018-04-05 NOTE — Telephone Encounter (Signed)
Pt left vm she had procedure  And Dr. Servando SnareWohl told her to take rx Protonix  2 times a day she needs refill  Also needs refill on rx [place]

## 2018-04-05 NOTE — Telephone Encounter (Signed)
Rx for Pantoprazole and Lomotil have been called in to pt's pharmacy per her request.

## 2018-04-12 ENCOUNTER — Telehealth: Payer: Self-pay

## 2018-04-12 NOTE — Telephone Encounter (Signed)
-----   Message from Midge Miniumarren Wohl, MD sent at 04/10/2018  6:12 AM EDT ----- The patient know that her biopsies were normal. If her diarrhea is persisting we had discussed treating her with Verbrozi.

## 2018-04-12 NOTE — Telephone Encounter (Signed)
Pt notified of results. Will take Viberzi 75mg  samples for pt to try prior to prescribing this medication. Pt will pick up in [place] Monday.

## 2018-04-15 NOTE — Telephone Encounter (Signed)
[facility].

## 2018-06-24 DIAGNOSIS — M549 Dorsalgia, unspecified: Secondary | ICD-10-CM | POA: Insufficient documentation

## 2018-06-26 ENCOUNTER — Other Ambulatory Visit: Payer: Self-pay

## 2018-06-26 ENCOUNTER — Encounter: Payer: Self-pay | Admitting: Emergency Medicine

## 2018-06-26 ENCOUNTER — Emergency Department: Payer: Medicaid Other

## 2018-06-26 ENCOUNTER — Emergency Department
Admission: EM | Admit: 2018-06-26 | Discharge: 2018-06-26 | Disposition: A | Discharge status: Home / Self Care | Payer: Medicaid Other | Attending: Emergency Medicine | Admitting: Emergency Medicine

## 2018-06-26 DIAGNOSIS — F1120 Opioid dependence, uncomplicated: Secondary | ICD-10-CM | POA: Insufficient documentation

## 2018-06-26 DIAGNOSIS — R0789 Other chest pain: Secondary | ICD-10-CM | POA: Diagnosis not present

## 2018-06-26 DIAGNOSIS — F17210 Nicotine dependence, cigarettes, uncomplicated: Secondary | ICD-10-CM | POA: Insufficient documentation

## 2018-06-26 DIAGNOSIS — F329 Major depressive disorder, single episode, unspecified: Secondary | ICD-10-CM | POA: Diagnosis not present

## 2018-06-26 DIAGNOSIS — R05 Cough: Secondary | ICD-10-CM

## 2018-06-26 DIAGNOSIS — Z79899 Other long term (current) drug therapy: Secondary | ICD-10-CM | POA: Diagnosis not present

## 2018-06-26 DIAGNOSIS — M542 Cervicalgia: Secondary | ICD-10-CM | POA: Diagnosis present

## 2018-06-26 DIAGNOSIS — R059 Cough, unspecified: Secondary | ICD-10-CM

## 2018-06-26 DIAGNOSIS — F419 Anxiety disorder, unspecified: Secondary | ICD-10-CM | POA: Diagnosis not present

## 2018-06-26 LAB — BASIC METABOLIC PANEL
ANION GAP: 8 (ref 5–15)
BUN: 13 mg/dL (ref 6–20)
CALCIUM: 9.2 mg/dL (ref 8.9–10.3)
CO2: 25 mmol/L (ref 22–32)
Chloride: 108 mmol/L (ref 98–111)
Creatinine, Ser: 0.74 mg/dL (ref 0.44–1.00)
GFR calc Af Amer: >60 mL/min (ref >60)
Glucose, Bld: 125 mg/dL — ABNORMAL HIGH (ref 70–99)
POTASSIUM: 3.9 mmol/L (ref 3.5–5.1)
SODIUM: 141 mmol/L (ref 135–145)

## 2018-06-26 LAB — CBC
HCT: 41.6 % (ref 35.0–47.0)
Hemoglobin: 14.3 g/dL (ref 12.0–16.0)
MCH: 30.5 pg (ref 26.0–34.0)
MCHC: 34.3 g/dL (ref 32.0–36.0)
MCV: 88.8 fL (ref 80.0–100.0)
Platelets: 170 10*3/uL (ref 150–440)
RBC: 4.69 MIL/uL (ref 3.80–5.20)
RDW: 14 % (ref 11.5–14.5)
WBC: 5.6 10*3/uL (ref 3.6–11.0)

## 2018-06-26 LAB — TROPONIN I

## 2018-06-26 LAB — POCT PREGNANCY, URINE: PREG TEST UR: NEGATIVE

## 2018-06-26 NOTE — ED Notes (Signed)
Pt ambulatory to POV without difficulty. VSS. NAD. Discharge instructions, RX and follow up reviewed. All questions and concerns addressed.  

## 2018-06-26 NOTE — Discharge Instructions (Addendum)
Given your history of stomach problems I would recommend that you use Tylenol for this pain.  If you have fever, chills, worsening chest pain, shortness of breath, pain when he breathes or other concerns return to the emergency room.  Please stop smoking.

## 2018-06-26 NOTE — ED Provider Notes (Signed)
[facility] Emergency Department Provider Note  ____________________________________________   I have reviewed the triage vital signs and the nursing notes. Where available I have reviewed prior notes and, if possible and indicated, outside hospital notes.    HISTORY  Chief Complaint Chest Pain    HPI Vicki Dominguez is a 32 y.o. female  Who presents today complaining of pain to the left side of her neck since she woke up after sleeping wrong couple days ago.  She also has a cough.  Cough is nonproductive.  She denies any fever chills, no exertional pain.,  Has tried Tylenol which seems to help but then gets worse again.  She has not had any actual chest pain, is really a neck pain that goes towards her chest its in the trapezius muscle as she indicates.  She denies any weakness, she denies any neck pain, she has had no recent injury, no recent travel, no leg swelling, she has no history of DVT or pleuritic pain.    Past Medical History:  Diagnosis Date  . Anxiety   . Arthritis    hands, legs  . Colitis   . Depression   . Dysrhythmia    PCP wants her to have wear a heart monitor  . Family history of adverse reaction to anesthesia    Mother difficult to awaken after Succinycholine, son had PONV  . GERD (gastroesophageal reflux disease)   . Migraine    Rare.  Tension HA 3x/wk  . Motion sickness   . Ovarian cyst   . Scoliosis   . TMJ arthritis   . TMJ syndrome     Patient Active Problem List   Diagnosis Date Noted  . Diarrhea   . Rash 02/11/2018  . Chest wall pain 12/03/2017  . Opioid use disorder, severe, dependence (HCC) 01/24/2017  . Tobacco use disorder 01/24/2017  . Cannabis use disorder, moderate, dependence (HCC) 01/24/2017  . Severe recurrent major depression without psychotic features (HCC) 01/23/2017  . GERD (gastroesophageal reflux disease) 02/28/2016  . Depression 05/20/2012    Past Surgical History:  Procedure Laterality Date   . COLONOSCOPY WITH PROPOFOL N/A 07/03/2016   Procedure: COLONOSCOPY WITH PROPOFOL;  Surgeon: Midge Minium, MD;  Location: [facility];  Service: Endoscopy;  Laterality: N/A;  . COLONOSCOPY WITH PROPOFOL N/A 04/04/2018   Procedure: COLONOSCOPY WITH biopsies;  Surgeon: Midge Minium, MD;  Location: [facility];  Service: Endoscopy;  Laterality: N/A;  . ESOPHAGOGASTRODUODENOSCOPY N/A 03/06/2016   Procedure: ESOPHAGOGASTRODUODENOSCOPY (EGD);  Surgeon: Scot Jun, MD;  Location: [facility];  Service: Endoscopy;  Laterality: N/A;  . ESOPHAGOGASTRODUODENOSCOPY (EGD) WITH PROPOFOL N/A 04/04/2018   Procedure: ESOPHAGOGASTRODUODENOSCOPY (EGD) WITH biopsies;  Surgeon: Midge Minium, MD;  Location: [facility];  Service: Endoscopy;  Laterality: N/A;  . LAPAROSCOPIC OVARIAN CYSTECTOMY Left 03/07/2016   Procedure: LAPAROSCOPIC OVARIAN CYSTECTOMY;  Surgeon: Conard Novak, MD;  Location: [facility] ORS;  Service: Gynecology;  Laterality: Left;  . OVARIAN CYST REMOVAL    . TEMPOROMANDIBULAR JOINT SURGERY      Prior to Admission medications   Medication Sig Start Date End Date Taking? Authorizing Provider  alprazolam Prudy Feeler) 2 MG tablet Take 2 mg by mouth 2 (two) times daily.     [provider]  buprenorphine (SUBUTEX) 8 MG SUBL SL tablet Place under the tongue. 02/04/16   [provider]  celecoxib (CELEBREX) 100 MG capsule TAKE 1 CAPSULE (100 MG TOTAL) BY MOUTH TWO (2) TIMES A DAY. 02/11/18  [provider]  chlorhexidine (PERIDEX) 0.12 % solution SWISH AND SPIT 10 MLS TWICE A DAY 10/17/17   [provider]  clobetasol ointment (TEMOVATE) 0.05 % APPLY TO AFFECTED AREA TWICE A DAY 02/11/18   [provider]  DEXILANT 60 MG capsule TAKE 1 CAPSULE BY MOUTH EVERY DAY Patient not taking: Reported on 03/11/2018 02/21/18   Midge Minium, MD  dicyclomine (BENTYL) 10 MG capsule Take 2 capsules (20 mg total) by mouth every 6 (six) hours Patient taking  differently: 10 mg. Take 2 capsules (20 mg total) by mouth every 6 (six) hours 08/31/17 11/03/17  Midge Minium, MD  dicyclomine (BENTYL) 20 MG tablet TAKE 1 TABLET (20 MG TOTAL) BY MOUTH 3 (THREE) TIMES DAILY BEFORE MEALS. 02/05/18   Midge Minium, MD  diphenoxylate-atropine (LOMOTIL) 2.5-0.025 MG tablet Take 1 tablet by mouth 4 (four) times daily as needed for diarrhea or loose stools. 04/05/18   Midge Minium, MD  ipratropium (ATROVENT) 0.06 % nasal spray PLACE 2 SPRAYS INTO BOTH NOSTRILS 3 (THREE) TIMES DAILY 01/05/18   [provider]  lamoTRIgine (LAMICTAL) 100 MG tablet  03/01/18   [provider]  Multiple Vitamin (MULTIVITAMIN) tablet Take 1 tablet by mouth daily.     [provider]  naproxen (NAPROSYN) 500 MG tablet Take 1 tablet (500 mg total) by mouth 2 (two) times daily. Patient not taking: Reported on 03/11/2018 12/03/17   Candis Schatz, PA-C  pantoprazole (PROTONIX) 40 MG tablet Take 1 tablet (40 mg total) by mouth 2 (two) times daily. 04/05/18   Midge Minium, MD  polyethylene glycol (GOLYTELY) 236 g solution Drink one 8 oz glass every 20 mins until the entire container is finished. 03/11/18   Midge Minium, MD  promethazine (PHENERGAN) 25 MG tablet Take 25 mg by mouth every 6 (six) hours as needed for nausea or vomiting.    [provider]  sucralfate (CARAFATE) 1 GM/10ML suspension Take 10 mLs (1 g total) by mouth 4 (four) times daily as needed. 03/13/18   Midge Minium, MD  VYVANSE 60 MG capsule Take 60 mg by mouth daily.  07/23/17   [provider]    Allergies Succinylcholine chloride and Tape  Family History  Problem Relation Age of Onset  . Heart attack Mother   . Hypertension Father     Social History Social History   Tobacco Use  . Smoking status: Current Every Day Smoker    Packs/day: 0.75    Years: 10.00    Pack years: 7.50    Types: Cigarettes  . Smokeless tobacco: Never Used  Substance Use Topics  . Alcohol use: Yes     Alcohol/week: 0.0 standard drinks    Comment: occasionally  . Drug use: No    Review of Systems Constitutional: No fever/chills Eyes: No visual changes. ENT: No sore throat. No stiff neck no neck pain Cardiovascular: Denies chest pain. Respiratory: Denies shortness of breath.  Slight nonproductive "smoker's cough", chronic Gastrointestinal:   no vomiting.  No diarrhea.  No constipation. Genitourinary: Negative for dysuria. Musculoskeletal: Negative lower extremity swelling Skin: Negative for rash. Neurological: Negative for severe headaches, focal weakness or numbness.   ____________________________________________   PHYSICAL EXAM:  VITAL SIGNS: ED Triage Vitals  Enc Vitals Group     BP 06/26/18 1606 (!) 129/91     Pulse Rate 06/26/18 1606 97     Resp 06/26/18 1606 16     Temp 06/26/18 1606 98.9 F (37.2 C)     Temp  Source 06/26/18 1606 Oral     SpO2 06/26/18 1606 100 %     Weight 06/26/18 1604 119 lb (54 kg)     Height 06/26/18 1604 5\' 2"  (1.575 m)     Head Circumference --      Peak Flow --      Pain Score 06/26/18 1603 5     Pain Loc --      Pain Edu? --      Excl. in GC? --     Constitutional: Alert and oriented. Well appearing and in no acute distress. Eyes: Conjunctivae are normal Head: Atraumatic HEENT: No congestion/rhinnorhea. Mucous membranes are moist.  Oropharynx non-erythematous Neck:   Tenderness to palpation of the left trapezius region with palpation, also with ranging of the neck.  There is no meningismus.  There is no erythema no masses.  Reproducible pain however when I touch this area in the trapezius muscle she states "ouch that the pain right there".  No meningismus, no masses, no stridor Cardiovascular: Normal rate, regular rhythm. Grossly normal heart sounds.  Good peripheral circulation. Respiratory: Normal respiratory effort.  No retractions. Lungs CTAB. Abdominal: Soft and nontender. No distention. No guarding no rebound Back:  There is no  focal tenderness or step off.  there is no midline tenderness there are no lesions noted. there is no CVA tenderness Musculoskeletal: No lower extremity tenderness, no upper extremity tenderness. No joint effusions, no DVT signs strong distal pulses no edema Neurologic:  Normal speech and language. No gross focal neurologic deficits are appreciated.  Skin:  Skin is warm, dry and intact. No rash noted. Psychiatric: Mood and affect are normal. Speech and behavior are normal.  ____________________________________________   LABS (all labs ordered are listed, but only abnormal results are displayed)  Labs Reviewed  BASIC METABOLIC PANEL - Abnormal; Notable for the following components:      Result Value   Glucose, Bld 125 (*)    All other components within normal limits  CBC  TROPONIN I  POCT PREGNANCY, URINE  POC URINE PREG, ED    Pertinent labs  results that were available during my care of the patient were reviewed by me and considered in my medical decision making (see chart for details). ____________________________________________  EKG  I personally interpreted any EKGs ordered by me or triage This rhythm rate 99 bpm no acute ST elevation or depression, PAC noted, no acute ischemia ____________________________________________  RADIOLOGY  Pertinent labs & imaging results that were available during my care of the patient were reviewed by me and considered in my medical decision making (see chart for details). If possible, patient and/or family made aware of any abnormal findings.  Dg Chest 2 View  Result Date: 06/26/2018 CLINICAL DATA:  Left-sided chest pain. EXAM: CHEST - 2 VIEW COMPARISON:  December 03, 2017 FINDINGS: The heart size and mediastinal contours are within normal limits. Both lungs are clear. The visualized skeletal structures are unremarkable. IMPRESSION: No active cardiopulmonary disease. Electronically Signed   By: Gerome Sam III M.D   On: 06/26/2018 16:32    ____________________________________________    PROCEDURES  Procedure(s) performed: None  Procedures  Critical Care performed: None  ____________________________________________   INITIAL IMPRESSION / ASSESSMENT AND PLAN / ED COURSE  Pertinent labs & imaging results that were available during my care of the patient were reviewed by me and considered in my medical decision making (see chart for details).  She is here with very reducible trapezius muscle pain.  At  this time, there does not appear to be clinical evidence to support the diagnosis of pulmonary embolus, dissection, myocarditis, endocarditis, pericarditis, pericardial tamponade, acute coronary syndrome, pneumothorax, pneumonia, or any other acute intrathoracic pathology that will require admission or acute intervention. Nor is there evidence of any significant intra-abdominal pathology causing this discomfort.  Patient very comfortable with the plan for discharge, she just wanted reassurance.  I did spend counseling her about tobacco abuse, in addition, patient already has outpatient follow-up with cardiology because of prior events like this.  I think this reproducible trapezius muscle discomfort represents a PE.  Patient very comfortable with the plan to discharge and follow close with PCP return precautions follow-up given understood.    ____________________________________________   FINAL CLINICAL IMPRESSION(S) / ED DIAGNOSES  Final diagnoses:  Chest wall pain  Cough      This chart was dictated using voice recognition software.  Despite best efforts to proofread,  errors can occur which can change meaning.       Jeanmarie Plant, MD 06/26/18 [phone]

## 2018-06-26 NOTE — ED Triage Notes (Signed)
Pt in via POV with complaints of left side chest pain with radiation into left shoulder and arm.  Pt reports pain started "a few days ago with pain in my neck and has just gotten worse."  Pt ambulatory to triage.  NAD noted at this time.

## 2018-07-14 IMAGING — CR DG CHEST 2V
1 series · 2 of 2 positions shown · non-contrast
Comparison: 10/07/2016

CLINICAL DATA: Patient reports chest wall pain and SOB onset 2 days
ago. Reports symptoms have been worsening over past 2 days. Hx
irregular heartbeat. Current smoker.

EXAM:
CHEST  2 VIEW

[Series 1: dg chest 2 view · 0.14mm/px · 2 of 2 slices shown]
[im 1/2]
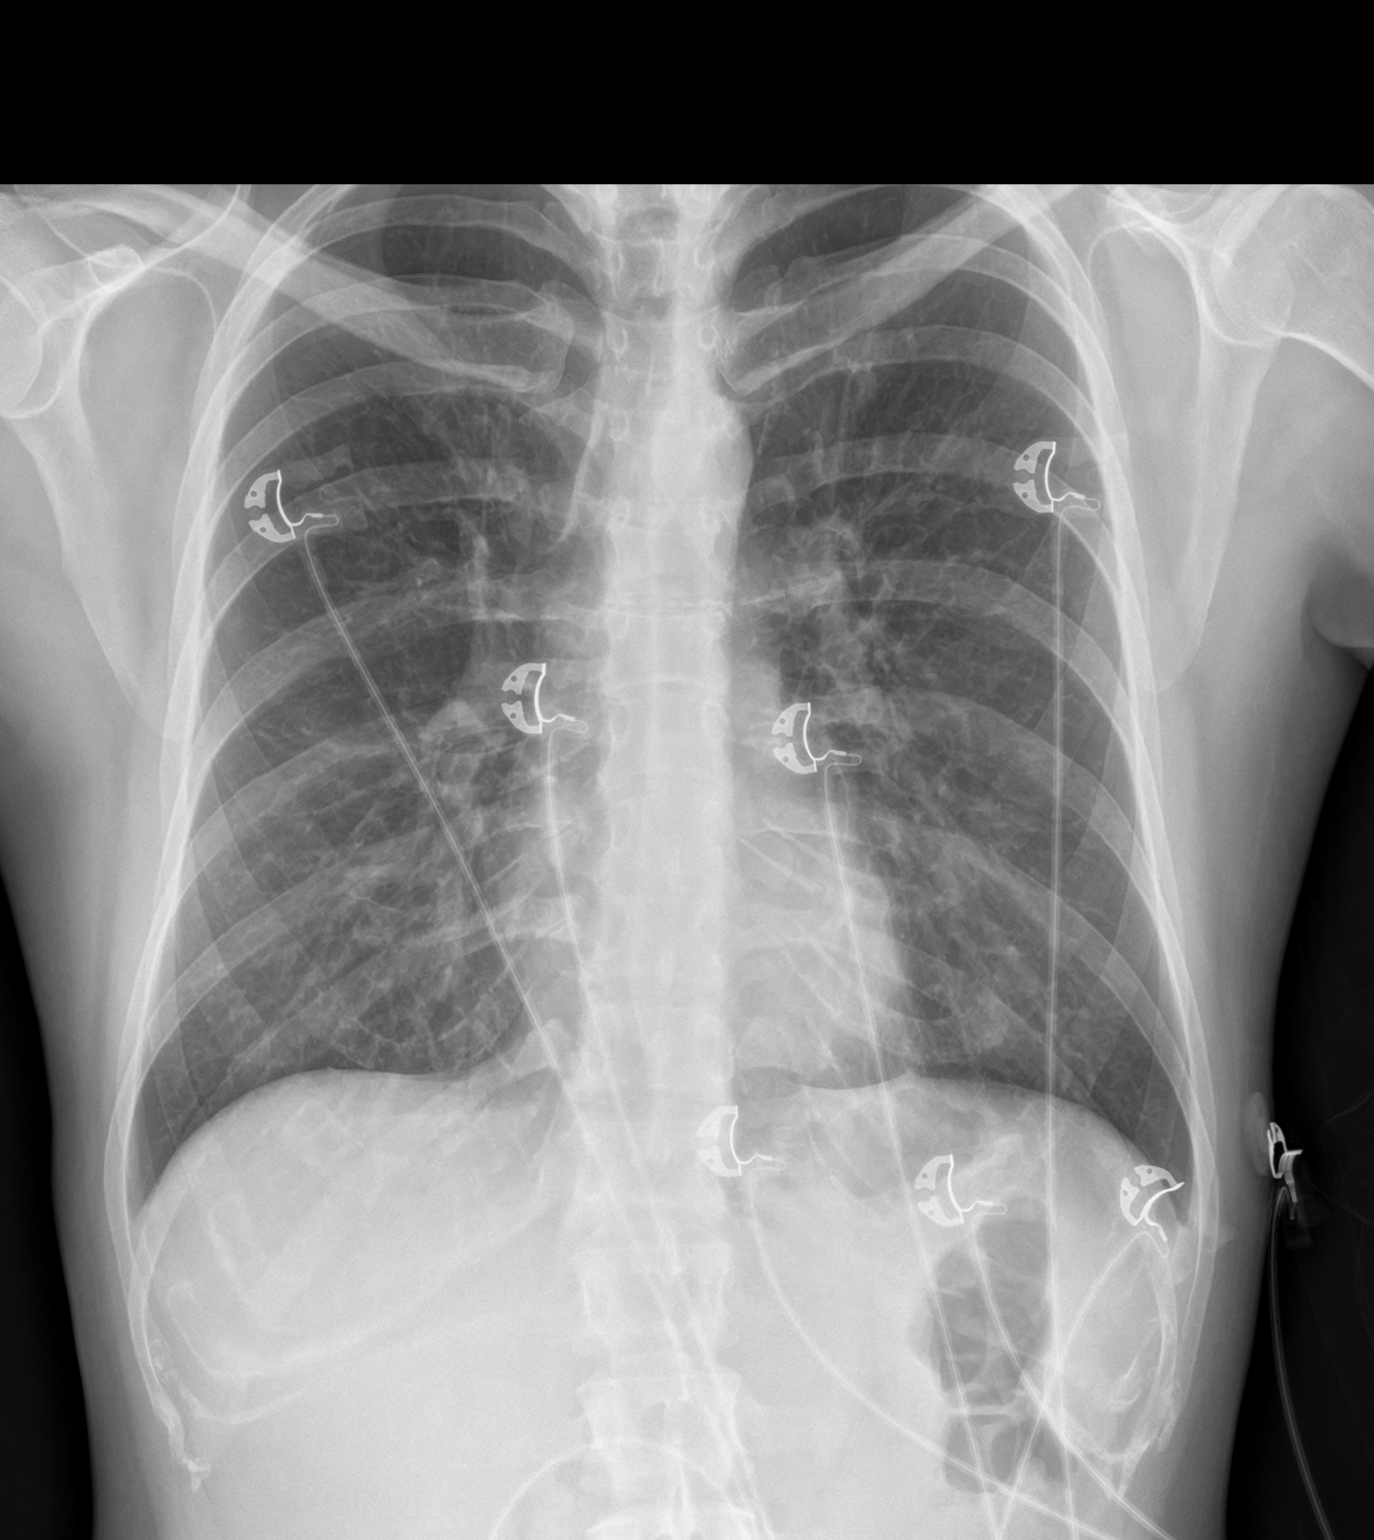
[im 2/2]
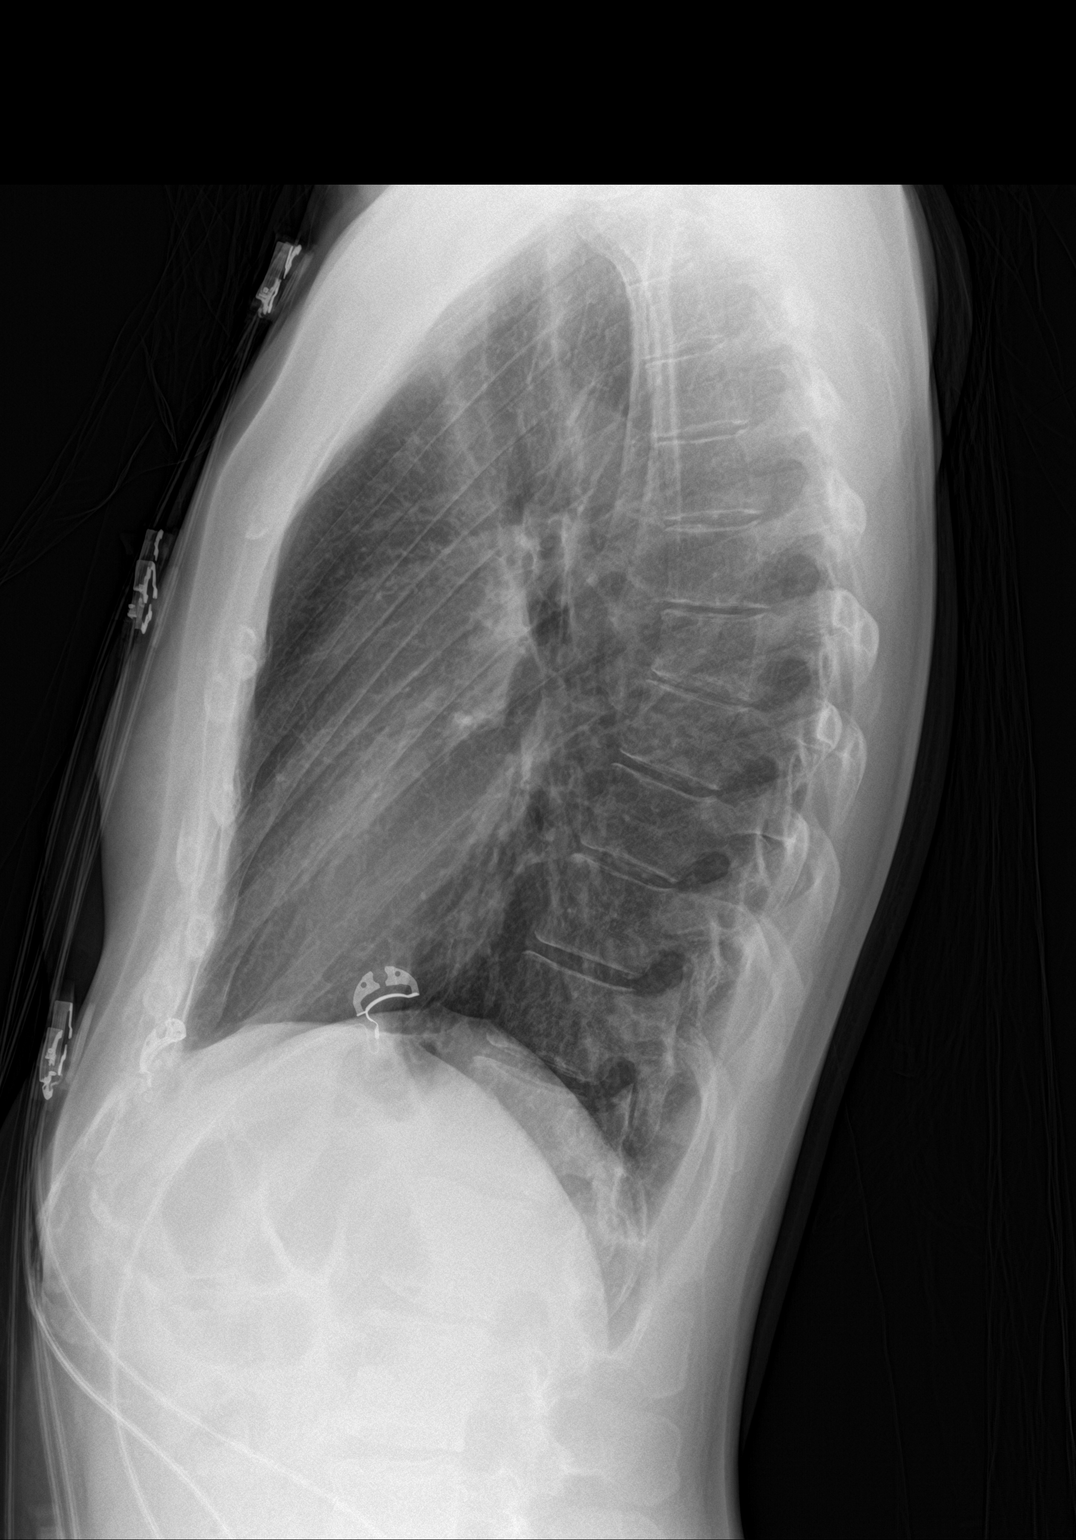

[2 of 2 positions shown; findings below may reference images not displayed]

FINDINGS: Cardiac silhouette is normal in size and configuration. No
mediastinal or hilar masses. No evidence of adenopathy.

Lungs are clear.

No pleural effusion or pneumothorax.

Skeletal structures are unremarkable.
IMPRESSION: No active cardiopulmonary disease.

## 2018-08-28 DIAGNOSIS — Z13820 Encounter for screening for osteoporosis: Secondary | ICD-10-CM | POA: Insufficient documentation

## 2018-08-28 DIAGNOSIS — R768 Other specified abnormal immunological findings in serum: Secondary | ICD-10-CM | POA: Insufficient documentation

## 2018-08-28 DIAGNOSIS — M2550 Pain in unspecified joint: Secondary | ICD-10-CM | POA: Insufficient documentation

## 2018-11-07 ENCOUNTER — Other Ambulatory Visit: Payer: Self-pay | Admitting: Gastroenterology

## 2018-11-07 DIAGNOSIS — K219 Gastro-esophageal reflux disease without esophagitis: Secondary | ICD-10-CM

## 2018-11-11 DIAGNOSIS — R768 Other specified abnormal immunological findings in serum: Secondary | ICD-10-CM | POA: Insufficient documentation

## 2019-02-16 ENCOUNTER — Telehealth: Payer: Self-pay | Admitting: Gastroenterology

## 2019-02-16 ENCOUNTER — Ambulatory Visit
Admission: EM | Admit: 2019-02-16 | Discharge: 2019-02-16 | Disposition: A | Discharge status: Home / Self Care | Payer: Medicaid Other | Attending: Family Medicine | Admitting: Family Medicine

## 2019-02-16 DIAGNOSIS — K228 Other specified diseases of esophagus: Secondary | ICD-10-CM

## 2019-02-16 DIAGNOSIS — R12 Heartburn: Secondary | ICD-10-CM

## 2019-02-16 DIAGNOSIS — K219 Gastro-esophageal reflux disease without esophagitis: Secondary | ICD-10-CM | POA: Diagnosis not present

## 2019-02-16 DIAGNOSIS — K2289 Other specified disease of esophagus: Secondary | ICD-10-CM

## 2019-02-16 MED ORDER — OMEPRAZOLE 40 MG PO CPDR: 40.0000 mg | DELAYED_RELEASE_CAPSULE | Freq: Two times a day (BID) | ORAL | 0 refills | Status: DC

## 2019-02-16 NOTE — Telephone Encounter (Signed)
Called by page operator due to severe heart burn. Got worse in last 2-3 days, unable to sleep, taking protonix 40mg  BIDand sucralfate. Some difficulty swallowing, unable to drink liquids either due to severe heart burn. Already had 2 egds. Will try prilosec 40mg  BID, prescription sent to pharmacy  Arlyss Repress, MD [address]  [address]  [place], [place] [address]  Main: [phone]  Fax: [phone] Pager: [phone]

## 2019-02-16 NOTE — ED Provider Notes (Signed)
[facility]    CSN: [id number] Arrival date & time: 02/16/19  1116     History   Chief Complaint Chief Complaint  Patient presents with  . GI Problem    HPI Vicki Dominguez is a 33 y.o. female.   33 yo female with a h/o GERD, IBS, palpitations and anxiety presents with a c/o worsening acid reflux and esophageal pain over the past 2-3 days. States symptoms are worse at night with the reflux, acidity taste in mouth and pain. Has been taking protonix twice daily and took one dose of carafate yesterday. Has seen GI in the past and had endoscopies. Denies fevers, chills, shortness of breath, chest pressure/pain.   GI Problem     Past Medical History:  Diagnosis Date  . Anxiety   . Arthritis    hands, legs  . Colitis   . Depression   . Dysrhythmia    PCP wants her to have wear a heart monitor  . Family history of adverse reaction to anesthesia    Mother difficult to awaken after Succinycholine, son had PONV  . GERD (gastroesophageal reflux disease)   . Migraine    Rare.  Tension HA 3x/wk  . Motion sickness   . Ovarian cyst   . Scoliosis   . TMJ arthritis   . TMJ syndrome     Patient Active Problem List   Diagnosis Date Noted  . Diarrhea   . Rash 02/11/2018  . Chest wall pain 12/03/2017  . Opioid use disorder, severe, dependence (HCC) 01/24/2017  . Tobacco use disorder 01/24/2017  . Cannabis use disorder, moderate, dependence (HCC) 01/24/2017  . Severe recurrent major depression without psychotic features (HCC) 01/23/2017  . GERD (gastroesophageal reflux disease) 02/28/2016  . Depression 05/20/2012    Past Surgical History:  Procedure Laterality Date  . COLONOSCOPY WITH PROPOFOL N/A 07/03/2016   Procedure: COLONOSCOPY WITH PROPOFOL;  Surgeon: Midge Minium, MD;  Location: [facility];  Service: Endoscopy;  Laterality: N/A;  . COLONOSCOPY WITH PROPOFOL N/A 04/04/2018   Procedure: COLONOSCOPY WITH biopsies;  Surgeon: Midge Minium, MD;  Location:  [facility];  Service: Endoscopy;  Laterality: N/A;  . ESOPHAGOGASTRODUODENOSCOPY N/A 03/06/2016   Procedure: ESOPHAGOGASTRODUODENOSCOPY (EGD);  Surgeon: Scot Jun, MD;  Location: [facility];  Service: Endoscopy;  Laterality: N/A;  . ESOPHAGOGASTRODUODENOSCOPY (EGD) WITH PROPOFOL N/A 04/04/2018   Procedure: ESOPHAGOGASTRODUODENOSCOPY (EGD) WITH biopsies;  Surgeon: Midge Minium, MD;  Location: [facility];  Service: Endoscopy;  Laterality: N/A;  . LAPAROSCOPIC OVARIAN CYSTECTOMY Left 03/07/2016   Procedure: LAPAROSCOPIC OVARIAN CYSTECTOMY;  Surgeon: Conard Novak, MD;  Location: [facility] ORS;  Service: Gynecology;  Laterality: Left;  . OVARIAN CYST REMOVAL    . TEMPOROMANDIBULAR JOINT SURGERY      OB History   No obstetric history on file.      Home Medications    Prior to Admission medications   Medication Sig Start Date End Date Taking? Authorizing Provider  alprazolam Prudy Feeler) 2 MG tablet Take 2 mg by mouth 2 (two) times daily.    Yes [provider]  celecoxib (CELEBREX) 100 MG capsule TAKE 1 CAPSULE (100 MG TOTAL) BY MOUTH TWO (2) TIMES A DAY. 02/11/18  Yes [provider]  Multiple Vitamin (MULTIVITAMIN) tablet Take 1 tablet by mouth daily.    Yes [provider]  pantoprazole (PROTONIX) 40 MG tablet TAKE 1 TABLET BY MOUTH TWICE A DAY 11/12/18  Yes Midge Minium, MD  polyethylene glycol (GOLYTELY) 236 g  solution Drink one 8 oz glass every 20 mins until the entire container is finished. 03/11/18  Yes Midge MiniumWohl, Darren, MD  promethazine (PHENERGAN) 25 MG tablet Take 25 mg by mouth every 6 (six) hours as needed for nausea or vomiting.   Yes [provider]  sucralfate (CARAFATE) 1 GM/10ML suspension Take 10 mLs (1 g total) by mouth 4 (four) times daily as needed. 03/13/18  Yes Midge MiniumWohl, Darren, MD  VYVANSE 60 MG capsule Take 60 mg by mouth daily.  07/23/17  Yes [provider]  buprenorphine (SUBUTEX) 8 MG SUBL SL tablet Place under  the tongue. 02/04/16   [provider]  chlorhexidine (PERIDEX) 0.12 % solution SWISH AND SPIT 10 MLS TWICE A DAY 10/17/17   [provider]  clobetasol ointment (TEMOVATE) 0.05 % APPLY TO AFFECTED AREA TWICE A DAY 02/11/18   [provider]  DEXILANT 60 MG capsule TAKE 1 CAPSULE BY MOUTH EVERY DAY Patient not taking: Reported on 03/11/2018 02/21/18   Midge MiniumWohl, Darren, MD  dicyclomine (BENTYL) 10 MG capsule Take 2 capsules (20 mg total) by mouth every 6 (six) hours Patient taking differently: 10 mg. Take 2 capsules (20 mg total) by mouth every 6 (six) hours 08/31/17 11/03/17  Midge MiniumWohl, Darren, MD  dicyclomine (BENTYL) 20 MG tablet TAKE 1 TABLET (20 MG TOTAL) BY MOUTH 3 (THREE) TIMES DAILY BEFORE MEALS. 02/05/18   Midge MiniumWohl, Darren, MD  diphenoxylate-atropine (LOMOTIL) 2.5-0.025 MG tablet Take 1 tablet by mouth 4 (four) times daily as needed for diarrhea or loose stools. 04/05/18   Midge MiniumWohl, Darren, MD  ipratropium (ATROVENT) 0.06 % nasal spray PLACE 2 SPRAYS INTO BOTH NOSTRILS 3 (THREE) TIMES DAILY 01/05/18   [provider]  lamoTRIgine (LAMICTAL) 100 MG tablet  03/01/18   [provider]  naproxen (NAPROSYN) 500 MG tablet Take 1 tablet (500 mg total) by mouth 2 (two) times daily. Patient not taking: Reported on 03/11/2018 12/03/17   Candis SchatzHarris, Michael D, PA-C    Family History Family History  Problem Relation Age of Onset  . Heart attack Mother   . Hypertension Father     Social History Social History   Tobacco Use  . Smoking status: Current Every Day Smoker    Packs/day: 0.75    Years: 10.00    Pack years: 7.50    Types: Cigarettes  . Smokeless tobacco: Never Used  Substance Use Topics  . Alcohol use: Yes    Alcohol/week: 0.0 standard drinks    Comment: occasionally  . Drug use: No     Allergies   Succinylcholine chloride and Tape   Review of Systems Review of Systems   Physical Exam Triage Vital Signs ED Triage Vitals  Enc Vitals Group     BP 02/16/19  1123 (!) 129/106     Pulse Rate 02/16/19 1123 (!) 124     Resp 02/16/19 1123 18     Temp 02/16/19 1123 98 F (36.7 C)     Temp Source 02/16/19 1123 Oral     SpO2 02/16/19 1123 100 %     Weight 02/16/19 1125 120 lb (54.4 kg)     Height 02/16/19 1125 5\' 2"  (1.575 m)     Head Circumference --      Peak Flow --      Pain Score 02/16/19 1125 4     Pain Loc --      Pain Edu? --      Excl. in GC? --    No data found.  Updated Vital Signs BP (!) 129/106 (BP Location: Right Arm)   Pulse (!) 124   Temp 98 F (36.7 C) (Oral)   Resp 18   Ht  (1.575 m)   Wt 54.4 kg   LMP 02/09/2019 (Exact Date)   SpO2 100%   BMI 21.95 kg/m   Visual Acuity Right Eye Distance:   Left Eye Distance:   Bilateral Distance:    Right Eye Near:   Left Eye Near:    Bilateral Near:     Physical Exam Vitals signs and nursing note reviewed.  Constitutional:      General: She is not in acute distress.    Appearance: She is not toxic-appearing.  Abdominal:     General: Bowel sounds are normal. There is no distension.     Palpations: Abdomen is soft. There is no mass.     Tenderness: There is no abdominal tenderness. There is no right CVA tenderness, left CVA tenderness, guarding or rebound.     Hernia: No hernia is present.  Neurological:     Mental Status: She is alert.      UC Treatments / Results  Labs (all labs ordered are listed, but only abnormal results are displayed) Labs Reviewed - No data to display  EKG None  Radiology No results found.  Procedures Procedures (including critical care time)  Medications Ordered in UC Medications - No data to display  Initial Impression / Assessment and Plan / UC Course  I have reviewed the triage vital signs and the nursing notes.  Pertinent labs & imaging results that were available during my care of the patient were reviewed by me and considered in my medical decision making (see chart for details).      Final Clinical  Impressions(s) / UC Diagnoses   Final diagnoses:  Gastroesophageal reflux disease, esophagitis presence not specified  Esophageal pain     Discharge Instructions     Recommend taking carafate three times daily consistently until follow up with Gastroenterologist; continue other current medications Follow up with Gastroenterologist    ED Prescriptions    None     1. diagnosis reviewed with patient 2. Recommend supportive treatment as above (patient states she  has enough medication) 3. Follow-up prn if symptoms worsen or don't improve   Controlled Substance Prescriptions [place] Controlled Substance Registry consulted? Not Applicable   Payton Mccallum, MD 02/16/19 1255

## 2019-02-16 NOTE — Discharge Instructions (Signed)
Recommend taking carafate three times daily consistently until follow up with Gastroenterologist; continue other current medications Follow up with Gastroenterologist

## 2019-02-16 NOTE — ED Triage Notes (Signed)
Has a hx of IBS. But for the past 2 days has been having "tightening" in her esophageus and pain radiating to her back especially when she is laying flat. Is taking Protonix twice a day. Has been keeping her up at night.

## 2019-02-18 ENCOUNTER — Telehealth: Payer: Self-pay | Admitting: Gastroenterology

## 2019-02-18 NOTE — Telephone Encounter (Signed)
Pt is calling to schedule apt with Dr. Servando Snare pt was informed Template has not been opened to schedule apt at this time ,she would like a call from [facility] she has called the Doctor on call for Acid reflux who prescripted her Omeprazole she states it hs helped some but she is also taking rx Protonix and Carafate and Tumbs she would like a call due to her reflux being so bad

## 2019-02-19 NOTE — Telephone Encounter (Signed)
Left vm for pt to return my call. Advised her on the voicemail, she will need an office appt or virtual appt with Dr. Servando Snare to discuss increase in reflux symptoms.

## 2019-02-20 NOTE — Telephone Encounter (Signed)
Please schedule pt an office/virtual appt with Dr. Servando Snare. Dx: severe reflux. Thank you!! I left pt a voicemail yesterday informing her she would need an appt due to the severity of her symptoms.

## 2019-02-27 ENCOUNTER — Encounter: Payer: Self-pay | Admitting: Gastroenterology

## 2019-02-27 ENCOUNTER — Other Ambulatory Visit: Payer: Self-pay

## 2019-02-27 ENCOUNTER — Ambulatory Visit (INDEPENDENT_AMBULATORY_CARE_PROVIDER_SITE_OTHER): Payer: Medicaid Other | Admitting: Gastroenterology

## 2019-02-27 VITALS — BP 142/96 | HR 90 | Temp 98.4°F | Ht 62.0 in | Wt 118.2 lb

## 2019-02-27 DIAGNOSIS — K581 Irritable bowel syndrome with constipation: Secondary | ICD-10-CM

## 2019-02-27 DIAGNOSIS — K219 Gastro-esophageal reflux disease without esophagitis: Secondary | ICD-10-CM | POA: Diagnosis not present

## 2019-02-27 DIAGNOSIS — R1013 Epigastric pain: Secondary | ICD-10-CM | POA: Diagnosis not present

## 2019-02-27 NOTE — Progress Notes (Signed)
Primary Care Physician: [facility]  Primary Gastroenterologist:  Dr. Midge Miniumarren Brinden Kincheloe  No chief complaint on file.   HPI: Vicki Dominguez is a 33 y.o. female here being seen in [place] urgent [facility] for GERD and a report of esophageal spasms. female here being seen in [place] urgent care for GERD and a report of esophageal spasms.  The patient was being treated with Protonix twice a day and was taking Carafate at night.  Patient had an upper endoscopy in 2019 by May with mild gastritis but no abnormalities seen in the esophagus.  The patient had a colonoscopy back then for diarrhea with random biopsies taken that did not show any cause for her symptoms.  The patient reports that she has burning in her chest and chest pressure in the middle the night.  It is not usually associate with eating or drinking.  She thinks it is caused by reflux despite taking her Protonix twice a day and supplementing it with Carafate and H2 blockers in addition to Tums.  Patient also reports that she has been losing weight but very slowly.  She states that she is doing this without trying.  Current Outpatient Medications  Medication Sig Dispense Refill  . alprazolam (XANAX) 2 MG tablet Take 2 mg by mouth 2 (two) times daily.     . buprenorphine (SUBUTEX) 8 MG SUBL SL tablet Place under the tongue.    . celecoxib (CELEBREX) 100 MG capsule TAKE 1 CAPSULE (100 MG TOTAL) BY MOUTH TWO (2) TIMES A DAY.  1  . chlorhexidine (PERIDEX) 0.12 % solution SWISH AND SPIT 10 MLS TWICE A DAY  0  . clobetasol ointment (TEMOVATE) 0.05 % APPLY TO AFFECTED AREA TWICE A DAY  0  . DEXILANT 60 MG capsule TAKE 1 CAPSULE BY MOUTH EVERY DAY (Patient not taking: Reported on 03/11/2018) 30 capsule 6  . dicyclomine (BENTYL) 10 MG capsule Take 2 capsules (20 mg total) by mouth every 6 (six) hours (Patient taking differently: 10 mg. Take 2 capsules (20 mg total) by mouth every 6 (six) hours) 30 capsule 0  . dicyclomine (BENTYL) 20 MG tablet TAKE 1 TABLET (20 MG TOTAL) BY MOUTH 3 (THREE) TIMES DAILY BEFORE MEALS. 90 tablet 6  .  diphenoxylate-atropine (LOMOTIL) 2.5-0.025 MG tablet Take 1 tablet by mouth 4 (four) times daily as needed for diarrhea or loose stools. 60 tablet 5  . FETZIMA 20 MG CP24     . fluticasone (FLONASE) 50 MCG/ACT nasal spray SPRAY 1 SPRAY INTO EACH NOSTRIL EVERY DAY    . glycopyrrolate (ROBINUL) 1 MG tablet Take 2 mg by mouth 2 (two) times daily.    Marland Kitchen. ipratropium (ATROVENT) 0.06 % nasal spray PLACE 2 SPRAYS INTO BOTH NOSTRILS 3 (THREE) TIMES DAILY  1  . lamoTRIgine (LAMICTAL) 100 MG tablet   3  . meloxicam (MOBIC) 15 MG tablet Take 15 mg by mouth daily.    . metoprolol succinate (TOPROL-XL) 25 MG 24 hr tablet Take 25 mg by mouth daily.    . Multiple Vitamin (MULTIVITAMIN) tablet Take 1 tablet by mouth daily.     Marland Kitchen. MYDAYIS 50 MG CP24     . naproxen (NAPROSYN) 500 MG tablet Take 1 tablet (500 mg total) by mouth 2 (two) times daily. (Patient not taking: Reported on 03/11/2018) 30 tablet 0  . omeprazole (PRILOSEC) 40 MG capsule Take 1 capsule (40 mg total) by mouth 2 (two) times daily before a meal for 14 days. 28 capsule 0  . pantoprazole (PROTONIX) 40 MG tablet TAKE 1 TABLET BY MOUTH TWICE A DAY 60  tablet 6  . polyethylene glycol (GOLYTELY) 236 g solution Drink one 8 oz glass every 20 mins until the entire container is finished. 4000 mL 0  . promethazine (PHENERGAN) 25 MG tablet Take 25 mg by mouth every 6 (six) hours as needed for nausea or vomiting.    . sucralfate (CARAFATE) 1 GM/10ML suspension Take 10 mLs (1 g total) by mouth 4 (four) times daily as needed. 420 mL 3  . traMADol (ULTRAM) 50 MG tablet Take 50 mg by mouth every 6 (six) hours.    Marland Kitchen VYVANSE 60 MG capsule Take 60 mg by mouth daily.   0   No current facility-administered medications for this visit.     Allergies as of 02/27/2019 - Review Complete 02/16/2019  Allergen Reaction Noted  . Succinylcholine chloride Other (See Comments) 03/06/2016  . Tape Rash 06/26/2016    ROS:  General: Negative for anorexia, weight loss, fever,  chills, fatigue, weakness. ENT: Negative for hoarseness, difficulty swallowing , nasal congestion. CV: Negative for chest pain, angina, palpitations, dyspnea on exertion, peripheral edema.  Respiratory: Negative for dyspnea at rest, dyspnea on exertion, cough, sputum, wheezing.  GI: See history of present illness. GU:  Negative for dysuria, hematuria, urinary incontinence, urinary frequency, nocturnal urination.  Endo: Negative for unusual weight change.    Physical Examination:   LMP 02/09/2019 (Exact Date)   General: Well-nourished, well-developed in no acute distress.  Eyes: No icterus. Conjunctivae pink. Mouth: Oropharyngeal mucosa moist and pink , no lesions erythema or exudate. Lungs: Clear to auscultation bilaterally. Non-labored. Heart: Regular rate and rhythm, no murmurs rubs or gallops.  Abdomen: Bowel sounds are normal, nontender, nondistended, no hepatosplenomegaly or masses, no abdominal bruits or hernia , no rebound or guarding.   Extremities: No lower extremity edema. No clubbing or deformities. Neuro: Alert and oriented x 3.  Grossly intact. Skin: Warm and dry, no jaundice.   Psych: Alert and cooperative, normal mood and affect.  Labs:    Imaging Studies: No results found.  Assessment and Plan:   Vicki Dominguez is a 33 y.o. y/o female the report of what feels to her to be esophageal spasms with heartburn despite being on a PPI twice a day and taking Carafate before she goes to sleep with the addition of antacids such as Tums at times. the report of what feels to her to be esophageal spasms with heartburn despite being on a PPI twice a day and taking Carafate before she goes to sleep with the addition of antacids such as Tums at times.  The patient continues to have symptoms and will be set up for a esophageal pH study with impedance and manometry.  The patient has had 2 EGDs in the past without any sign of strictures or narrowings.  The patient reports that her mother has had an esophageal stricture and so has her father.  The patient has been reassured that none of her symptoms sound like an obstructive process caused by a stricture.  The patient also reports that she  has been more constipated and her diarrhea has gone away.  She has been told to start MiraLAX daily to help with her constipation and bloating.  The patient has been explained the plan and agrees with it.    Midge Minium, MD. Clementeen Graham   Note: This dictation was prepared with Dragon dictation along with smaller phrase technology. Any transcriptional errors that result from this process are unintentional.

## 2019-03-20 ENCOUNTER — Telehealth: Payer: Self-pay | Admitting: Gastroenterology

## 2019-03-20 NOTE — Telephone Encounter (Signed)
LVM asking pt to return call and if unable to reach [place] please go to ER

## 2019-03-20 NOTE — Telephone Encounter (Signed)
PATIENT CALLED & L/M STATING SHE HAS A HEMORRHOID UNLIKE ANY BEFORE & WOULD LIKE TO KNOW WHAT TO DO.

## 2019-03-26 NOTE — Telephone Encounter (Signed)
Spoke with pt regarding her hemorrhoid. Pt advised per Dr. Allen Norris, most likely since the hemorrhoid has gotten smaller and is now bleeding, it most likely ruptured. At this point, no treatment is necessary. Advised pt to try and keep the area clean and dry. Take warm baths and soak the area. Will contact the pt in a few days to check status.

## 2019-03-26 NOTE — Telephone Encounter (Signed)
Pt is calling she was told by a nurse she would get rx for a compound cream she states the Pharmacy does not have it please call that in to [facility] in [place]. She also thinks her Hemmroid has ruptured because  She is experiencing more bleeding since yesterday

## 2019-03-26 NOTE — Telephone Encounter (Signed)
LVM for pt to return my call.

## 2019-04-02 ENCOUNTER — Other Ambulatory Visit: Payer: Self-pay

## 2019-04-02 ENCOUNTER — Ambulatory Visit (INDEPENDENT_AMBULATORY_CARE_PROVIDER_SITE_OTHER): Payer: Medicaid Other | Admitting: Gastroenterology

## 2019-04-02 ENCOUNTER — Encounter: Payer: Self-pay | Admitting: Gastroenterology

## 2019-04-02 VITALS — BP 126/87 | HR 116 | Temp 97.9°F | Resp 18 | Ht 62.0 in | Wt 121.8 lb

## 2019-04-02 DIAGNOSIS — K640 First degree hemorrhoids: Secondary | ICD-10-CM

## 2019-04-02 DIAGNOSIS — K5909 Other constipation: Secondary | ICD-10-CM

## 2019-04-02 DIAGNOSIS — K644 Residual hemorrhoidal skin tags: Secondary | ICD-10-CM | POA: Diagnosis not present

## 2019-04-02 MED ORDER — HYDROCORTISONE (PERIANAL) 2.5 % EX CREA: TOPICAL_CREAM | Freq: Two times a day (BID) | CUTANEOUS | 0 refills | Status: AC

## 2019-04-02 NOTE — Patient Instructions (Signed)

## 2019-04-02 NOTE — Progress Notes (Signed)
Vicki Darby, MD [address]  [place]  [place], [place] [address]  Main: [phone]  Fax: [phone] Pager: [phone]   Primary Care Physician: [facility]  Primary Gastroenterologist:  Dr. Cephas Dominguez  Chief Complaint  Patient presents with  . Follow-up    Possible Banding     HPI: Vicki Dominguez is a 33 y.o. female with rectal bleeding that occurred 2 weks ago associated with severe pain.  A week ago, she felt that her hemorrhoid ruptured that resulted in improvement in pain. She still has discomfort and swelling in her rectum but much improved compared to 2 weeks ago.  She has been applying Preparation H as needed which provides temporary relief only.  She reports having irregular bowel habits associated with straining.  She reports having mild rectal discomfort intermittently for long time.  This is the worst pain she ever had.  Patient was evaluated by Dr. Allen Norris with colonoscopy for chronic diarrhea which was unremarkable.  Lately, she has been suffering from constipation.  She does acknowledge drinking Coke 2 to 3 cans a day, chews gum twice a day, consumes red meat weekly. Patient denies perianal itching, irritation, burning, prolapse  Current Outpatient Medications  Medication Sig Dispense Refill  . alprazolam (XANAX) 2 MG tablet Take 2 mg by mouth 2 (two) times daily.     . celecoxib (CELEBREX) 100 MG capsule TAKE 1 CAPSULE (100 MG TOTAL) BY MOUTH TWO (2) TIMES A DAY.  1  . clobetasol ointment (TEMOVATE) 0.05 % APPLY TO AFFECTED AREA TWICE A DAY  0  . diphenoxylate-atropine (LOMOTIL) 2.5-0.025 MG tablet Take 1 tablet by mouth 4 (four) times daily as needed for diarrhea or loose stools. 60 tablet 5  . FETZIMA 20 MG CP24     . fluticasone (FLONASE) 50 MCG/ACT nasal spray SPRAY 1 SPRAY INTO EACH NOSTRIL EVERY DAY    . glycopyrrolate (ROBINUL) 1 MG tablet Take 2 mg by mouth 2 (two) times daily.    . Multiple Vitamin (MULTIVITAMIN) tablet Take 1  tablet by mouth daily.     . pantoprazole (PROTONIX) 40 MG tablet TAKE 1 TABLET BY MOUTH TWICE A DAY 60 tablet 6  . promethazine (PHENERGAN) 25 MG tablet Take 25 mg by mouth every 6 (six) hours as needed for nausea or vomiting.    . sucralfate (CARAFATE) 1 GM/10ML suspension Take 10 mLs (1 g total) by mouth 4 (four) times daily as needed. 420 mL 3  . VYVANSE 60 MG capsule Take 60 mg by mouth daily.   0  . hydrocortisone (ANUSOL-HC) 2.5 % rectal cream Place rectally 2 (two) times daily for 14 days. 28 g 0   No current facility-administered medications for this visit.     Allergies as of 04/02/2019 - Review Complete 04/02/2019  Allergen Reaction Noted  . Succinylcholine chloride Other (See Comments) 03/06/2016  . Tape Rash 06/26/2016    NSAIDs: None  Antiplts/Anticoagulants/Anti thrombotics: None  GI procedures: EGD and colonoscopy in 2017 in 2019 which were unremarkable  ROS:  General: Negative for anorexia, weight loss, fever, chills, fatigue, weakness. ENT: Negative for hoarseness, difficulty swallowing , nasal congestion. CV: Negative for chest pain, angina, palpitations, dyspnea on exertion, peripheral edema.  Respiratory: Negative for dyspnea at rest, dyspnea on exertion, cough, sputum, wheezing.  GI: See history of present illness. GU:  Negative for dysuria, hematuria, urinary incontinence, urinary frequency, nocturnal urination.  Endo: Negative for unusual weight change.    Physical Examination:  BP 126/87 (BP Location: Left Arm, Patient Position: Sitting, Cuff Size: Normal)   Pulse (!) 116   Temp 97.9 F (36.6 C)   Resp 18   Ht 5\' 2"  (1.575 m)   Wt 121 lb 12.8 oz (55.2 kg)   BMI 22.28 kg/m   General: Well-nourished, well-developed in no acute distress.  Eyes: No icterus. Conjunctivae pink. Mouth: Oropharyngeal mucosa moist and pink , no lesions erythema or exudate. Lungs: Clear to auscultation bilaterally. Non-labored. Heart: Regular rate and rhythm, no murmurs  rubs or gallops.  Abdomen: Bowel sounds are normal, nontender, nondistended, no hepatosplenomegaly or masses, no hernia , no rebound or guarding.   Rectum: Spontaneously ruptured external hemorrhoid, healing well, distal rectal exam revealed moderate tenderness in the posterior wall of the anal canal, inflamed external hemorrhoids Extremities: No lower extremity edema. No clubbing or deformities. Neuro: Alert and oriented x 3.  Grossly intact. Skin: Warm and dry, no jaundice, tattoos all over her body.   Psych: Alert and cooperative, normal mood and affect.   Imaging Studies: No results found.  Assessment and Plan:   Vicki Dominguez is a 33 y.o. female chronic constipation, thrombosed external hemorrhoid that spontaneously ruptured.  I discussed with her about hemorrhoid ligation in future if her symptoms recur.  Patient would like to try anti-inflammatory agents first before proceeding with hemorrhoid ligation.  Recommend to try Anusol cream per rectum twice daily for 2 weeks.  Recommend sitz baths, witch hazel or Tucks pads.  Recommended her about high-fiber diet, information provided, trial of MiraLAX to manage constipation  Follow up in 4 weeks   Dr Lannette Donathohini , MD

## 2019-05-07 ENCOUNTER — Encounter: Payer: Self-pay | Admitting: *Deleted

## 2019-05-07 ENCOUNTER — Ambulatory Visit: Payer: Medicaid Other | Admitting: Gastroenterology

## 2019-05-28 ENCOUNTER — Other Ambulatory Visit: Payer: Self-pay | Admitting: Gastroenterology

## 2019-05-28 DIAGNOSIS — K219 Gastro-esophageal reflux disease without esophagitis: Secondary | ICD-10-CM

## 2019-05-30 NOTE — Telephone Encounter (Signed)
Pt needs refill on rx Protonic 40 mg 2 times a day to [facility] [place]  Please p[t last apt with Dr. Allen Norris was June 2020

## 2019-05-31 ENCOUNTER — Encounter: Payer: Self-pay | Admitting: Gastroenterology

## 2019-05-31 ENCOUNTER — Other Ambulatory Visit: Payer: Self-pay | Admitting: Gastroenterology

## 2019-06-04 NOTE — Telephone Encounter (Signed)
Rx for Pantoprazole has been sent to pt.

## 2019-06-10 ENCOUNTER — Other Ambulatory Visit: Payer: Self-pay

## 2019-06-11 ENCOUNTER — Other Ambulatory Visit: Payer: Self-pay

## 2019-06-11 DIAGNOSIS — K219 Gastro-esophageal reflux disease without esophagitis: Secondary | ICD-10-CM

## 2019-06-11 MED ORDER — PANTOPRAZOLE SODIUM 40 MG PO TBEC: 40.0000 mg | DELAYED_RELEASE_TABLET | Freq: Two times a day (BID) | ORAL | 6 refills | Status: DC

## 2019-06-30 ENCOUNTER — Encounter: Payer: Self-pay | Admitting: Emergency Medicine

## 2019-06-30 ENCOUNTER — Ambulatory Visit
Admission: EM | Admit: 2019-06-30 | Discharge: 2019-06-30 | Disposition: A | Discharge status: Home / Self Care | Payer: Medicaid Other | Attending: Family Medicine | Admitting: Family Medicine

## 2019-06-30 ENCOUNTER — Other Ambulatory Visit: Payer: Self-pay

## 2019-06-30 DIAGNOSIS — L299 Pruritus, unspecified: Secondary | ICD-10-CM

## 2019-06-30 MED ORDER — HYDROXYZINE HCL 25 MG PO TABS: 25.0000 mg | ORAL_TABLET | Freq: Three times a day (TID) | ORAL | 0 refills | Status: DC | PRN

## 2019-06-30 MED ORDER — PREDNISONE 10 MG PO TABS: ORAL_TABLET | ORAL | 0 refills | Status: DC

## 2019-06-30 NOTE — ED Provider Notes (Signed)
[facility] ____________________________________________  Time seen: Approximately 4:52 PM  I have reviewed the triage vital signs and the nursing notes.   HISTORY  Chief Complaint Pruritis   HPI Vicki Dominguez is a 33 y.o. female presenting for evaluation of itching for the last 2 weeks.  Patient states sometimes her skin is somewhat red and blotchy but no known distinct rash.  States she did recently swim in her friend's pool 2 weeks ago prior to this but states this was something she does fairly regularly without change.  Denies any changes in foods, medicines, lotions, detergents or other contacts.  States skin is very itchy.  Denies any pain.  No insect bites.  Denies others in household with similar.  States over-the-counter Claritin Zyrtec and Benadryl does help some.  Does report she has been under a lot of stress and some anxiety.  Denies recent fevers, cough, chest pain, shortness of breath or other complaints.  Reports otherwise doing well.  Patient's last menstrual period was 06/15/2019.  Denies pregnancy   Past Medical History:  Diagnosis Date  . Anxiety   . Arthritis    hands, legs  . Colitis   . Depression   . Dysrhythmia    PCP wants her to have wear a heart monitor  . Family history of adverse reaction to anesthesia    Mother difficult to awaken after Succinycholine, son had PONV  . GERD (gastroesophageal reflux disease)   . Migraine    Rare.  Tension HA 3x/wk  . Motion sickness   . Ovarian cyst   . Scoliosis   . TMJ arthritis   . TMJ syndrome     Patient Active Problem List   Diagnosis Date Noted  . False positive ana 11/11/2018  . Polyarthralgia 08/28/2018  . Positive ANA (antinuclear antibody) 08/28/2018  . Screening for osteoporosis 08/28/2018  . Back pain 06/24/2018  . Diarrhea   . Scoliosis 03/22/2018  . Tachycardia 03/12/2018  . Rash 02/11/2018  . Chest wall pain 12/03/2017  . Opioid use disorder, severe, dependence (HCC)  01/24/2017  . Tobacco use disorder 01/24/2017  . Cannabis use disorder, moderate, dependence (HCC) 01/24/2017  . Severe recurrent major depression without psychotic features (HCC) 01/23/2017  . GERD (gastroesophageal reflux disease) 02/28/2016  . Depression 05/20/2012  . TMJ (temporomandibular joint syndrome) 11/23/2009  . History of cardiac arrhythmia 11/24/2003    Past Surgical History:  Procedure Laterality Date  . COLONOSCOPY WITH PROPOFOL N/A 07/03/2016   Procedure: COLONOSCOPY WITH PROPOFOL;  Surgeon: Midge Miniumarren Wohl, MD;  [facility];  Service: Endoscopy;  Laterality: N/A;  . COLONOSCOPY WITH PROPOFOL N/A 04/04/2018   Procedure: COLONOSCOPY WITH biopsies;  Surgeon: Midge MiniumWohl, Darren, MD;  [facility];  Service: Endoscopy;  Laterality: N/A;  . ESOPHAGOGASTRODUODENOSCOPY N/A 03/06/2016   Procedure: ESOPHAGOGASTRODUODENOSCOPY (EGD);  Surgeon: Scot Junobert T Elliott, MD;  [facility];  Service: Endoscopy;  Laterality: N/A;  . ESOPHAGOGASTRODUODENOSCOPY (EGD) WITH PROPOFOL N/A 04/04/2018   Procedure: ESOPHAGOGASTRODUODENOSCOPY (EGD) WITH biopsies;  Surgeon: Midge MiniumWohl, Darren, MD;  [facility];  Service: Endoscopy;  Laterality: N/A;  . LAPAROSCOPIC OVARIAN CYSTECTOMY Left 03/07/2016   Procedure: LAPAROSCOPIC OVARIAN CYSTECTOMY;  Surgeon: Conard NovakStephen D Jackson, MD;  Location: [facility];  Service: Gynecology;  Laterality: Left;  . OVARIAN CYST REMOVAL    . TEMPOROMANDIBULAR JOINT SURGERY       No current facility-administered medications for this encounter.   Current Outpatient Medications:  .  alprazolam (XANAX) 2 MG tablet, Take  2 mg by mouth 2 (two) times daily. , Disp: , Rfl:  .  celecoxib (CELEBREX) 100 MG capsule, TAKE 1 CAPSULE (100 MG TOTAL) BY MOUTH TWO (2) TIMES A DAY., Disp: , Rfl: 1 .  clobetasol ointment (TEMOVATE) 0.05 %, APPLY TO AFFECTED AREA TWICE A DAY, Disp: , Rfl: 0 .  FETZIMA 20 MG CP24, , Disp: , Rfl:  .  fluticasone  (FLONASE) 50 MCG/ACT nasal spray, SPRAY 1 SPRAY INTO EACH NOSTRIL EVERY DAY, Disp: , Rfl:  .  glycopyrrolate (ROBINUL) 1 MG tablet, Take 2 mg by mouth 2 (two) times daily., Disp: , Rfl:  .  Multiple Vitamin (MULTIVITAMIN) tablet, Take 1 tablet by mouth daily. , Disp: , Rfl:  .  pantoprazole (PROTONIX) 40 MG tablet, Take 1 tablet (40 mg total) by mouth 2 (two) times daily., Disp: 60 tablet, Rfl: 6 .  promethazine (PHENERGAN) 25 MG tablet, Take 25 mg by mouth every 6 (six) hours as needed for nausea or vomiting., Disp: , Rfl:  .  VYVANSE 60 MG capsule, Take 60 mg by mouth daily. , Disp: , Rfl: 0 .  diphenoxylate-atropine (LOMOTIL) 2.5-0.025 MG tablet, Take 1 tablet by mouth 4 (four) times daily as needed for diarrhea or loose stools., Disp: 60 tablet, Rfl: 5 .  gabapentin (NEURONTIN) 300 MG capsule, , Disp: , Rfl:  .  hydrOXYzine (ATARAX/VISTARIL) 25 MG tablet, Take 1 tablet (25 mg total) by mouth 3 (three) times daily as needed for itching., Disp: 20 tablet, Rfl: 0 .  predniSONE (DELTASONE) 10 MG tablet, Start 60 mg po day one, then 50 mg po day two, taper by 10 mg daily until complete., Disp: 21 tablet, Rfl: 0 .  sucralfate (CARAFATE) 1 GM/10ML suspension, Take 10 mLs (1 g total) by mouth 4 (four) times daily as needed., Disp: 420 mL, Rfl: 3  Allergies Succinylcholine chloride and Tape  Family History  Problem Relation Age of Onset  . Heart attack Mother   . Hypertension Father     Social History Social History   Tobacco Use  . Smoking status: Current Every Day Smoker    Packs/day: 0.75    Years: 10.00    Pack years: 7.50    Types: Cigarettes  . Smokeless tobacco: Never Used  Substance Use Topics  . Alcohol use: Yes    Alcohol/week: 0.0 standard drinks    Comment: occasionally  . Drug use: No    Review of Systems Constitutional: No fever ENT: No sore throat. Cardiovascular: Denies chest pain. Respiratory: Denies shortness of breath. Gastrointestinal: No abdominal pain.   Musculoskeletal: Negative for back pain. Skin: Positive itching.  ____________________________________________   PHYSICAL EXAM:  VITAL SIGNS: ED Triage Vitals  Enc Vitals Group     BP 06/30/19 1552 (!) 130/98     Pulse Rate 06/30/19 1552 98     Resp 06/30/19 1552 18     Temp 06/30/19 1552 98.3 F (36.8 C)     Temp src --      SpO2 06/30/19 1552 100 %     Weight 06/30/19 1546 120 lb (54.4 kg)     Height 06/30/19 1546 5\' 2"  (1.575 m)     Head Circumference --      Peak Flow --      Pain Score 06/30/19 1544 8     Pain Loc --      Pain Edu? --      Excl. in GC? --     Constitutional: Alert and oriented. Well  appearing and in no acute distress. Eyes: Conjunctivae are normal.  ENT      Head: Normocephalic and atraumatic. Cardiovascular: Normal rate, regular rhythm. Grossly normal heart sounds.  Good peripheral circulation. Respiratory: Normal respiratory effort without tachypnea nor retractions. Breath sounds are clear and equal bilaterally. No wheezes, rales, rhonchi. Musculoskeletal: Steady gait.  No extremity edema noted. Neurologic:  Normal speech and language.  Skin:  Skin is warm, dry.  Except: Minimal blotchy erythema to bilateral upper outer shoulders without clear distinct rash, pruritic, no other rash noted, skin intact. Psychiatric: Mood and affect are normal. Speech and behavior are normal. Patient exhibits appropriate insight and judgment   ___________________________________________   LABS (all labs ordered are listed, but only abnormal results are displayed)  Labs Reviewed - No data to display   PROCEDURES Procedures     INITIAL IMPRESSION / ASSESSMENT AND PLAN / ED COURSE  Pertinent labs & imaging results that were available during my care of the patient were reviewed by me and considered in my medical decision making (see chart for details).  Well-appearing patient.  No acute distress.  Pruritus for the last 2 weeks.  Denies other changes.   Discussed concern of contact exposure triggering this versus stress anxiety.  Will treat with prednisone and hydroxyzine.  Continue home Claritin or Zyrtec daily.  Monitor.  Possible allergy testing in future for recurrence.Discussed indication, risks and benefits of medications with patient.  Discussed follow up with Primary care physician this week. Discussed follow up and return parameters including no resolution or any worsening concerns. Patient verbalized understanding and agreed to plan.   ____________________________________________   FINAL CLINICAL IMPRESSION(S) / ED DIAGNOSES  Final diagnoses:  Pruritus     ED Discharge Orders         Ordered    hydrOXYzine (ATARAX/VISTARIL) 25 MG tablet  3 times daily PRN     06/30/19 1612    predniSONE (DELTASONE) 10 MG tablet     06/30/19 1612           Note: This dictation was prepared with Dragon dictation along with smaller phrase technology. Any transcriptional errors that result from this process are unintentional.         Marylene Land, NP 06/30/19 1754

## 2019-06-30 NOTE — Discharge Instructions (Addendum)
Take medication as prescribed.  Continue Claritin or Zyrtec daily for 2 weeks.  Monitor for triggers.  Avoid scratching.  Follow up with your primary care physician this week as needed. Return to Urgent care for new or worsening concerns.

## 2019-06-30 NOTE — ED Triage Notes (Signed)
Patient here today c/o itching all over body which started 1wk ago  Has taken benadryl, zyrtec,claritin which had no effect on itching with irritation Has been in a friends pool 2wks ago prior to this.  Denies: fever,swelling and eating any different 06/16/2019

## 2019-09-26 DIAGNOSIS — R87810 Cervical high risk human papillomavirus (HPV) DNA test positive: Secondary | ICD-10-CM

## 2019-09-26 HISTORY — DX: Cervical high risk human papillomavirus (HPV) DNA test positive: R87.810

## 2020-01-13 ENCOUNTER — Other Ambulatory Visit: Payer: Self-pay

## 2020-01-13 DIAGNOSIS — K219 Gastro-esophageal reflux disease without esophagitis: Secondary | ICD-10-CM

## 2020-01-13 MED ORDER — SUCRALFATE 1 GM/10ML PO SUSP: 1.0000 g | Freq: Four times a day (QID) | ORAL | 3 refills | Status: DC | PRN

## 2020-01-13 MED ORDER — PANTOPRAZOLE SODIUM 40 MG PO TBEC: 40.0000 mg | DELAYED_RELEASE_TABLET | Freq: Two times a day (BID) | ORAL | 6 refills | Status: DC

## 2020-03-30 ENCOUNTER — Inpatient Hospital Stay
Admission: RE | Admit: 2020-03-30 | Discharge: 2020-03-30 | Disposition: A | Discharge status: Home / Self Care | Payer: Medicaid Other | Source: Ambulatory Visit

## 2020-05-04 ENCOUNTER — Other Ambulatory Visit (HOSPITAL_COMMUNITY)
Admission: RE | Admit: 2020-05-04 | Discharge: 2020-05-04 | Disposition: A | Discharge status: Home / Self Care | Payer: Medicaid Other | Source: Ambulatory Visit | Attending: Obstetrics and Gynecology | Admitting: Obstetrics and Gynecology

## 2020-05-04 ENCOUNTER — Other Ambulatory Visit: Payer: Self-pay

## 2020-05-04 ENCOUNTER — Encounter: Payer: Self-pay | Admitting: Obstetrics and Gynecology

## 2020-05-04 ENCOUNTER — Ambulatory Visit (INDEPENDENT_AMBULATORY_CARE_PROVIDER_SITE_OTHER): Payer: Medicaid Other | Admitting: Obstetrics and Gynecology

## 2020-05-04 VITALS — BP 118/74 | Ht 62.0 in | Wt 114.0 lb

## 2020-05-04 DIAGNOSIS — N914 Secondary oligomenorrhea: Secondary | ICD-10-CM

## 2020-05-04 DIAGNOSIS — N920 Excessive and frequent menstruation with regular cycle: Secondary | ICD-10-CM

## 2020-05-04 DIAGNOSIS — Z124 Encounter for screening for malignant neoplasm of cervix: Secondary | ICD-10-CM | POA: Diagnosis present

## 2020-05-04 DIAGNOSIS — R87810 Cervical high risk human papillomavirus (HPV) DNA test positive: Secondary | ICD-10-CM | POA: Insufficient documentation

## 2020-05-04 DIAGNOSIS — B370 Candidal stomatitis: Secondary | ICD-10-CM

## 2020-05-04 MED ORDER — FLUCONAZOLE 150 MG PO TABS: 150.0000 mg | ORAL_TABLET | Freq: Once | ORAL | 0 refills | Status: DC

## 2020-05-04 MED ORDER — MEDROXYPROGESTERONE ACETATE 10 MG PO TABS: 10.0000 mg | ORAL_TABLET | Freq: Every day | ORAL | 0 refills | Status: DC

## 2020-05-04 NOTE — Progress Notes (Signed)
[facility]   Chief Complaint  Patient presents with  . Menstrual Problem  . Pelvic Pain    HPI:      [name] is a 34 y.o. No obstetric history on file. whose LMP was Patient's last menstrual period was 03/04/2020., presents today for NP > 3 yrs eval of no menses since 6/21. Menses usually monthly, lasting 5-6 days, mod flow, no BTB, mod dysmen. Has been heavier for the past 6 months, changing pads/tampons Q2hrs, with dime to quarter sized clots, and with "gushes". No BTB, no change in dysmen. Had neg UPT with PCP 04/26/20. Euthyroid 1/21. Hx of ovar cystectomy in past.  Had annual with HPV DNA testing only 04/26/20. Pos HPV DNA 16, no cytology done. Pt with hx of colpo and bx about 16 yrs ago, no tx needed. Normal paps until now. Colpo not scheduled. Pt concerned about results. Pt treated for cervicitis with doxy BID for 5 days (didn't do last 2 days due to diarrhea) with PCP 04/26/20. Neg gon/chlam results. Pt then developed yeast vag sx and possible thrush since she's had burning of the tongue. Treated with OTC yeast meds with sx relief vaginally.   She is sex active, using condoms. No pain/bleeding with sex. Did depo and OCPs in past with BTB. Wants to preserve fertility.    Past Medical History:  Diagnosis Date  . Anxiety   . Arthritis    hands, legs  . Cervical dysplasia 2005  . Cervical high risk HPV (human papillomavirus) test positive 2021  . Colitis   . Depression   . Dysrhythmia    PCP wants her to have wear a heart monitor  . Family history of adverse reaction to anesthesia    Mother difficult to awaken after Succinycholine, son had PONV  . GERD (gastroesophageal reflux disease)   . Migraine    Rare.  Tension HA 3x/wk  . Motion sickness   . Ovarian cyst   . Scoliosis   . TMJ arthritis   . TMJ syndrome     Past Surgical History:  Procedure Laterality Date  . COLONOSCOPY WITH PROPOFOL N/A 07/03/2016   Procedure: COLONOSCOPY WITH PROPOFOL;   Surgeon: Midge Minium, MD;  Location: [facility];  Service: Endoscopy;  Laterality: N/A;  . COLONOSCOPY WITH PROPOFOL N/A 04/04/2018   Procedure: COLONOSCOPY WITH biopsies;  Surgeon: Midge Minium, MD;  Location: [facility];  Service: Endoscopy;  Laterality: N/A;  . ESOPHAGOGASTRODUODENOSCOPY N/A 03/06/2016   Procedure: ESOPHAGOGASTRODUODENOSCOPY (EGD);  Surgeon: Scot Jun, MD;  [facility];  Service: Endoscopy;  Laterality: N/A;  . ESOPHAGOGASTRODUODENOSCOPY (EGD) WITH PROPOFOL N/A 04/04/2018   Procedure: ESOPHAGOGASTRODUODENOSCOPY (EGD) WITH biopsies;  Surgeon: Midge Minium, MD;  Location: [facility];  Service: Endoscopy;  Laterality: N/A;  . LAPAROSCOPIC OVARIAN CYSTECTOMY Left 03/07/2016   Procedure: LAPAROSCOPIC OVARIAN CYSTECTOMY;  Surgeon: Conard Novak, MD;  Location: [facility];  Service: Gynecology;  Laterality: Left;  . OVARIAN CYST REMOVAL    . TEMPOROMANDIBULAR JOINT SURGERY      Family History  Problem Relation Age of Onset  . Heart attack Mother   . Hypertension Father     Social History   Socioeconomic History  . Marital status: Single    Spouse name: Not on file  . Number of children: Not on file  . Years of education: Not on file  . Highest education level: Not on file  Occupational History  . Not on file  Tobacco Use  . Smoking status: Current Every Day Smoker    Packs/day: 0.75    Years: 10.00    Pack years: 7.50    Types: Cigarettes  . Smokeless tobacco: Never Used  Vaping Use  . Vaping Use: Never used  Substance and Sexual Activity  . Alcohol use: Yes    Alcohol/week: 0.0 standard drinks    Comment: occasionally  . Drug use: No  . Sexual activity: Yes    Birth control/protection: None  Other Topics Concern  . Not on file  Social History Narrative  . Not on file   Social Determinants of Health   Financial Resource Strain:   . Difficulty of Paying Living Expenses:   Food Insecurity:   . Worried About  Programme researcher, broadcasting/film/videounning Out of Food in the Last Year:   . Baristaan Out of Food in the Last Year:   Transportation Needs:   . Freight forwarderLack of Transportation (Medical):   Marland Kitchen. Lack of Transportation (Non-Medical):   Physical Activity:   . Days of Exercise per Week:   . Minutes of Exercise per Session:   Stress:   . Feeling of Stress :   Social Connections:   . Frequency of Communication with Friends and Family:   . Frequency of Social Gatherings with Friends and Family:   . Attends Religious Services:   . Active Member of Clubs or Organizations:   . Attends BankerClub or Organization Meetings:   Marland Kitchen. Marital Status:   Intimate Partner Violence:   . Fear of Current or Ex-Partner:   . Emotionally Abused:   Marland Kitchen. Physically Abused:   . Sexually Abused:     Outpatient Medications Prior to Visit  Medication Sig Dispense Refill  . alprazolam (XANAX) 2 MG tablet Take 2 mg by mouth 2 (two) times daily.     . fluticasone (FLONASE) 50 MCG/ACT nasal spray SPRAY 1 SPRAY INTO EACH NOSTRIL EVERY DAY    . pantoprazole (PROTONIX) 40 MG tablet Take 1 tablet (40 mg total) by mouth 2 (two) times daily. 60 tablet 6  . VYVANSE 60 MG capsule Take 60 mg by mouth daily.   0  . celecoxib (CELEBREX) 100 MG capsule TAKE 1 CAPSULE (100 MG TOTAL) BY MOUTH TWO (2) TIMES A DAY. (Patient not taking: Reported on 05/04/2020)  1  . clobetasol ointment (TEMOVATE) 0.05 % APPLY TO AFFECTED AREA TWICE A DAY (Patient not taking: Reported on 05/04/2020)  0  . diphenoxylate-atropine (LOMOTIL) 2.5-0.025 MG tablet Take 1 tablet by mouth 4 (four) times daily as needed for diarrhea or loose stools. (Patient not taking: Reported on 05/04/2020) 60 tablet 5  . FETZIMA 20 MG CP24  (Patient not taking: Reported on 05/04/2020)    . gabapentin (NEURONTIN) 300 MG capsule     . glycopyrrolate (ROBINUL) 1 MG tablet Take 2 mg by mouth 2 (two) times daily. (Patient not taking: Reported on 05/04/2020)    . hydrOXYzine (ATARAX/VISTARIL) 25 MG tablet Take 1 tablet (25 mg total) by mouth 3  (three) times daily as needed for itching. (Patient not taking: Reported on 05/04/2020) 20 tablet 0  . Multiple Vitamin (MULTIVITAMIN) tablet Take 1 tablet by mouth daily.  (Patient not taking: Reported on 05/04/2020)    . predniSONE (DELTASONE) 10 MG tablet Start 60 mg po day one, then 50 mg po day two, taper by 10 mg daily until complete. 21 tablet 0  . promethazine (PHENERGAN) 25 MG tablet Take 25 mg by mouth every 6 (six) hours as needed for  nausea or vomiting.    . sucralfate (CARAFATE) 1 GM/10ML suspension Take 10 mLs (1 g total) by mouth 4 (four) times daily as needed. (Patient not taking: Reported on 05/04/2020) 420 mL 3   No facility-administered medications prior to visit.      ROS:  Review of Systems  Constitutional: Positive for fatigue. Negative for fever and unexpected weight change.  Respiratory: Negative for cough, shortness of breath and wheezing.   Cardiovascular: Negative for chest pain, palpitations and leg swelling.  Gastrointestinal: Positive for nausea. Negative for blood in stool, constipation, diarrhea and vomiting.  Endocrine: Negative for cold intolerance, heat intolerance and polyuria.  Genitourinary: Positive for menstrual problem and vaginal discharge. Negative for dyspareunia, dysuria, flank pain, frequency, genital sores, hematuria, pelvic pain, urgency, vaginal bleeding and vaginal pain.  Musculoskeletal: Negative for back pain, joint swelling and myalgias.  Skin: Negative for rash.  Neurological: Negative for dizziness, syncope, light-headedness, numbness and headaches.  Hematological: Negative for adenopathy.  Psychiatric/Behavioral: Positive for agitation and dysphoric mood. Negative for confusion, sleep disturbance and suicidal ideas. The patient is not nervous/anxious.     OBJECTIVE:   Vitals:  BP 118/74   Ht 5\' 2"  (1.575 m)   Wt 114 lb (51.7 kg)   LMP 03/04/2020   BMI 20.85 kg/m   Physical Exam Vitals reviewed.  Constitutional:       Appearance: She is well-developed.  HENT:     Mouth/Throat:     Mouth: Mucous membranes are moist. No oral lesions.     Tongue: No lesions.     Pharynx: Oropharynx is clear.     Comments: MOUTH WITHOUT EVID OF THRUSH ON EXAM Pulmonary:     Effort: Pulmonary effort is normal.  Genitourinary:    General: Normal vulva.     Pubic Area: No rash.      Labia:        Right: No rash, tenderness or lesion.        Left: No rash, tenderness or lesion.      Vagina: Normal. No vaginal discharge, erythema or tenderness.     Cervix: Normal.     Uterus: Normal. Not enlarged and not tender.      Adnexa: Right adnexa normal and left adnexa normal.       Right: No mass or tenderness.         Left: No mass or tenderness.    Musculoskeletal:        General: Normal range of motion.     Cervical back: Normal range of motion.  Skin:    General: Skin is warm and dry.  Neurological:     General: No focal deficit present.     Mental Status: She is alert and oriented to person, place, and time.  Psychiatric:        Mood and Affect: Mood normal.        Behavior: Behavior normal.        Thought Content: Thought content normal.        Judgment: Judgment normal.     Assessment/Plan: Secondary oligomenorrhea - Plan: medroxyPROGESTERone (PROVERA) 10 MG tablet; Neg UPT with PCP. Rx provera. If has withdrawal bleed, pt to follow cycles to see if resume to normal. If no bleed, will eval further with addl labs.  Menorrhagia with regular cycle--see if menses resume with provera. If still heavy, will check labs and GYN u/s. Discussed BC options to treat sx.   Cervical cancer screening - Plan: Cytology - PAP  Cervical high risk human papillomavirus (HPV) DNA test positive; pos HPV DNA 16. Check cytology and refer for colpo with MD.  Ginette Pitman - Plan: fluconazole (DIFLUCAN) 150 MG tablet; neg exam. Treat empirically with diflucan. F/u prn.    Meds ordered this encounter  Medications  . fluconazole (DIFLUCAN)  150 MG tablet    Sig: Take 1 tablet (150 mg total) by mouth once for 1 dose.    Dispense:  1 tablet    Refill:  0    Order Specific Question:   Supervising Provider    Answer:   Nadara Mustard [id number]  . medroxyPROGESTERone (PROVERA) 10 MG tablet    Sig: Take 1 tablet (10 mg total) by mouth daily for 7 days.    Dispense:  7 tablet    Refill:  0    Order Specific Question:   Supervising Provider    Answer:   Nadara Mustard [[id number]]      Return in about 1 week (around 05/11/2020) for colpo with MD.  Ilona Sorrel. Kaylub Detienne, PA-C 05/04/2020 4:56 PM

## 2020-05-04 NOTE — Patient Instructions (Signed)
I value your feedback and entrusting us with your care. If you get a [facility] patient survey, I would appreciate you taking the time to let us know about your experience today. Thank you!  As of September 04, 2019, your lab results will be released to your MyChart immediately, before I even have a chance to see them. Please give me time to review them and contact you if there are any abnormalities. Thank you for your patience.  

## 2020-05-06 LAB — CYTOLOGY - PAP: Diagnosis: NEGATIVE

## 2020-06-01 ENCOUNTER — Other Ambulatory Visit: Payer: Self-pay

## 2020-06-01 ENCOUNTER — Other Ambulatory Visit (HOSPITAL_COMMUNITY)
Admission: RE | Admit: 2020-06-01 | Discharge: 2020-06-01 | Disposition: A | Discharge status: Home / Self Care | Payer: Medicaid Other | Source: Ambulatory Visit | Attending: Obstetrics and Gynecology | Admitting: Obstetrics and Gynecology

## 2020-06-01 ENCOUNTER — Encounter: Payer: Self-pay | Admitting: Obstetrics and Gynecology

## 2020-06-01 ENCOUNTER — Ambulatory Visit (INDEPENDENT_AMBULATORY_CARE_PROVIDER_SITE_OTHER): Payer: Medicaid Other | Admitting: Obstetrics and Gynecology

## 2020-06-01 VITALS — BP 122/70 | Ht 62.0 in | Wt 116.0 lb

## 2020-06-01 DIAGNOSIS — R87810 Cervical high risk human papillomavirus (HPV) DNA test positive: Secondary | ICD-10-CM | POA: Insufficient documentation

## 2020-06-01 DIAGNOSIS — R87619 Unspecified abnormal cytological findings in specimens from cervix uteri: Secondary | ICD-10-CM | POA: Diagnosis present

## 2020-06-01 NOTE — Progress Notes (Signed)
HPI:  Vicki Dominguez is a 34 y.o.  No obstetric history on file.  who presents today for evaluation and management of abnormal cervical cytology.    Dysplasia History:   8/2/2021L NILM, HPV 16+   OB History  No obstetric history on file.    Past Medical History:  Diagnosis Date  . Anxiety   . Arthritis    hands, legs  . Cervical dysplasia 2005  . Cervical high risk HPV (human papillomavirus) test positive 2021  . Colitis   . Depression   . Dysrhythmia    PCP wants her to have wear a heart monitor  . Family history of adverse reaction to anesthesia    Mother difficult to awaken after Succinycholine, son had PONV  . GERD (gastroesophageal reflux disease)   . Migraine    Rare.  Tension HA 3x/wk  . Motion sickness   . Ovarian cyst   . Scoliosis   . TMJ arthritis   . TMJ syndrome     Past Surgical History:  Procedure Laterality Date  . COLONOSCOPY WITH PROPOFOL N/A 07/03/2016   Procedure: COLONOSCOPY WITH PROPOFOL;  Surgeon: Midge Minium, MD;  Location: [facility];  Service: Endoscopy;  Laterality: N/A;  . COLONOSCOPY WITH PROPOFOL N/A 04/04/2018   Procedure: COLONOSCOPY WITH biopsies;  Surgeon: Midge Minium, MD;  Location: [facility];  Service: Endoscopy;  Laterality: N/A;  . ESOPHAGOGASTRODUODENOSCOPY N/A 03/06/2016   Procedure: ESOPHAGOGASTRODUODENOSCOPY (EGD);  Surgeon: Scot Jun, MD;  Location: [facility];  Service: Endoscopy;  Laterality: N/A;  . ESOPHAGOGASTRODUODENOSCOPY (EGD) WITH PROPOFOL N/A 04/04/2018   Procedure: ESOPHAGOGASTRODUODENOSCOPY (EGD) WITH biopsies;  Surgeon: Midge Minium, MD;  Location: [facility];  Service: Endoscopy;  Laterality: N/A;  . LAPAROSCOPIC OVARIAN CYSTECTOMY Left 03/07/2016   Procedure: LAPAROSCOPIC OVARIAN CYSTECTOMY;  Surgeon: Conard Novak, MD;  Location: [facility] ORS;  Service: Gynecology;  Laterality: Left;  . OVARIAN CYST REMOVAL    . TEMPOROMANDIBULAR JOINT SURGERY      SOCIAL  HISTORY:  Social History   Substance and Sexual Activity  Alcohol Use Yes  . Alcohol/week: 0.0 standard drinks   Comment: occasionally    Social History   Substance and Sexual Activity  Drug Use No     Family History  Problem Relation Age of Onset  . Heart attack Mother   . Hypertension Father     ALLERGIES:  Succinylcholine chloride and Tape  Current Outpatient Medications on File Prior to Visit  Medication Sig Dispense Refill  . alprazolam (XANAX) 2 MG tablet Take 2 mg by mouth 2 (two) times daily.     . celecoxib (CELEBREX) 100 MG capsule TAKE 1 CAPSULE (100 MG TOTAL) BY MOUTH TWO (2) TIMES A DAY. (Patient not taking: Reported on 05/04/2020)  1  . clobetasol ointment (TEMOVATE) 0.05 % APPLY TO AFFECTED AREA TWICE A DAY (Patient not taking: Reported on 05/04/2020)  0  . diphenoxylate-atropine (LOMOTIL) 2.5-0.025 MG tablet Take 1 tablet by mouth 4 (four) times daily as needed for diarrhea or loose stools. (Patient not taking: Reported on 05/04/2020) 60 tablet 5  . FETZIMA 20 MG CP24  (Patient not taking: Reported on 05/04/2020)    . fluticasone (FLONASE) 50 MCG/ACT nasal spray SPRAY 1 SPRAY INTO EACH NOSTRIL EVERY DAY    . gabapentin (NEURONTIN) 300 MG capsule     . glycopyrrolate (ROBINUL) 1 MG tablet Take 2 mg by mouth 2 (two) times daily. (Patient not taking: Reported on 05/04/2020)    .  hydrOXYzine (ATARAX/VISTARIL) 25 MG tablet Take 1 tablet (25 mg total) by mouth 3 (three) times daily as needed for itching. (Patient not taking: Reported on 05/04/2020) 20 tablet 0  . medroxyPROGESTERone (PROVERA) 10 MG tablet Take 1 tablet (10 mg total) by mouth daily for 7 days. 7 tablet 0  . Multiple Vitamin (MULTIVITAMIN) tablet Take 1 tablet by mouth daily.  (Patient not taking: Reported on 05/04/2020)    . pantoprazole (PROTONIX) 40 MG tablet Take 1 tablet (40 mg total) by mouth 2 (two) times daily. 60 tablet 6  . predniSONE (DELTASONE) 10 MG tablet Start 60 mg po day one, then 50 mg po day  two, taper by 10 mg daily until complete. 21 tablet 0  . promethazine (PHENERGAN) 25 MG tablet Take 25 mg by mouth every 6 (six) hours as needed for nausea or vomiting.    . sucralfate (CARAFATE) 1 GM/10ML suspension Take 10 mLs (1 g total) by mouth 4 (four) times daily as needed. (Patient not taking: Reported on 05/04/2020) 420 mL 3  . VYVANSE 60 MG capsule Take 60 mg by mouth daily.   0   No current facility-administered medications on file prior to visit.    Physical Exam: -Vitals:  BP 122/70   Ht 5\' 2"  (1.575 m)   Wt 116 lb (52.6 kg)   LMP 05/17/2020   BMI 21.22 kg/m  GEN: WD, WN, NAD.  A+ O x 3, good mood and affect. ABD:  NT, ND.  Soft, no masses.  No hernias noted.   Pelvic:   Vulva: Normal appearance.  No lesions.  Vagina: No lesions or abnormalities noted.  Support: Normal pelvic support.  Urethra No masses tenderness or scarring.  Meatus Normal size without lesions or prolapse.  Cervix: See below.  Anus: Normal exam.  No lesions.  Perineum: Normal exam.  No lesions.        Bimanual   Uterus: Normal size.  Non-tender.  Mobile.  AV.  Adnexae: No masses.  Non-tender to palpation.  Cul-de-sac: Negative for abnormality.   PROCEDURE: 1.  Urine Pregnancy Test:  negative 2.  Colposcopy performed with 4% acetic acid after verbal consent obtained                                         -Aceto-white Lesions Location(s): mild at SCJ               -Biopsy performed at 6 and 2 o'clock (random)              -ECC indicated and performed: Yes.       -Biopsy sites made hemostatic with pressure, AgNO3, and/or Monsel's solution   -Satisfactory colposcopy: Yes.      -Evidence of Invasive cervical CA :  NO  ASSESSMENT:  Vicki Dominguez is a 34 y.o. No obstetric history on file. here for  1. Cervical high risk human papillomavirus (HPV) DNA test positive   2. Abnormal cervical Papanicolaou smear, unspecified abnormal pap finding   .  PLAN:  I discussed the grading system of pap  smears and HPV high risk viral types.  We will discuss and base management after colpo results return.       20, MD  [facility], [facility] 06/01/2020  2:23 PM

## 2020-06-03 LAB — SURGICAL PATHOLOGY

## 2020-07-15 ENCOUNTER — Other Ambulatory Visit: Payer: Self-pay | Admitting: Obstetrics and Gynecology

## 2020-07-15 DIAGNOSIS — B370 Candidal stomatitis: Secondary | ICD-10-CM

## 2020-09-12 ENCOUNTER — Other Ambulatory Visit: Payer: Self-pay | Admitting: Obstetrics and Gynecology

## 2020-09-12 DIAGNOSIS — N914 Secondary oligomenorrhea: Secondary | ICD-10-CM

## 2020-09-26 ENCOUNTER — Encounter: Payer: Self-pay | Admitting: Obstetrics and Gynecology

## 2020-09-26 ENCOUNTER — Other Ambulatory Visit: Payer: Self-pay | Admitting: Obstetrics and Gynecology

## 2020-09-26 DIAGNOSIS — N914 Secondary oligomenorrhea: Secondary | ICD-10-CM

## 2020-09-30 ENCOUNTER — Other Ambulatory Visit: Payer: Medicaid Other

## 2020-09-30 ENCOUNTER — Other Ambulatory Visit: Payer: Self-pay

## 2020-09-30 DIAGNOSIS — N914 Secondary oligomenorrhea: Secondary | ICD-10-CM

## 2020-10-02 LAB — TSH+FREE T4
Free T4: 1.25 ng/dL (ref 0.82–1.77)
TSH: 1.69 u[IU]/mL (ref 0.450–4.500)

## 2020-10-02 LAB — FSH/LH
FSH: 6.1 m[IU]/mL
LH: 25 m[IU]/mL

## 2020-10-02 LAB — ESTRADIOL: Estradiol: 168 pg/mL

## 2020-10-02 LAB — TESTOSTERONE,FREE AND TOTAL
Testosterone, Free: 0.8 pg/mL (ref 0.0–4.2)
Testosterone: 58 ng/dL (ref 8–60)

## 2020-10-02 LAB — PROGESTERONE: Progesterone: 0.6 ng/mL

## 2020-10-02 LAB — PROLACTIN: Prolactin: 9.3 ng/mL (ref 4.8–23.3)

## 2020-10-04 ENCOUNTER — Other Ambulatory Visit: Payer: Self-pay | Admitting: Obstetrics and Gynecology

## 2020-10-04 ENCOUNTER — Encounter: Payer: Self-pay | Admitting: Obstetrics and Gynecology

## 2020-10-04 DIAGNOSIS — N914 Secondary oligomenorrhea: Secondary | ICD-10-CM

## 2020-10-04 MED ORDER — METRONIDAZOLE 500 MG PO TABS: 500.0000 mg | ORAL_TABLET | Freq: Two times a day (BID) | ORAL | 0 refills | Status: DC

## 2020-10-04 MED ORDER — MEDROXYPROGESTERONE ACETATE 10 MG PO TABS
10.0000 mg | ORAL_TABLET | Freq: Every day | ORAL | 0 refills | Status: DC
Start: 2020-10-04 — End: 2021-06-09

## 2020-10-04 NOTE — Progress Notes (Signed)
Rx RF provera for amenorrhea

## 2020-10-04 NOTE — Progress Notes (Signed)
Rx flagyl for BV sx.  

## 2020-10-05 ENCOUNTER — Encounter: Payer: Self-pay | Admitting: Obstetrics and Gynecology

## 2020-10-05 ENCOUNTER — Other Ambulatory Visit: Payer: Self-pay | Admitting: Obstetrics and Gynecology

## 2020-10-05 MED ORDER — METRONIDAZOLE 0.75 % VA GEL: 1.0000 | Freq: Every day | VAGINAL | 0 refills | Status: DC

## 2020-10-07 ENCOUNTER — Encounter: Payer: Self-pay | Admitting: Obstetrics and Gynecology

## 2020-10-07 ENCOUNTER — Other Ambulatory Visit: Payer: Self-pay | Admitting: Obstetrics and Gynecology

## 2020-10-07 MED ORDER — CLINDAMYCIN HCL 300 MG PO CAPS: 300.0000 mg | ORAL_CAPSULE | Freq: Two times a day (BID) | ORAL | 0 refills | Status: AC

## 2020-10-07 NOTE — Progress Notes (Signed)
Rx clindamycin for BV sx 

## 2020-10-08 ENCOUNTER — Encounter: Payer: Self-pay | Admitting: Obstetrics and Gynecology

## 2020-11-22 ENCOUNTER — Encounter: Payer: Self-pay | Admitting: Obstetrics and Gynecology

## 2020-12-12 ENCOUNTER — Emergency Department
Admission: EM | Admit: 2020-12-12 | Discharge: 2020-12-12 | Disposition: A | Discharge status: Home / Self Care | Payer: Medicaid Other | Attending: Emergency Medicine | Admitting: Emergency Medicine

## 2020-12-12 ENCOUNTER — Emergency Department: Payer: Medicaid Other

## 2020-12-12 ENCOUNTER — Other Ambulatory Visit: Payer: Self-pay

## 2020-12-12 ENCOUNTER — Ambulatory Visit: Admit: 2020-12-12 | Discharge status: Against Medical Advice | Payer: Medicaid Other

## 2020-12-12 ENCOUNTER — Encounter: Payer: Self-pay | Admitting: Emergency Medicine

## 2020-12-12 DIAGNOSIS — N939 Abnormal uterine and vaginal bleeding, unspecified: Secondary | ICD-10-CM

## 2020-12-12 DIAGNOSIS — F17210 Nicotine dependence, cigarettes, uncomplicated: Secondary | ICD-10-CM | POA: Diagnosis not present

## 2020-12-12 LAB — CHLAMYDIA/NGC RT PCR (ARMC ONLY)
Chlamydia Tr: NOT DETECTED
N gonorrhoeae: NOT DETECTED

## 2020-12-12 LAB — CBC
HCT: 35.5 % — ABNORMAL LOW (ref 36.0–46.0)
Hemoglobin: 11.4 g/dL — ABNORMAL LOW (ref 12.0–15.0)
MCH: 28.8 pg (ref 26.0–34.0)
MCHC: 32.1 g/dL (ref 30.0–36.0)
MCV: 89.6 fL (ref 80.0–100.0)
Platelets: 152 10*3/uL (ref 150–400)
RBC: 3.96 MIL/uL (ref 3.87–5.11)
RDW: 12.2 % (ref 11.5–15.5)
WBC: 4.6 10*3/uL (ref 4.0–10.5)
nRBC: 0 % (ref 0.0–0.2)

## 2020-12-12 LAB — SAMPLE TO BLOOD BANK

## 2020-12-12 LAB — URINALYSIS, COMPLETE (UACMP) WITH MICROSCOPIC
RBC / HPF: >50 RBC/hpf — ABNORMAL HIGH (ref 0–5)
Specific Gravity, Urine: 1.008 (ref 1.005–1.030)
Squamous Epithelial / HPF: NONE SEEN (ref 0–5)

## 2020-12-12 LAB — COMPREHENSIVE METABOLIC PANEL
ALT: 30 U/L (ref 0–44)
AST: 57 U/L — ABNORMAL HIGH (ref 15–41)
Albumin: 3.9 g/dL (ref 3.5–5.0)
Alkaline Phosphatase: 54 U/L (ref 38–126)
Anion gap: 8 (ref 5–15)
BUN: 12 mg/dL (ref 6–20)
CO2: 23 mmol/L (ref 22–32)
Calcium: 8.6 mg/dL — ABNORMAL LOW (ref 8.9–10.3)
Chloride: 103 mmol/L (ref 98–111)
Creatinine, Ser: 0.59 mg/dL (ref 0.44–1.00)
GFR, Estimated: >60 mL/min (ref >60)
Glucose, Bld: 100 mg/dL — ABNORMAL HIGH (ref 70–99)
Potassium: 3.5 mmol/L (ref 3.5–5.1)
Sodium: 134 mmol/L — ABNORMAL LOW (ref 135–145)
Total Bilirubin: 0.7 mg/dL (ref 0.3–1.2)
Total Protein: 7.2 g/dL (ref 6.5–8.1)

## 2020-12-12 LAB — WET PREP, GENITAL
Clue Cells Wet Prep HPF POC: NONE SEEN
Sperm: NONE SEEN
Trich, Wet Prep: NONE SEEN
Yeast Wet Prep HPF POC: NONE SEEN

## 2020-12-12 LAB — LIPASE, BLOOD: Lipase: 42 U/L (ref 11–51)

## 2020-12-12 LAB — POC URINE PREG, ED: Preg Test, Ur: NEGATIVE

## 2020-12-12 NOTE — ED Provider Notes (Signed)
[facility]-EMERGENCY DEPARTMENT  ____________________________________________  Time seen: Approximately 5:51 PM  I have reviewed the triage vital signs and the nursing notes.   HISTORY  Chief Complaint Vaginal Bleeding   Historian Patient     HPI Vicki Dominguez is a 35 y.o. female presents to the emergency department with heavy vaginal bleeding that started on Friday.  Patient states that she has already had a menses this month and became concerned.  She states that she does have a history of ovarian cysts.  She states that she has had some cramping but not significant pain.  No pain in the back.  No dysuria or increased urinary frequency.  Patient states that she has had abnormal vaginal bleeding in the past and had to be placed on supplemental hormones by her OB/GYN.  Patient states that she is not currently taking any birth control medications.  She denies changes in vaginal discharge or concerns for STDs.   Past Medical History:  Diagnosis Date  . Anxiety   . Arthritis    hands, legs  . Cervical dysplasia 2005  . Cervical high risk HPV (human papillomavirus) test positive 2021  . Colitis   . Depression   . Dysrhythmia    PCP wants her to have wear a heart monitor  . Family history of adverse reaction to anesthesia    Mother difficult to awaken after Succinycholine, son had PONV  . GERD (gastroesophageal reflux disease)   . Migraine    Rare.  Tension HA 3x/wk  . Motion sickness   . Ovarian cyst   . Scoliosis   . TMJ arthritis   . TMJ syndrome      Immunizations up to date:  Yes.     Past Medical History:  Diagnosis Date  . Anxiety   . Arthritis    hands, legs  . Cervical dysplasia 2005  . Cervical high risk HPV (human papillomavirus) test positive 2021  . Colitis   . Depression   . Dysrhythmia    PCP wants her to have wear a heart monitor  . Family history of adverse reaction to anesthesia    Mother difficult to awaken after Succinycholine, son had PONV   . GERD (gastroesophageal reflux disease)   . Migraine    Rare.  Tension HA 3x/wk  . Motion sickness   . Ovarian cyst   . Scoliosis   . TMJ arthritis   . TMJ syndrome     Patient Active Problem List   Diagnosis Date Noted  . Cervical high risk human papillomavirus (HPV) DNA test positive 05/04/2020  . False positive ana 11/11/2018  . Polyarthralgia 08/28/2018  . Positive ANA (antinuclear antibody) 08/28/2018  . Screening for osteoporosis 08/28/2018  . Back pain 06/24/2018  . Diarrhea   . Scoliosis 03/22/2018  . Tachycardia 03/12/2018  . Rash 02/11/2018  . Chest wall pain 12/03/2017  . Opioid use disorder, severe, dependence (HCC) 01/24/2017  . Tobacco use disorder 01/24/2017  . Cannabis use disorder, moderate, dependence (HCC) 01/24/2017  . Severe recurrent major depression without psychotic features (HCC) 01/23/2017  . GERD (gastroesophageal reflux disease) 02/28/2016  . Depression 05/20/2012  . TMJ (temporomandibular joint syndrome) 11/23/2009  . History of cardiac arrhythmia 11/24/2003    Past Surgical History:  Procedure Laterality Date  . COLONOSCOPY WITH PROPOFOL N/A 07/03/2016   Procedure: COLONOSCOPY WITH PROPOFOL;  Surgeon: Midge Miniumarren Wohl, MD;  [facility];  Service: Endoscopy;  Laterality: N/A;  . COLONOSCOPY WITH PROPOFOL N/A 04/04/2018  Procedure: COLONOSCOPY WITH biopsies;  Surgeon: Midge Minium, MD;  Location: [facility];  Service: Endoscopy;  Laterality: N/A;  . ESOPHAGOGASTRODUODENOSCOPY N/A 03/06/2016   Procedure: ESOPHAGOGASTRODUODENOSCOPY (EGD);  Surgeon: Scot Jun, MD;  Location: [facility];  Service: Endoscopy;  Laterality: N/A;  . ESOPHAGOGASTRODUODENOSCOPY (EGD) WITH PROPOFOL N/A 04/04/2018   Procedure: ESOPHAGOGASTRODUODENOSCOPY (EGD) WITH biopsies;  Surgeon: Midge Minium, MD;  Location: [facility];  Service: Endoscopy;  Laterality: N/A;  . LAPAROSCOPIC OVARIAN CYSTECTOMY Left 03/07/2016   Procedure:  LAPAROSCOPIC OVARIAN CYSTECTOMY;  Surgeon: Conard Novak, MD;  Location: [facility] OR[facility];  Service: Gynecology;  Laterality: Left;  . OVARIAN CYST REMOVAL    . TEMPOROMANDIBULAR JOINT SURGERY      Prior to Admission medications   Medication Sig Start Date End Date Taking? Authorizing Provider  alprazolam Prudy Feeler) 2 MG tablet Take 2 mg by mouth 2 (two) times daily.     [provider]  celecoxib (CELEBREX) 100 MG capsule TAKE 1 CAPSULE (100 MG TOTAL) BY MOUTH TWO (2) TIMES A DAY. Patient not taking: Reported on 05/04/2020 02/11/18   [provider]  clobetasol ointment (TEMOVATE) 0.05 % APPLY TO AFFECTED AREA TWICE A DAY Patient not taking: Reported on 05/04/2020 02/11/18   [provider]  diphenoxylate-atropine (LOMOTIL) 2.5-0.025 MG tablet Take 1 tablet by mouth 4 (four) times daily as needed for diarrhea or loose stools. Patient not taking: Reported on 05/04/2020 04/05/18   Midge Minium, MD  FETZIMA 20 MG CP24  01/20/19   [provider]  fluticasone Aleda Grana) 50 MCG/ACT nasal spray SPRAY 1 SPRAY INTO EACH NOSTRIL EVERY DAY 02/03/19   [provider]  gabapentin (NEURONTIN) 300 MG capsule  05/19/19   [provider]  glycopyrrolate (ROBINUL) 1 MG tablet Take 2 mg by mouth 2 (two) times daily. Patient not taking: Reported on 05/04/2020 02/03/19   [provider]  hydrOXYzine (ATARAX/VISTARIL) 25 MG tablet Take 1 tablet (25 mg total) by mouth 3 (three) times daily as needed for itching. Patient not taking: Reported on 05/04/2020 06/30/19   Renford Dills, NP  medroxyPROGESTERone (PROVERA) 10 MG tablet Take 1 tablet (10 mg total) by mouth daily for 7 days. 10/04/20 10/11/20  Copland, Ilona Sorrel, PA-C  Multiple Vitamin (MULTIVITAMIN) tablet Take 1 tablet by mouth daily.  Patient not taking: Reported on 05/04/2020    [provider]  pantoprazole (PROTONIX) 40 MG tablet Take 1 tablet (40 mg total) by mouth 2 (two) times daily. 01/13/20   Midge Minium, MD  predniSONE (DELTASONE) 10 MG tablet Start 60 mg po day one, then 50 mg po day two, taper by 10 mg daily until complete. 06/30/19   Renford Dills, NP  promethazine (PHENERGAN) 25 MG tablet Take 25 mg by mouth every 6 (six) hours as needed for nausea or vomiting.    [provider]  sucralfate (CARAFATE) 1 GM/10ML suspension Take 10 mLs (1 g total) by mouth 4 (four) times daily as needed. Patient not taking: Reported on 05/04/2020 01/13/20   Midge Minium, MD  VYVANSE 60 MG capsule Take 60 mg by mouth daily.  07/23/17   [provider]    Allergies Succinylcholine chloride, Flagyl [metronidazole], and Tape  Family History  Problem Relation Age of Onset  . Heart attack Mother   . Hypertension Father     Social History Social History   Tobacco Use  . Smoking status: Current Every Day Smoker    Packs/day: 0.75    Years: 10.00  Pack years: 7.50    Types: Cigarettes  . Smokeless tobacco: Never Used  Vaping Use  . Vaping Use: Never used  Substance Use Topics  . Alcohol use: Yes    Alcohol/week: 0.0 standard drinks    Comment: occasionally  . Drug use: No     Review of Systems  Constitutional: No fever/chills Eyes:  No discharge ENT: No upper respiratory complaints. Respiratory: no cough. No SOB/ use of accessory muscles to breath Gastrointestinal:   No nausea, no vomiting.  No diarrhea.  No constipation. Genitourinary: Patient has vaginal bleeding.  Musculoskeletal: Negative for musculoskeletal pain. Skin: Negative for rash, abrasions, lacerations, ecchymosis.   ____________________________________________   PHYSICAL EXAM:  VITAL SIGNS: ED Triage Vitals  Enc Vitals Group     BP 12/12/20 1549 (!) 136/100     Pulse Rate 12/12/20 1549 (!) 117     Resp 12/12/20 1549 16     Temp 12/12/20 1549 98.2 F (36.8 C)     Temp Source 12/12/20 1549 Oral     SpO2 12/12/20 1549 100 %     Weight 12/12/20 1554 112 lb (50.8 kg)     Height 12/12/20  1554 5\' 2"  (1.575 m)     Head Circumference --      Peak Flow --      Pain Score 12/12/20 1553 4     Pain Loc --      Pain Edu? --      Excl. in GC? --      Constitutional: Alert and oriented. Well appearing and in no acute distress. Eyes: Conjunctivae are normal. PERRL. EOMI. Head: Atraumatic. ENT: Cardiovascular: Normal rate, regular rhythm. Normal S1 and S2.  Good peripheral circulation. Respiratory: Normal respiratory effort without tachypnea or retractions. Lungs CTAB. Good air entry to the bases with no decreased or absent breath sounds Gastrointestinal: Bowel sounds x 4 quadrants. Soft and nontender to palpation. No guarding or rigidity. No distention. Genitourinary: Patient has blood in the vaginal vault.  No adnexal masses palpated. Musculoskeletal: Full range of motion to all extremities. No obvious deformities noted Neurologic:  Normal for age. No gross focal neurologic deficits are appreciated.  Skin:  Skin is warm, dry and intact. No rash noted. Psychiatric: Mood and affect are normal for age. Speech and behavior are normal.   ____________________________________________   LABS (all labs ordered are listed, but only abnormal results are displayed)  Labs Reviewed  COMPREHENSIVE METABOLIC PANEL - Abnormal; Notable for the following components:      Result Value   Sodium 134 (*)    Glucose, Bld 100 (*)    Calcium 8.6 (*)    AST 57 (*)    All other components within normal limits  CBC - Abnormal; Notable for the following components:   Hemoglobin 11.4 (*)    HCT 35.5 (*)    All other components within normal limits  URINALYSIS, COMPLETE (UACMP) WITH MICROSCOPIC - Abnormal; Notable for the following components:   Color, Urine RED (*)    APPearance CLOUDY (*)    Glucose, UA   (*)    Value: TEST NOT REPORTED DUE TO COLOR INTERFERENCE OF URINE PIGMENT   Hgb urine dipstick   (*)    Value: TEST NOT REPORTED DUE TO COLOR INTERFERENCE OF URINE PIGMENT   Bilirubin Urine    (*)    Value: TEST NOT REPORTED DUE TO COLOR INTERFERENCE OF URINE PIGMENT   Ketones, ur   (*)    Value: TEST NOT  REPORTED DUE TO COLOR INTERFERENCE OF URINE PIGMENT   Protein, ur   (*)    Value: TEST NOT REPORTED DUE TO COLOR INTERFERENCE OF URINE PIGMENT   Nitrite   (*)    Value: TEST NOT REPORTED DUE TO COLOR INTERFERENCE OF URINE PIGMENT   Leukocytes,Ua   (*)    Value: TEST NOT REPORTED DUE TO COLOR INTERFERENCE OF URINE PIGMENT   RBC / HPF >50 (*)    Bacteria, UA RARE (*)    All other components within normal limits  CHLAMYDIA/NGC RT PCR ([facility] ONLY)  WET PREP, GENITAL  LIPASE, BLOOD  POC URINE PREG, ED  POC URINE PREG, ED  SAMPLE TO BLOOD BANK   ____________________________________________  EKG   ____________________________________________  RADIOLOGY Geraldo Pitter, personally viewed and evaluated these images (plain radiographs) as part of my medical decision making, as well as reviewing the written report by the radiologist.  US PELVIC COMPLETE W TRANSVAGINAL AND TORSION R/O  Result Date: 12/12/2020 CLINICAL DATA:  Heavy vaginal bleeding for 3 days. EXAM: TRANSABDOMINAL AND TRANSVAGINAL ULTRASOUND OF PELVIS DOPPLER ULTRASOUND OF OVARIES TECHNIQUE: Both transabdominal and transvaginal ultrasound examinations of the pelvis were performed. Transabdominal technique was performed for global imaging of the pelvis including uterus, ovaries, adnexal regions, and pelvic cul-de-sac. It was necessary to proceed with endovaginal exam following the transabdominal exam to visualize the ovaries and endometrium. Color and duplex Doppler ultrasound was utilized to evaluate blood flow to the ovaries. COMPARISON:  Pelvic ultrasound 04/16/2017 FINDINGS: Uterus Measurements: 8.6 x 4.3 x 5.4 cm = volume: 105 mL. No fibroids or other mass visualized. Endometrium Thickness: 0.7 cm.  No focal abnormality visualized. Right ovary Measurements: 4.1 x 3.9 x 3.8 cm = volume: 32 mL. There is  a simple cyst in the right ovary measuring 4.1 x 3.8 x 3.6 cm. Left ovary Measurements: 2.2 x 2.4 x 1.3 = volume: 3.5 mL. Normal appearance/no adnexal mass. Pulsed Doppler evaluation of both ovaries demonstrates normal low-resistance arterial and venous waveforms. Other findings No abnormal free fluid. IMPRESSION: 1. No sonographic finding to explain the patient's vaginal bleeding. 2. There is a 4.1 cm simple cyst in the right ovary. Recommend follow-up ultrasound in 6-8 weeks to ensure resolution. Electronically Signed   By: Emmaline Kluver M.D.   On: 12/12/2020 17:32    ____________________________________________    PROCEDURES  Procedure(s) performed:     Procedures     Medications - No data to display   ____________________________________________   INITIAL IMPRESSION / ASSESSMENT AND PLAN / ED COURSE  Pertinent labs & imaging results that were available during my care of the patient were reviewed by me and considered in my medical decision making (see chart for details).       Assessment and plan Vaginal bleeding 35 year old female presents to the emergency department with vaginal bleeding since Friday.  Patient was mildly tachycardic at triage but vital signs were otherwise reassuring.  On exam, patient had blood in vaginal vault cervix visualized and no adnexal masses palpated.  Pelvic ultrasound shows 4.1 cm ovarian cyst on the right but no other acute abnormalities.  Patient H&H was reassuring on CBC.  Recommended follow-up with her OB/GYN at [facility] to discuss management options for menorrhagia.  All patient questions were answered.    ____________________________________________  FINAL CLINICAL IMPRESSION(S) / ED DIAGNOSES  Final diagnoses:  Vaginal bleeding      NEW MEDICATIONS STARTED DURING THIS VISIT:  ED Discharge Orders    None  This chart was dictated using voice recognition software/Dragon. Despite best efforts to proofread,  errors can occur which can change the meaning. Any change was purely unintentional.     Orvil Feil, PA-C 12/12/20 1757    Gilles Chiquito, MD 12/13/20 [phone]

## 2020-12-12 NOTE — Discharge Instructions (Signed)
Please follow up with [facility].

## 2020-12-12 NOTE — ED Triage Notes (Addendum)
Patient presents to the ED with very heavy vaginal bleeding since Friday.  Patient states she is changing pads at least every hour and occasionally more frequently.  Patient states this is her 2nd heavy period within the month.  Patient had multiple skipped periods earlier in the year.  Patient also reports lower abdominal pain/cramping.  Denies nausea, vomiting and dysuria.

## 2020-12-13 ENCOUNTER — Encounter: Payer: Self-pay | Admitting: Obstetrics and Gynecology

## 2020-12-13 ENCOUNTER — Telehealth: Payer: Medicaid Other | Admitting: Emergency Medicine

## 2020-12-13 DIAGNOSIS — N939 Abnormal uterine and vaginal bleeding, unspecified: Secondary | ICD-10-CM

## 2020-12-13 MED ORDER — NORETHINDRONE ACETATE 5 MG PO TABS: 5.0000 mg | ORAL_TABLET | Freq: Every day | ORAL | 0 refills | Status: DC

## 2020-12-13 NOTE — Progress Notes (Signed)
Hi Vicki Dominguez,  It appears your OB/GYN, Helmut Muster Copland PA-C, just replied to your MyChart message at 10:29AM.  However, if you would prefer a another opinion or treatment option, it is recommended you be re-seen in person today.  Based on what you shared with me, I feel your condition warrants further evaluation and I recommend that you be seen for a face to face office visit.   NOTE: If you entered your credit card information for this eVisit, you will not be charged. You may see a "hold" on your card for the $35 but that hold will drop off and you will not have a charge processed.   If you are having a true medical emergency please call 911.      For an urgent face to face visit, [facility] has five urgent care centers for your convenience:     [facility] at [facility] Directions [phone] [address] [place], [place] [address] . 10 am - 6pm Monday - Friday    [facility]) Get Driving Directions [phone] [address] [place], [place] [address] . 10 am to 8 pm Monday-Friday . 12 pm to 8 pm [facility] at [place] Get Driving Directions [phone] [address] [place], [place] [address] . 8 am to 8 pm Monday-Friday . 9 am to 6 pm Saturday . 11 am to 6 pm Sunday     [facility] at [facility] Get Driving Directions  [phone] [address] [place], [place] [address] . 8 am to 8 pm Monday-Friday . 8 am to 4 pm [facility] Urgent Care at [facility] Directions [phone] [address] [place], [place] [address] . 12 pm to 6 pm Monday-Friday      Your e-visit answers were reviewed by a board certified advanced clinical practitioner to complete your personal care plan.  Thank you for using e-Visits.    Approximately 5 minutes was spent documenting and  reviewing patient's chart.

## 2021-01-24 ENCOUNTER — Encounter: Payer: Self-pay | Admitting: Obstetrics and Gynecology

## 2021-02-15 ENCOUNTER — Encounter: Payer: Self-pay | Admitting: Obstetrics and Gynecology

## 2021-02-18 ENCOUNTER — Other Ambulatory Visit: Payer: Self-pay | Admitting: Obstetrics and Gynecology

## 2021-02-18 DIAGNOSIS — B370 Candidal stomatitis: Secondary | ICD-10-CM

## 2021-02-18 MED ORDER — FLUCONAZOLE 150 MG PO TABS: 150.0000 mg | ORAL_TABLET | Freq: Once | ORAL | 0 refills | Status: AC

## 2021-02-18 NOTE — Progress Notes (Signed)
Rx diflucan prn yeast vag sx

## 2021-04-10 ENCOUNTER — Ambulatory Visit
Admission: EM | Admit: 2021-04-10 | Discharge: 2021-04-10 | Disposition: A | Discharge status: Home / Self Care | Payer: Medicaid Other | Attending: Family Medicine | Admitting: Family Medicine

## 2021-04-10 ENCOUNTER — Other Ambulatory Visit: Payer: Self-pay

## 2021-04-10 DIAGNOSIS — F17210 Nicotine dependence, cigarettes, uncomplicated: Secondary | ICD-10-CM | POA: Diagnosis not present

## 2021-04-10 DIAGNOSIS — Z793 Long term (current) use of hormonal contraceptives: Secondary | ICD-10-CM | POA: Diagnosis not present

## 2021-04-10 DIAGNOSIS — R0981 Nasal congestion: Secondary | ICD-10-CM | POA: Insufficient documentation

## 2021-04-10 DIAGNOSIS — Z881 Allergy status to other antibiotic agents status: Secondary | ICD-10-CM | POA: Diagnosis not present

## 2021-04-10 DIAGNOSIS — B9789 Other viral agents as the cause of diseases classified elsewhere: Secondary | ICD-10-CM

## 2021-04-10 DIAGNOSIS — J988 Other specified respiratory disorders: Secondary | ICD-10-CM | POA: Diagnosis not present

## 2021-04-10 DIAGNOSIS — Z888 Allergy status to other drugs, medicaments and biological substances status: Secondary | ICD-10-CM | POA: Diagnosis not present

## 2021-04-10 DIAGNOSIS — Z20822 Contact with and (suspected) exposure to covid-19: Secondary | ICD-10-CM | POA: Insufficient documentation

## 2021-04-10 DIAGNOSIS — J029 Acute pharyngitis, unspecified: Secondary | ICD-10-CM | POA: Diagnosis not present

## 2021-04-10 LAB — POC SARS CORONAVIRUS 2 AG: SARSCOV2ONAVIRUS 2 AG: NEGATIVE

## 2021-04-10 NOTE — ED Triage Notes (Signed)
Pt c/o sore throat and runny nose for several days. Pt's son has tested positive for COVID.

## 2021-04-10 NOTE — Discharge Instructions (Addendum)
If COVID testing is positive, we can send in treatment.  Rest. Fluids.  Tylenol and Ibuprofen as needed.

## 2021-04-10 NOTE — ED Provider Notes (Signed)
[facility]    CSN: [id number] Arrival date & time: 04/10/21  1306      History   Chief Complaint Chief Complaint  Patient presents with   Nasal Congestion   Sore Throat   Covid Exposure   HPI 35 year old female presents for evaluation of respiratory complaints.  Patient reports that her son had a positive home COVID test.  She developed symptoms yesterday.  She reports sore throat and runny nose.  Denies fever.  She is otherwise feeling well.  No relieving factors.  No other associated symptoms.  Desires testing for COVID today.  Past Medical History:  Diagnosis Date   Anxiety    Arthritis    hands, legs   Cervical dysplasia 2005   Cervical high risk HPV (human papillomavirus) test positive 2021   Colitis    Depression    Dysrhythmia    PCP wants her to have wear a heart monitor   Family history of adverse reaction to anesthesia    Mother difficult to awaken after Succinycholine, son had PONV   GERD (gastroesophageal reflux disease)    Migraine    Rare.  Tension HA 3x/wk   Motion sickness    Ovarian cyst    Scoliosis    TMJ arthritis    TMJ syndrome     Patient Active Problem List   Diagnosis Date Noted   Cervical high risk human papillomavirus (HPV) DNA test positive 05/04/2020   False positive ana 11/11/2018   Polyarthralgia 08/28/2018   Positive ANA (antinuclear antibody) 08/28/2018   Screening for osteoporosis 08/28/2018   Back pain 06/24/2018   Diarrhea    Scoliosis 03/22/2018   Tachycardia 03/12/2018   Rash 02/11/2018   Chest wall pain 12/03/2017   Opioid use disorder, severe, dependence (HCC) 01/24/2017   Tobacco use disorder 01/24/2017   Cannabis use disorder, moderate, dependence (HCC) 01/24/2017   Severe recurrent major depression without psychotic features (HCC) 01/23/2017   GERD (gastroesophageal reflux disease) 02/28/2016   Depression 05/20/2012   TMJ (temporomandibular joint syndrome) 11/23/2009   History of cardiac  arrhythmia 11/24/2003    Past Surgical History:  Procedure Laterality Date   COLONOSCOPY WITH PROPOFOL N/A 07/03/2016   Procedure: COLONOSCOPY WITH PROPOFOL;  Surgeon: Midge Minium, MD;  Location: [facility];  Service: Endoscopy;  Laterality: N/A;   COLONOSCOPY WITH PROPOFOL N/A 04/04/2018   Procedure: COLONOSCOPY WITH biopsies;  Surgeon: Midge Minium, MD;  Location: [facility];  Service: Endoscopy;  Laterality: N/A;   ESOPHAGOGASTRODUODENOSCOPY N/A 03/06/2016   Procedure: ESOPHAGOGASTRODUODENOSCOPY (EGD);  Surgeon: Scot Jun, MD;  Location: [facility] END[facility]COPY;  Service: Endoscopy;  Laterality: N/A;   ESOPHAGOGASTRODUODENOSCOPY (EGD) WITH PROPOFOL N/A 04/04/2018   Procedure: ESOPHAGOGASTRODUODENOSCOPY (EGD) WITH biopsies;  Surgeon: Midge Minium, MD;  Location: [facility];  Service: Endoscopy;  Laterality: N/A;   LAPAROSCOPIC OVARIAN CYSTECTOMY Left 03/07/2016   Procedure: LAPAROSCOPIC OVARIAN CYSTECTOMY;  Surgeon: Conard Novak, MD;  Location: [facility] OR[facility];  Service: Gynecology;  Laterality: Left;   OVARIAN CYST REMOVAL     TEMPOROMANDIBULAR JOINT SURGERY      OB History   No obstetric history on file.      Home Medications    Prior to Admission medications   Medication Sig Start Date End Date Taking? Authorizing Provider  alprazolam Prudy Feeler) 2 MG tablet Take 2 mg by mouth 2 (two) times daily.    Yes [provider]  glycopyrrolate (ROBINUL) 1 MG tablet Take 2 mg by mouth 2 (two) times  daily. 02/03/19  Yes [provider]  norethindrone (AYGESTIN) 5 MG tablet Take 1 tablet (5 mg total) by mouth daily for 10 days. 12/13/20 12/23/20  Copland, Ilona Sorrel, PA-C  pantoprazole (PROTONIX) 40 MG tablet Take 1 tablet by mouth 2 (two) times daily. 03/10/21 03/09/22 Yes [provider]  VYVANSE 60 MG capsule Take 60 mg by mouth daily.  07/23/17  Yes [provider]  fluticasone (FLONASE) 50 MCG/ACT nasal spray SPRAY 1 SPRAY INTO EACH NOSTRIL  EVERY DAY 02/03/19   [provider]  gabapentin (NEURONTIN) 300 MG capsule  05/19/19   [provider]  medroxyPROGESTERone (PROVERA) 10 MG tablet Take 1 tablet (10 mg total) by mouth daily for 7 days. 10/04/20 10/11/20  Copland, Ilona Sorrel, PA-C    Family History Family History  Problem Relation Age of Onset   Heart attack Mother    Hypertension Father     Social History Social History   Tobacco Use   Smoking status: Every Day    Packs/day: 0.75    Years: 10.00    Pack years: 7.50    Types: Cigarettes   Smokeless tobacco: Never  Vaping Use   Vaping Use: Never used  Substance Use Topics   Alcohol use: Yes    Alcohol/week: 0.0 standard drinks    Comment: occasionally   Drug use: No     Allergies   Succinylcholine, Succinylcholine chloride, Flagyl [metronidazole], and Tape   Review of Systems Review of Systems  Constitutional:  Negative for fever.  HENT:  Positive for rhinorrhea and sore throat.     Physical Exam Triage Vital Signs ED Triage Vitals  Enc Vitals Group     BP 04/10/21 1326 (!) 133/99     Pulse Rate 04/10/21 1326 81     Resp 04/10/21 1326 18     Temp 04/10/21 1326 98.4 F (36.9 C)     Temp Source 04/10/21 1326 Oral     SpO2 04/10/21 1326 100 %     Weight 04/10/21 1323 119 lb (54 kg)     Height 04/10/21 1323 5\' 2"  (1.575 m)     Head Circumference --      Peak Flow --      Pain Score 04/10/21 1323 0     Pain Loc --      Pain Edu? --      Excl. in GC? --    Updated Vital Signs BP (!) 133/99 (BP Location: Left Arm)   Pulse 81   Temp 98.4 F (36.9 C) (Oral)   Resp 18   Ht 5\' 2"  (1.575 m)   Wt 54 kg   LMP 01/18/2021 (Approximate)   SpO2 100%   BMI 21.77 kg/m   Visual Acuity Right Eye Distance:   Left Eye Distance:   Bilateral Distance:    Right Eye Near:   Left Eye Near:    Bilateral Near:     Physical Exam Vitals and nursing note reviewed.  Constitutional:      General: She is not in acute distress.     Appearance: Normal appearance. She is not ill-appearing.  HENT:     Head: Normocephalic and atraumatic.     Right Ear: Tympanic membrane normal.     Left Ear: Tympanic membrane normal.     Mouth/Throat:     Pharynx: Oropharynx is clear. No oropharyngeal exudate.  Cardiovascular:     Rate and Rhythm: Normal rate and regular rhythm.     Heart sounds: No  murmur heard. Pulmonary:     Effort: Pulmonary effort is normal.     Breath sounds: Normal breath sounds. No wheezing, rhonchi or rales.  Neurological:     Mental Status: She is alert.     UC Treatments / Results  Labs (all labs ordered are listed, but only abnormal results are displayed) Labs Reviewed  SARS CORONAVIRUS 2 (TAT 6-24 HRS)  POC SARS CORONAVIRUS 2 AG -  ED  POC SARS CORONAVIRUS 2 AG    EKG   Radiology No results found.  Procedures Procedures (including critical care time)  Medications Ordered in UC Medications - No data to display  Initial Impression / Assessment and Plan / UC Course  I have reviewed the triage vital signs and the nursing notes.  Pertinent labs & imaging results that were available during my care of the patient were reviewed by me and considered in my medical decision making (see chart for details).    35 year old female presents with viral respiratory infection.  Rapid COVID testing negative.  Awaiting PCR result that was sent out.  Advised rest and fluids.  Tylenol and ibuprofen as needed.  Supportive care.  Final Clinical Impressions(s) / UC Diagnoses   Final diagnoses:  Viral respiratory infection     Discharge Instructions      If COVID testing is positive, we can send in treatment.  Rest. Fluids.  Tylenol and Ibuprofen as needed.    ED Prescriptions   None    PDMP not reviewed this encounter.   Tommie Sams, [place] 04/10/21 1444

## 2021-04-11 LAB — SARS CORONAVIRUS 2 (TAT 6-24 HRS): SARS Coronavirus 2: NEGATIVE

## 2021-04-12 ENCOUNTER — Encounter: Payer: Self-pay | Admitting: Obstetrics and Gynecology

## 2021-06-09 ENCOUNTER — Other Ambulatory Visit: Payer: Self-pay | Admitting: Obstetrics and Gynecology

## 2021-06-09 DIAGNOSIS — N914 Secondary oligomenorrhea: Secondary | ICD-10-CM

## 2021-06-09 MED ORDER — MEDROXYPROGESTERONE ACETATE 10 MG PO TABS: 10.0000 mg | ORAL_TABLET | Freq: Every day | ORAL | 0 refills | Status: DC

## 2021-06-14 ENCOUNTER — Other Ambulatory Visit: Payer: Self-pay

## 2021-06-14 ENCOUNTER — Other Ambulatory Visit (HOSPITAL_COMMUNITY)
Admission: RE | Admit: 2021-06-14 | Discharge: 2021-06-14 | Disposition: A | Discharge status: Home / Self Care | Payer: Medicaid Other | Source: Ambulatory Visit | Attending: Obstetrics and Gynecology | Admitting: Obstetrics and Gynecology

## 2021-06-14 ENCOUNTER — Encounter: Payer: Self-pay | Admitting: Obstetrics and Gynecology

## 2021-06-14 ENCOUNTER — Ambulatory Visit (INDEPENDENT_AMBULATORY_CARE_PROVIDER_SITE_OTHER): Payer: Medicaid Other | Admitting: Obstetrics and Gynecology

## 2021-06-14 VITALS — BP 132/80 | Ht 62.0 in | Wt 116.0 lb

## 2021-06-14 DIAGNOSIS — Z0000 Encounter for general adult medical examination without abnormal findings: Secondary | ICD-10-CM | POA: Diagnosis not present

## 2021-06-14 DIAGNOSIS — N83201 Unspecified ovarian cyst, right side: Secondary | ICD-10-CM

## 2021-06-14 DIAGNOSIS — Z1151 Encounter for screening for human papillomavirus (HPV): Secondary | ICD-10-CM | POA: Insufficient documentation

## 2021-06-14 DIAGNOSIS — Z01419 Encounter for gynecological examination (general) (routine) without abnormal findings: Secondary | ICD-10-CM | POA: Diagnosis not present

## 2021-06-14 DIAGNOSIS — N914 Secondary oligomenorrhea: Secondary | ICD-10-CM

## 2021-06-14 DIAGNOSIS — Z124 Encounter for screening for malignant neoplasm of cervix: Secondary | ICD-10-CM | POA: Insufficient documentation

## 2021-06-14 DIAGNOSIS — N870 Mild cervical dysplasia: Secondary | ICD-10-CM | POA: Insufficient documentation

## 2021-06-14 NOTE — Progress Notes (Signed)
PCP:  [facility]   Chief Complaint  Patient presents with   Gynecologic Exam    A lot of feelings when it's time for her cycle (mood, emotions, nausea, etc.)    HPI:      [name] is a 34 y.o. No obstetric history on file. whose LMP was Patient's last menstrual period was 06/01/2021 (approximate)., presents today for her annual examination.  Her menses have been irregular over the past year with neg labs (used to be monthly until 6/21). Menses now can be every 1-4 months, lasting 5-6 days, mod flow with heavy gushes on heavier days, with dime to quarter sized clots; no BTB, mod dysmen, improved with heating pad, can't take NSAIDs due to GI issues. Has taken provera Q90 days if no periods since pt not interested in [facility] for cycle control. Has had to take aygestin due to heavy flow as well. Did OCPs and depo in past with BTB. Hx of migraines with aura, tob use, borderline HTN at times. Also has mood changes/insomnia, appetitie changes before an during menses  Neg GYN u/s for irregular bleeding except 4.1 cm RTO cyst 3/22; repeat u/s due but not done yet. Hx of ovar cysts in past, has occas RLQ pain, no dyspareunia  Sex activity: single partner, contraception - none. Doesn't think she can get pregnant but doesn't want to prevent it. No pain/bleeding. Last Pap: 04/26/20 at different office; Results were: POS HPV DNA 16; no cytology done at that time but neg cells on 05/04/20 pap.  9/21 colpo with Dr. Jean Rosenthal with CIN 1 on bx; repeat pap due today Hx of STDs: HPV  There is no FH of breast cancer. There is no FH of ovarian cancer; possible FH uterine vs cx vs ovarian in her mom in her 30s. Had to have hyst, no chemo/rad. Mom is deceased. The patient does do self-breast exams.  Tobacco use: < 1 ppd cigs, plans to quit by age 49 Alcohol use: few times wkly No drug use.  Exercise: very active  She does get adequate calcium but not Vitamin D in her diet.  Past Medical History:   Diagnosis Date   Anxiety    Arthritis    hands, legs   Cervical dysplasia 2005   Cervical high risk HPV (human papillomavirus) test positive 2021   Colitis    Depression    Dysrhythmia    PCP wants her to have wear a heart monitor   Family history of adverse reaction to anesthesia    Mother difficult to awaken after Succinycholine, son had PONV   GERD (gastroesophageal reflux disease)    Migraine    Rare.  Tension HA 3x/wk   Migraine with aura    Motion sickness    Ovarian cyst    Scoliosis    TMJ arthritis    TMJ syndrome     Past Surgical History:  Procedure Laterality Date   COLONOSCOPY WITH PROPOFOL N/A 07/03/2016   Procedure: COLONOSCOPY WITH PROPOFOL;  Surgeon: Midge Minium, MD;  Location: [facility];  Service: Endoscopy;  Laterality: N/A;   COLONOSCOPY WITH PROPOFOL N/A 04/04/2018   Procedure: COLONOSCOPY WITH biopsies;  Surgeon: Midge Minium, MD;  Location: [facility];  Service: Endoscopy;  Laterality: N/A;   ESOPHAGOGASTRODUODENOSCOPY N/A 03/06/2016   Procedure: ESOPHAGOGASTRODUODENOSCOPY (EGD);  Surgeon: Scot Jun, MD;  Location: [facility] ENDOSCOPY;  Service: Endoscopy;  Laterality: N/A;   ESOPHAGOGASTRODUODENOSCOPY (EGD) WITH PROPOFOL N/A 04/04/2018   Procedure: ESOPHAGOGASTRODUODENOSCOPY (  EGD) WITH biopsies;  Surgeon: Midge Minium, MD;  Location: [facility];  Service: Endoscopy;  Laterality: N/A;   LAPAROSCOPIC OVARIAN CYSTECTOMY Left 03/07/2016   Procedure: LAPAROSCOPIC OVARIAN CYSTECTOMY;  Surgeon: Conard Novak, MD;  Location: [facility] ORS;  Service: Gynecology;  Laterality: Left;   OVARIAN CYST REMOVAL     TEMPOROMANDIBULAR JOINT SURGERY      Family History  Problem Relation Age of Onset   Heart attack Mother    Cervical cancer Mother        hyst in 70s, no chemo/rad   Hypertension Father    Prostate cancer Maternal Grandfather     Social History   Socioeconomic History   Marital status: Significant Other    Spouse name:  Not on file   Number of children: Not on file   Years of education: Not on file   Highest education level: Not on file  Occupational History   Not on file  Tobacco Use   Smoking status: Every Day    Packs/day: 0.75    Years: 10.00    Pack years: 7.50    Types: Cigarettes   Smokeless tobacco: Never  Vaping Use   Vaping Use: Never used  Substance and Sexual Activity   Alcohol use: Yes    Alcohol/week: 0.0 standard drinks    Comment: occasionally   Drug use: No   Sexual activity: Yes    Birth control/protection: None  Other Topics Concern   Not on file  Social History Narrative   Not on file   Social Determinants of Health   Financial Resource Strain: Not on file  Food Insecurity: Not on file  Transportation Needs: Not on file  Physical Activity: Not on file  Stress: Not on file  Social Connections: Not on file  Intimate Partner Violence: Not on file     Current Outpatient Medications:    alprazolam (XANAX) 2 MG tablet, Take 2 mg by mouth 2 (two) times daily. , Disp: , Rfl:    fluticasone (FLONASE) 50 MCG/ACT nasal spray, SPRAY 1 SPRAY INTO EACH NOSTRIL EVERY DAY, Disp: , Rfl:    glycopyrrolate (ROBINUL) 1 MG tablet, Take 2 mg by mouth 2 (two) times daily., Disp: , Rfl:    hydrOXYzine (ATARAX/VISTARIL) 25 MG tablet, Take by mouth., Disp: , Rfl:    pantoprazole (PROTONIX) 40 MG tablet, Take 1 tablet by mouth 2 (two) times daily., Disp: , Rfl:    VYVANSE 40 MG capsule, Take 40 mg by mouth 2 (two) times daily., Disp: , Rfl:    medroxyPROGESTERone (PROVERA) 10 MG tablet, Take 1 tablet (10 mg total) by mouth daily for 7 days. If no menses Q90 days, Disp: 21 tablet, Rfl: 0   Vilazodone HCl (VIIBRYD) 10 MG TABS, Take 10 mg by mouth daily. (Patient not taking: Reported on 06/14/2021), Disp: , Rfl:     ROS:  Review of Systems  Constitutional:  Positive for fatigue. Negative for fever and unexpected weight change.  Respiratory:  Negative for cough, shortness of breath and  wheezing.   Cardiovascular:  Negative for chest pain, palpitations and leg swelling.  Gastrointestinal:  Negative for blood in stool, constipation, diarrhea, nausea and vomiting.  Endocrine: Negative for cold intolerance, heat intolerance and polyuria.  Genitourinary:  Positive for menstrual problem. Negative for dyspareunia, dysuria, flank pain, frequency, genital sores, hematuria, pelvic pain, urgency, vaginal bleeding, vaginal discharge and vaginal pain.  Musculoskeletal:  Negative for back pain, joint swelling and myalgias.  Skin:  Negative for  rash.  Neurological:  Positive for headaches. Negative for dizziness, syncope, light-headedness and numbness.  Hematological:  Negative for adenopathy.  Psychiatric/Behavioral:  Positive for agitation and dysphoric mood. Negative for confusion, sleep disturbance and suicidal ideas. The patient is not nervous/anxious.   BREAST: No symptoms   Objective: BP 132/80   Ht 5\' 2"  (1.575 m)   Wt 116 lb (52.6 kg)   LMP 06/01/2021 (Approximate)   BMI 21.22 kg/m    Physical Exam Constitutional:      Appearance: She is well-developed.  Genitourinary:     Vulva normal.     Right Labia: No rash, tenderness or lesions.    Left Labia: No tenderness, lesions or rash.    No vaginal discharge, erythema or tenderness.      Right Adnexa: tender.    Right Adnexa: no mass present.    Left Adnexa: not tender and no mass present.    No cervical friability or polyp.     Uterus is not enlarged or tender.  Breasts:    Right: No mass, nipple discharge, skin change or tenderness.     Left: No mass, nipple discharge, skin change or tenderness.  Neck:     Thyroid: No thyromegaly.  Cardiovascular:     Rate and Rhythm: Normal rate and regular rhythm.     Heart sounds: Normal heart sounds. No murmur heard. Pulmonary:     Effort: Pulmonary effort is normal.     Breath sounds: Normal breath sounds.  Abdominal:     Palpations: Abdomen is soft.     Tenderness:  There is no abdominal tenderness. There is no guarding or rebound.  Musculoskeletal:        General: Normal range of motion.     Cervical back: Normal range of motion.  Lymphadenopathy:     Cervical: No cervical adenopathy.  Neurological:     General: No focal deficit present.     Mental Status: She is alert and oriented to person, place, and time.     Cranial Nerves: No cranial nerve deficit.  Skin:    General: Skin is warm and dry.  Psychiatric:        Mood and Affect: Mood normal.        Behavior: Behavior normal.        Thought Content: Thought content normal.        Judgment: Judgment normal.  Vitals reviewed.    Assessment/Plan: Encounter for annual routine gynecological examination  Cervical cancer screening - Plan: Cytology - PAP  Screening for HPV (human papillomavirus) - Plan: Cytology - PAP  Dysplasia of cervix, low grade (CIN 1) - Plan: Cytology - PAP; repeat pap today. Will f/u with results.   Secondary oligomenorrhea--neg labs, no reason for sx. Pt aware of prog options for [facility] and protection of EM, but prog only options won't give monthly cycles. Pt unsure if she wants to prevent pregnancy with BC. Will cont to do provera prn no menses Q90 days. Rx RF. F/u prn.   Cyst of right ovary - Plan: [facility] PELVIS TRANSVAGINAL NON-OB (TV ONLY); recheck GYN u/s. Will f/u with results.   Meds ordered this encounter  Medications   medroxyPROGESTERone (PROVERA) 10 MG tablet    Sig: Take 1 tablet (10 mg total) by mouth daily for 7 days. If no menses Q90 days    Dispense:  21 tablet    Refill:  0    Order Specific Question:   Supervising Provider    Answer:  Nadara Mustard [[id number]]              GYN counsel family planning choices, adequate intake of calcium and vitamin D, diet and exercise     F/U  Return in about 1 week (around 06/21/2021) for GYN u/s at [facility] for RTO cyst f/u; ABC to call pt.  Jovita Persing B. Torian Quintero, PA-C 06/15/2021 3:46 PM

## 2021-06-14 NOTE — Patient Instructions (Signed)
I value your feedback and you entrusting us with your care. If you get a [facility] patient survey, I would appreciate you taking the time to let us know about your experience today. Thank you! ? ? ?

## 2021-06-15 MED ORDER — MEDROXYPROGESTERONE ACETATE 10 MG PO TABS
10.0000 mg | ORAL_TABLET | Freq: Every day | ORAL | 0 refills | Status: DC
Start: 2021-06-15 — End: 2022-07-11

## 2021-06-16 ENCOUNTER — Encounter: Payer: Self-pay | Admitting: Obstetrics and Gynecology

## 2021-06-16 LAB — CYTOLOGY - PAP
Comment: NEGATIVE
Diagnosis: NEGATIVE
High risk HPV: NEGATIVE

## 2021-06-24 ENCOUNTER — Other Ambulatory Visit: Payer: Medicaid Other

## 2021-06-29 NOTE — Addendum Note (Signed)
Addended by: Althea Grimmer B on: 06/29/2021 02:19 PM   Modules accepted: Orders

## 2021-07-05 ENCOUNTER — Ambulatory Visit
Admission: RE | Admit: 2021-07-05 | Discharge: 2021-07-05 | Disposition: A | Discharge status: Home / Self Care | Payer: Medicaid Other | Source: Ambulatory Visit | Attending: Obstetrics and Gynecology | Admitting: Obstetrics and Gynecology

## 2021-07-05 ENCOUNTER — Other Ambulatory Visit: Payer: Self-pay

## 2021-07-05 DIAGNOSIS — N83201 Unspecified ovarian cyst, right side: Secondary | ICD-10-CM | POA: Insufficient documentation

## 2021-07-06 ENCOUNTER — Ambulatory Visit: Payer: Medicaid Other

## 2021-07-06 DIAGNOSIS — N83201 Unspecified ovarian cyst, right side: Secondary | ICD-10-CM

## 2021-07-18 ENCOUNTER — Ambulatory Visit: Payer: Medicaid Other | Admitting: Obstetrics and Gynecology

## 2021-08-22 IMAGING — US US PELVIS COMPLETE TRANSABD/TRANSVAG W DUPLEX
1 series · 13 of 25 positions shown · non-contrast
Comparison: Pelvic ultrasound 04/16/2017

CLINICAL DATA: Heavy vaginal bleeding for 3 days.



[Series 1: us pelvic complete with transvaginal · 13 of 111 slices shown]
[im 1/111]
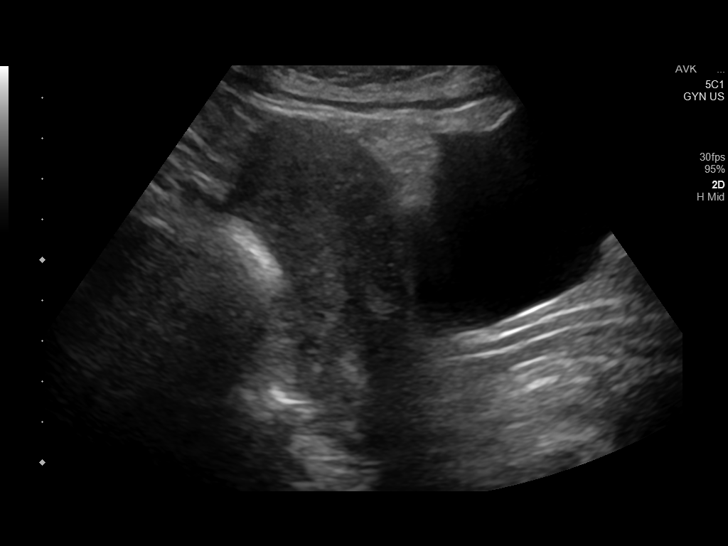
[im 10/111]
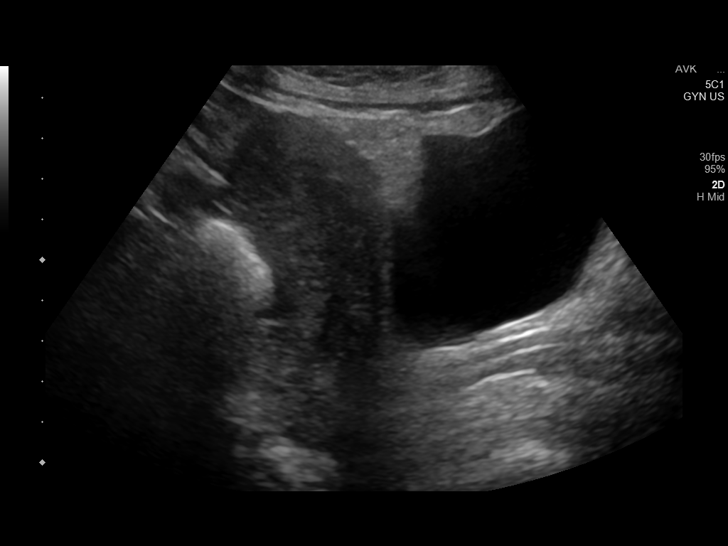
[im 19/111]
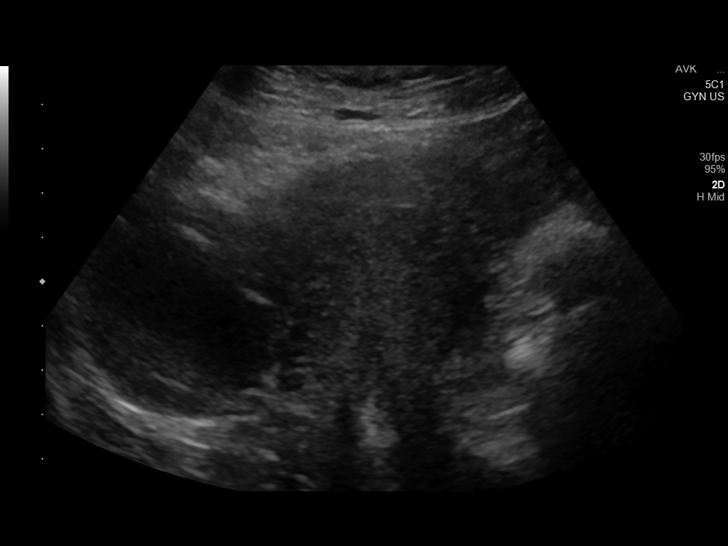
[im 28/111]
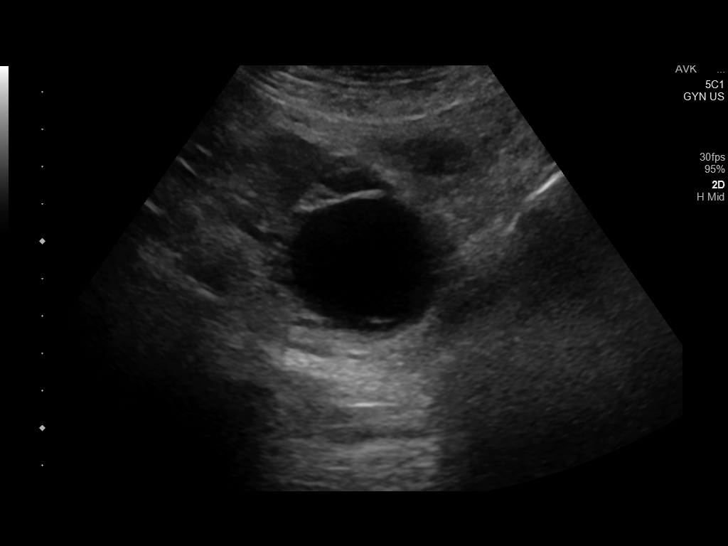
[im 37/111]
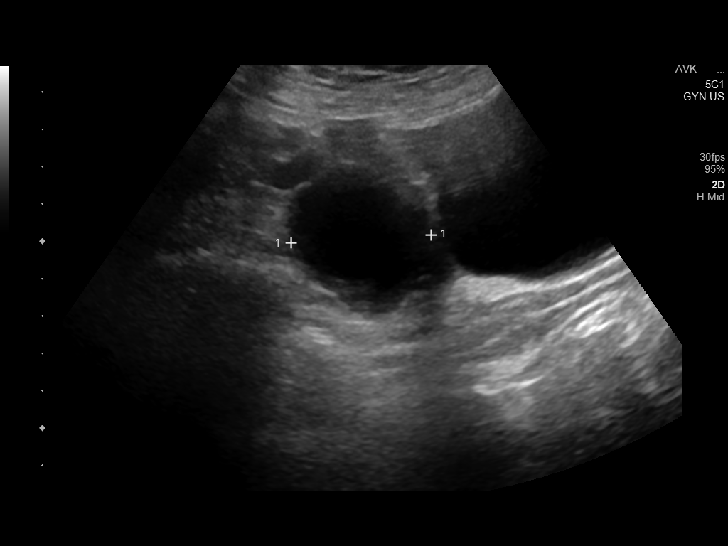
[im 46/111]
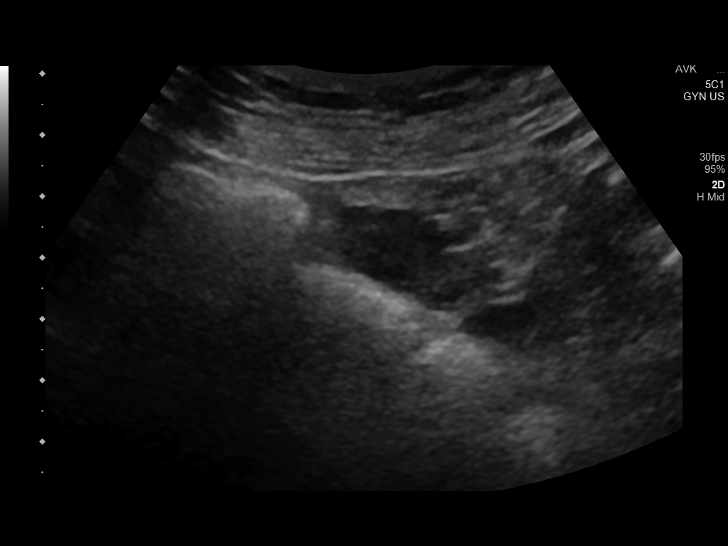
[im 56/111]
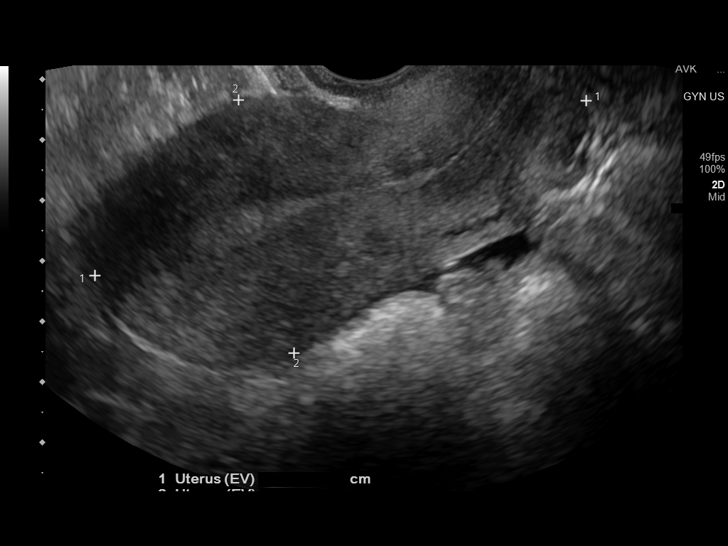
[im 65/111]
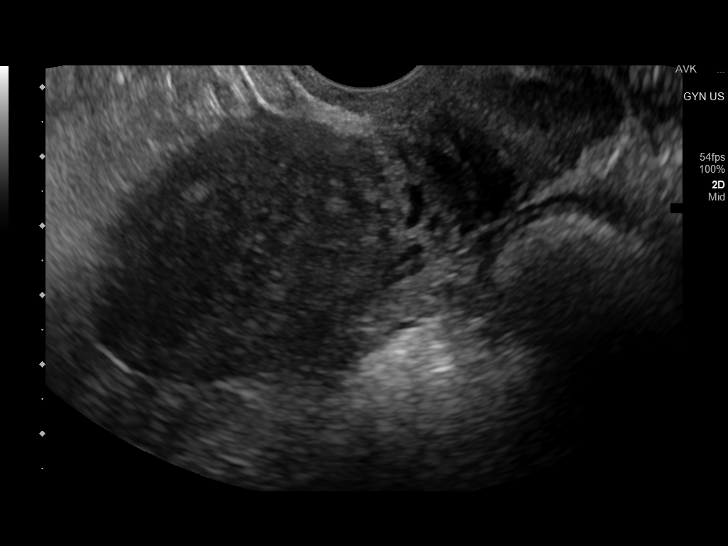
[im 74/111]
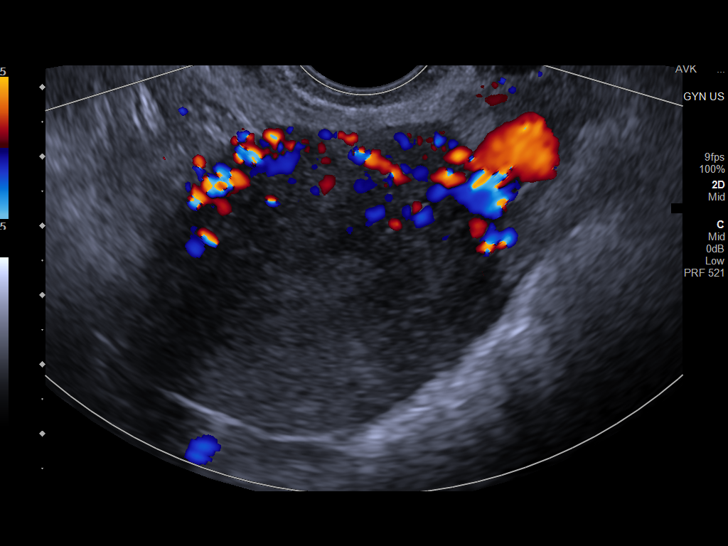
[im 83/111]
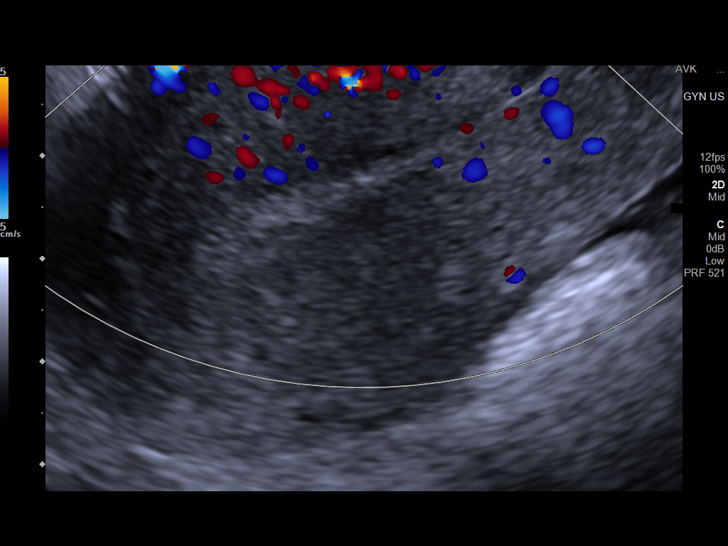
[im 92/111]
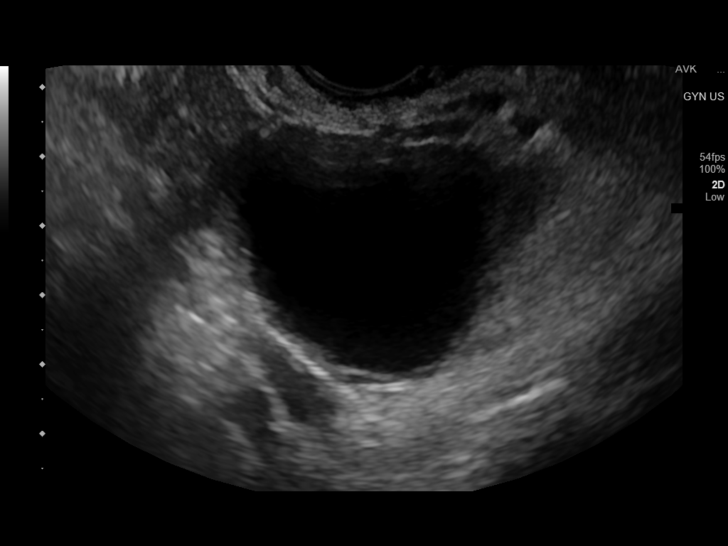
[im 101/111]
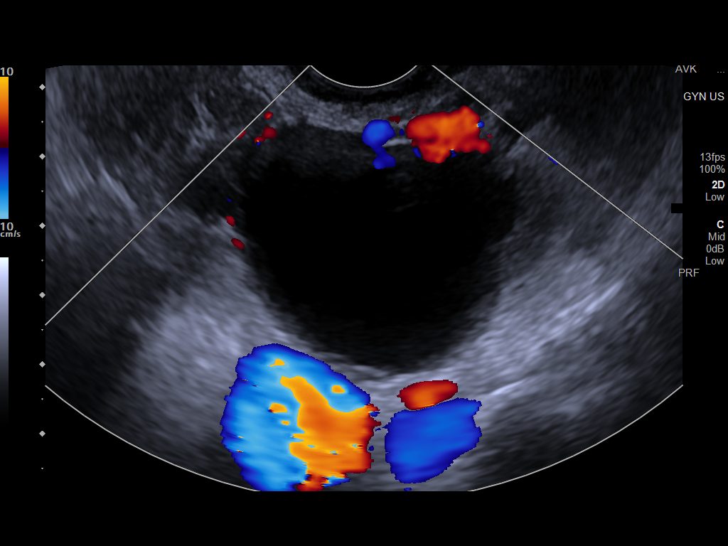
[im 111/111]
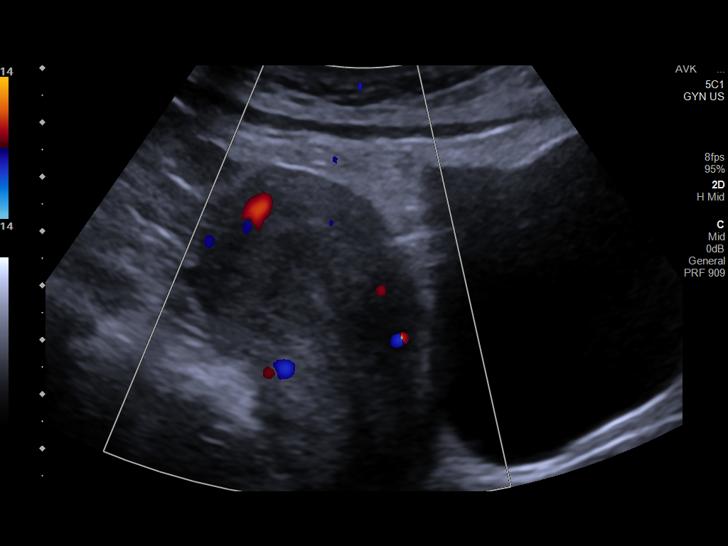

[13 of 25 positions shown; findings below may reference images not displayed]

FINDINGS: Uterus

Measurements: 8.6 x 4.3 x 5.4 cm = volume: 105 mL. No fibroids or
other mass visualized.

Endometrium

Thickness: 0.7 cm.  No focal abnormality visualized.

Right ovary

Measurements: 4.1 x 3.9 x 3.8 cm = volume: 32 mL. There is a simple
cyst in the right ovary measuring 4.1 x 3.8 x 3.6 cm.

Left ovary

Measurements: 2.2 x 2.4 x 1.3 = volume: 3.5 mL. Normal appearance/no
adnexal mass.

Pulsed Doppler evaluation of both ovaries demonstrates normal
low-resistance arterial and venous waveforms.

Other findings

No abnormal free fluid.
IMPRESSION: 1. No sonographic finding to explain the patient's vaginal bleeding.

2. There is a 4.1 cm simple cyst in the right ovary. Recommend
follow-up ultrasound in 6-8 weeks to ensure resolution.

## 2021-11-23 ENCOUNTER — Ambulatory Visit: Admit: 2021-11-23 | Payer: Medicaid Other

## 2021-11-24 ENCOUNTER — Ambulatory Visit
Admission: RE | Admit: 2021-11-24 | Discharge: 2021-11-24 | Disposition: A | Discharge status: Home / Self Care | Payer: Medicaid Other | Source: Ambulatory Visit | Attending: Emergency Medicine | Admitting: Emergency Medicine

## 2021-11-24 ENCOUNTER — Other Ambulatory Visit: Payer: Self-pay

## 2021-11-24 VITALS — BP 145/108 | HR 100 | Temp 98.1°F | Resp 18 | Ht 62.0 in | Wt 115.0 lb

## 2021-11-24 DIAGNOSIS — R051 Acute cough: Secondary | ICD-10-CM | POA: Diagnosis not present

## 2021-11-24 DIAGNOSIS — J069 Acute upper respiratory infection, unspecified: Secondary | ICD-10-CM | POA: Diagnosis not present

## 2021-11-24 MED ORDER — IPRATROPIUM BROMIDE 0.06 % NA SOLN: 2.0000 | Freq: Four times a day (QID) | NASAL | 12 refills | Status: DC

## 2021-11-24 MED ORDER — AMOXICILLIN-POT CLAVULANATE 875-125 MG PO TABS: 1.0000 | ORAL_TABLET | Freq: Two times a day (BID) | ORAL | 0 refills | Status: AC

## 2021-11-24 MED ORDER — BENZONATATE 100 MG PO CAPS: 200.0000 mg | ORAL_CAPSULE | Freq: Three times a day (TID) | ORAL | 0 refills | Status: DC

## 2021-11-24 MED ORDER — PROMETHAZINE-DM 6.25-15 MG/5ML PO SYRP: 5.0000 mL | ORAL_SOLUTION | Freq: Four times a day (QID) | ORAL | 0 refills | Status: DC | PRN

## 2021-11-24 NOTE — ED Provider Notes (Signed)
[facility]    CSN: [id number] Arrival date & time: 11/24/21  1105      History   Chief Complaint Chief Complaint  Patient presents with   Nasal Congestion   Cough   Sore Throat    HPI Vicki Dominguez is a 36 y.o. female.   HPI  38 old female here for evaluation of respiratory complaints.  Patient reports that she has been experiencing nasal congestion, sore throat, fevers that are mostly at night, green nasal discharge, a cough that is intermittently productive for yellow sputum, wheezing, mostly at night when she is laying down, and headache.  She denies ear pain or shortness of breath.  Her husband had similar symptoms that started before hers and he was tested negative for COVID and flu by his PCP.  He has since gotten better but she has continued to have lingering symptoms that are only modestly improved.  Past Medical History:  Diagnosis Date   Anxiety    Arthritis    hands, legs   Cervical dysplasia 2005   Cervical high risk HPV (human papillomavirus) test positive 2021   Colitis    Depression    Dysrhythmia    PCP wants her to have wear a heart monitor   Family history of adverse reaction to anesthesia    Mother difficult to awaken after Succinycholine, son had PONV   GERD (gastroesophageal reflux disease)    Migraine    Rare.  Tension HA 3x/wk   Migraine with aura    Motion sickness    Ovarian cyst    Scoliosis    TMJ arthritis    TMJ syndrome     Patient Active Problem List   Diagnosis Date Noted   Dysplasia of cervix, low grade (CIN 1) 06/14/2021   Secondary oligomenorrhea 06/14/2021   Cervical high risk human papillomavirus (HPV) DNA test positive 05/04/2020   False positive ana 11/11/2018   Polyarthralgia 08/28/2018   Positive ANA (antinuclear antibody) 08/28/2018   Screening for osteoporosis 08/28/2018   Back pain 06/24/2018   Diarrhea    Scoliosis 03/22/2018   Tachycardia 03/12/2018   Rash 02/11/2018   Chest wall pain  12/03/2017   Opioid use disorder, severe, dependence (HCC) 01/24/2017   Tobacco use disorder 01/24/2017   Cannabis use disorder, moderate, dependence (HCC) 01/24/2017   Severe recurrent major depression without psychotic features (HCC) 01/23/2017   GERD (gastroesophageal reflux disease) 02/28/2016   Depression 05/20/2012   TMJ (temporomandibular joint syndrome) 11/23/2009   History of cardiac arrhythmia 11/24/2003    Past Surgical History:  Procedure Laterality Date   COLONOSCOPY WITH PROPOFOL N/A 07/03/2016   Procedure: COLONOSCOPY WITH PROPOFOL;  Surgeon: Midge Minium, MD;  Location: [facility];  Service: Endoscopy;  Laterality: N/A;   COLONOSCOPY WITH PROPOFOL N/A 04/04/2018   Procedure: COLONOSCOPY WITH biopsies;  Surgeon: Midge Minium, MD;  Location: [facility];  Service: Endoscopy;  Laterality: N/A;   ESOPHAGOGASTRODUODENOSCOPY N/A 03/06/2016   Procedure: ESOPHAGOGASTRODUODENOSCOPY (EGD);  Surgeon: Scot Jun, MD;  Location: [facility] ENDOSCOPY;  Service: Endoscopy;  Laterality: N/A;   ESOPHAGOGASTRODUODENOSCOPY (EGD) WITH PROPOFOL N/A 04/04/2018   Procedure: ESOPHAGOGASTRODUODENOSCOPY (EGD) WITH biopsies;  Surgeon: Midge Minium, MD;  Location: [facility];  Service: Endoscopy;  Laterality: N/A;   LAPAROSCOPIC OVARIAN CYSTECTOMY Left 03/07/2016   Procedure: LAPAROSCOPIC OVARIAN CYSTECTOMY;  Surgeon: Conard Novak, MD;  Location: [facility] ORS;  Service: Gynecology;  Laterality: Left;   OVARIAN CYST REMOVAL     TEMPOROMANDIBULAR JOINT  SURGERY      OB History     Gravida  2   Para  2   Term      Preterm      AB      Living  2      SAB      IAB      Ectopic      Multiple      Live Births  2            Home Medications    Prior to Admission medications   Medication Sig Start Date End Date Taking? Authorizing Provider  amoxicillin-clavulanate (AUGMENTIN) 875-125 MG tablet Take 1 tablet by mouth every 12 (twelve) hours for 10 days.  11/24/21 12/04/21 Yes Becky Augusta, NP  benzonatate (TESSALON) 100 MG capsule Take 2 capsules (200 mg total) by mouth every 8 (eight) hours. 11/24/21  Yes Becky Augusta, NP  ipratropium (ATROVENT) 0.06 % nasal spray Place 2 sprays into both nostrils 4 (four) times daily. 11/24/21  Yes Becky Augusta, NP  promethazine-dextromethorphan (PROMETHAZINE-DM) 6.25-15 MG/5ML syrup Take 5 mLs by mouth 4 (four) times daily as needed. 11/24/21  Yes Becky Augusta, NP  alprazolam Prudy Feeler) 2 MG tablet Take 2 mg by mouth 2 (two) times daily.     [provider]  fluticasone (FLONASE) 50 MCG/ACT nasal spray SPRAY 1 SPRAY INTO EACH NOSTRIL EVERY DAY 02/03/19   [provider]  glycopyrrolate (ROBINUL) 1 MG tablet Take 2 mg by mouth 2 (two) times daily. 02/03/19   [provider]  hydrOXYzine (ATARAX/VISTARIL) 25 MG tablet Take by mouth. 03/05/20   [provider]  medroxyPROGESTERone (PROVERA) 10 MG tablet Take 1 tablet (10 mg total) by mouth daily for 7 days. If no menses Q90 days 06/15/21 06/22/21  Copland, Helmut Muster B, PA-C  pantoprazole (PROTONIX) 40 MG tablet Take 1 tablet by mouth 2 (two) times daily. 03/10/21 03/09/22  [provider]  Vilazodone HCl (VIIBRYD) 10 MG TABS Take 10 mg by mouth daily. Patient not taking: Reported on 06/14/2021 06/07/21   [provider]  VYVANSE 40 MG capsule Take 40 mg by mouth 2 (two) times daily. 06/07/21   [provider]    Family History Family History  Problem Relation Age of Onset   Heart attack Mother    Cervical cancer Mother        hyst in 30s, no chemo/rad   Hypertension Father    Prostate cancer Maternal Grandfather     Social History Social History   Tobacco Use   Smoking status: Every Day    Packs/day: 0.75    Years: 10.00    Pack years: 7.50    Types: Cigarettes   Smokeless tobacco: Never  Vaping Use   Vaping Use: Never used  Substance Use Topics   Alcohol use: Yes    Alcohol/week: 0.0 standard drinks     Comment: occasionally   Drug use: No     Allergies   Succinylcholine, Succinylcholine chloride, Flagyl [metronidazole], and Tape   Review of Systems Review of Systems  Constitutional:  Positive for fever.  HENT:  Positive for congestion, rhinorrhea and sore throat. Negative for ear pain.   Respiratory:  Positive for cough and wheezing. Negative for shortness of breath.   Neurological:  Positive for headaches.  Hematological: Negative.   Psychiatric/Behavioral: Negative.      Physical Exam Triage Vital Signs ED Triage Vitals  Enc Vitals Group     BP 11/24/21 1117 (!) 145/108  Pulse Rate 11/24/21 1117 100     Resp 11/24/21 1117 18     Temp 11/24/21 1117 98.1 F (36.7 C)     Temp Source 11/24/21 1117 Oral     SpO2 11/24/21 1117 100 %     Weight 11/24/21 1114 115 lb (52.2 kg)     Height 11/24/21 1114 5\' 2"  (1.575 m)     Head Circumference --      Peak Flow --      Pain Score 11/24/21 1114 5     Pain Loc --      Pain Edu? --      Excl. in GC? --    No data found.  Updated Vital Signs BP (!) 145/108 (BP Location: Left Arm)    Pulse 100    Temp 98.1 F (36.7 C) (Oral)    Resp 18    Ht 5\' 2"  (1.575 m)    Wt 115 lb (52.2 kg)    LMP 10/27/2021 (Approximate)    SpO2 100%    BMI 21.03 kg/m   Visual Acuity Right Eye Distance:   Left Eye Distance:   Bilateral Distance:    Right Eye Near:   Left Eye Near:    Bilateral Near:     Physical Exam Vitals and nursing note reviewed.  Constitutional:      Appearance: Normal appearance. She is not ill-appearing.  HENT:     Head: Normocephalic and atraumatic.     Right Ear: Tympanic membrane, ear canal and external ear normal. There is no impacted cerumen.     Left Ear: Tympanic membrane, ear canal and external ear normal. There is no impacted cerumen.     Nose: Congestion and rhinorrhea present.     Mouth/Throat:     Mouth: Mucous membranes are moist.     Pharynx: Oropharynx is clear. Posterior oropharyngeal erythema  present.  Cardiovascular:     Rate and Rhythm: Normal rate and regular rhythm.     Pulses: Normal pulses.     Heart sounds: Normal heart sounds. No murmur heard.   No friction rub. No gallop.  Pulmonary:     Effort: Pulmonary effort is normal.     Breath sounds: Normal breath sounds. No wheezing, rhonchi or rales.  Musculoskeletal:     Cervical back: Normal range of motion and neck supple.  Lymphadenopathy:     Cervical: Cervical adenopathy present.  Skin:    General: Skin is warm and dry.     Capillary Refill: Capillary refill takes less than 2 seconds.     Findings: No erythema or rash.  Neurological:     General: No focal deficit present.     Mental Status: She is alert and oriented to person, place, and time.  Psychiatric:        Mood and Affect: Mood normal.        Behavior: Behavior normal.        Thought Content: Thought content normal.        Judgment: Judgment normal.     UC Treatments / Results  Labs (all labs ordered are listed, but only abnormal results are displayed) Labs Reviewed - No data to display  EKG   Radiology No results found.  Procedures Procedures (including critical care time)  Medications Ordered in UC Medications - No data to display  Initial Impression / Assessment and Plan / UC Course  I have reviewed the triage vital signs and the nursing notes.  Pertinent labs & imaging  results that were available during my care of the patient were reviewed by me and considered in my medical decision making (see chart for details).  Is a pleasant, nontoxic-appearing [age] old female here for evaluation of respiratory complaints as outlined HPI above.  Her physical exam reveals pearly-gray tympanic membranes bilaterally with normal light reflex and clear external auditory canals.  Nasal mucosa is erythematous and edematous with yellow discharge in both nares.  Oropharyngeal exam reveals posterior oropharyngeal erythema with yellow postnasal drip.  Patient  does have bilateral anterior cervical lymphadenopathy that are tender to palpation.  Cardiopulmonary exam feels clung sounds in all fields.  Given patient's duration of symptoms, continued fevers at night, and general lack of improvement I feel a trial of antibiotics is warranted.  I will place her on Augmentin twice daily for 10 days.  In addition, I will give her Atrovent nasal spray to help with the congestion, Tessalon Perles help with cough in the day, and Promethazine DM to help with cough at nighttime.  Patient denies a need for work note.  Return precautions reviewed.   Final Clinical Impressions(s) / UC Diagnoses   Final diagnoses:  Upper respiratory tract infection, unspecified type  Acute cough     Discharge Instructions      Take the Augmentin twice daily with food for 10 days for treatment of your URI.   Use the Atrovent nasal spray, 2 squirts in each nostril every 6 hours, as needed for runny nose and postnasal drip.  Use the Tessalon Perles every 8 hours during the day.  Take them with a small sip of water.  They may give you some numbness to the base of your tongue or a metallic taste in your mouth, this is normal.  Use the Promethazine DM cough syrup at bedtime for cough and congestion.  It will make you drowsy so do not take it during the day.  Return for reevaluation or see your primary care provider for any new or worsening symptoms.      ED Prescriptions     Medication Sig Dispense Auth. Provider   amoxicillin-clavulanate (AUGMENTIN) 875-125 MG tablet Take 1 tablet by mouth every 12 (twelve) hours for 10 days. 20 tablet Becky Augustayan, Ercia Crisafulli, NP   benzonatate (TESSALON) 100 MG capsule Take 2 capsules (200 mg total) by mouth every 8 (eight) hours. 21 capsule Becky Augustayan, Cage Gupton, NP   ipratropium (ATROVENT) 0.06 % nasal spray Place 2 sprays into both nostrils 4 (four) times daily. 15 mL Becky Augustayan, Draden Cottingham, NP   promethazine-dextromethorphan (PROMETHAZINE-DM) 6.25-15 MG/5ML syrup Take 5  mLs by mouth 4 (four) times daily as needed. 118 mL Becky Augustayan, Dashanna Kinnamon, NP      PDMP not reviewed this encounter.   Becky Augustayan, Unnamed Hino, NP 11/24/21 1212

## 2021-11-24 NOTE — ED Triage Notes (Signed)
Pt reports sore throat, nasal congestion and cough starting last Thursday. Pt states the dry cough and sore throat comes and goes.  ?

## 2021-11-24 NOTE — Discharge Instructions (Addendum)
Take the Augmentin twice daily with food for 10 days for treatment of your URI.  ? ?Use the Atrovent nasal spray, 2 squirts in each nostril every 6 hours, as needed for runny nose and postnasal drip. ? ?Use the Tessalon Perles every 8 hours during the day.  Take them with a small sip of water.  They may give you some numbness to the base of your tongue or a metallic taste in your mouth, this is normal. ? ?Use the Promethazine DM cough syrup at bedtime for cough and congestion.  It will make you drowsy so do not take it during the day. ? ?Return for reevaluation or see your primary care provider for any new or worsening symptoms.  ?

## 2022-03-03 ENCOUNTER — Encounter: Payer: Self-pay | Admitting: Obstetrics and Gynecology

## 2022-03-15 IMAGING — US US PELVIS COMPLETE WITH TRANSVAGINAL
1 series · 13 of 25 positions shown · non-contrast
Comparison: 12/12/2020

CLINICAL DATA: RIGHT ovarian cyst, follow-up, irregular menses;
prior LEFT ovarian cystectomy 5284. LMP 07/03/2021

EXAM:
TRANSABDOMINAL AND TRANSVAGINAL ULTRASOUND OF PELVIS
TECHNIQUE: Both transabdominal and transvaginal ultrasound examinations of the
pelvis were performed. Transabdominal technique was performed for
global imaging of the pelvis including uterus, ovaries, adnexal
regions, and pelvic cul-de-sac. It was necessary to proceed with
endovaginal exam following the transabdominal exam to visualize the
endometrium, and to characterize a RIGHT ovarian lesion

[Series 1: us pelvis complete with transvaginal · 0.20mm/px · 13 of 65 slices shown]
[im 1/65]
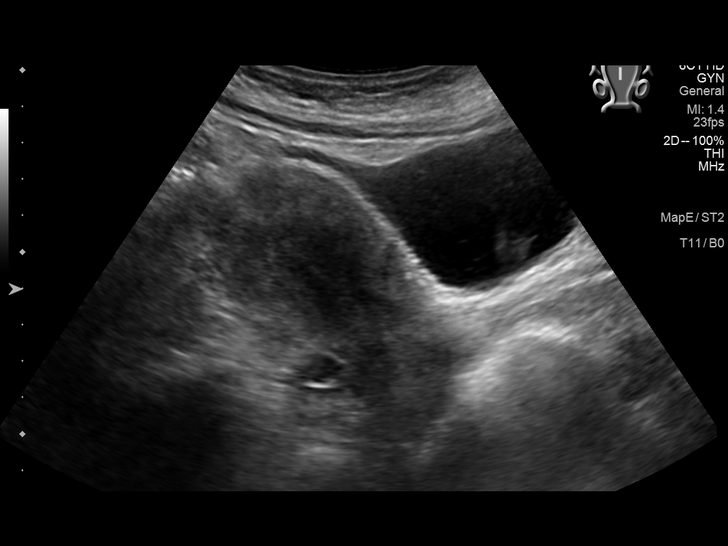
[im 6/65]
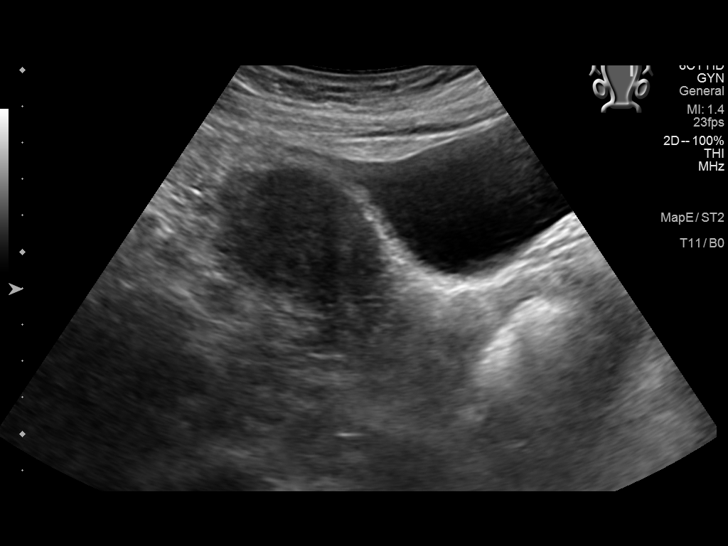
[im 11/65]
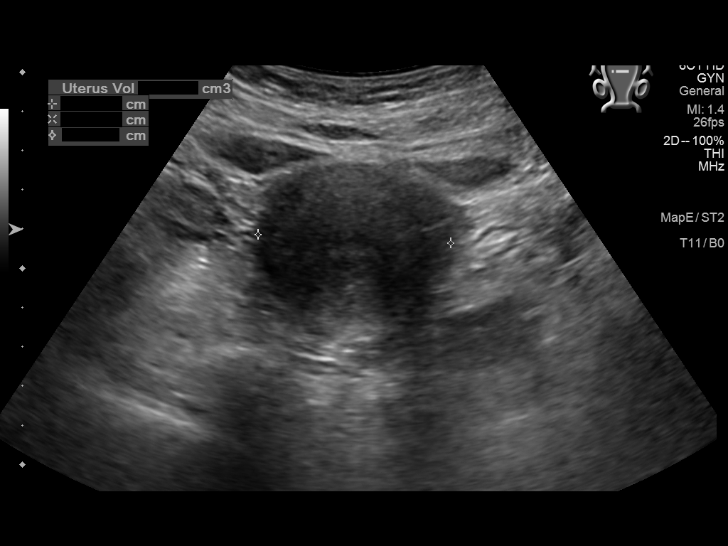
[im 17/65]
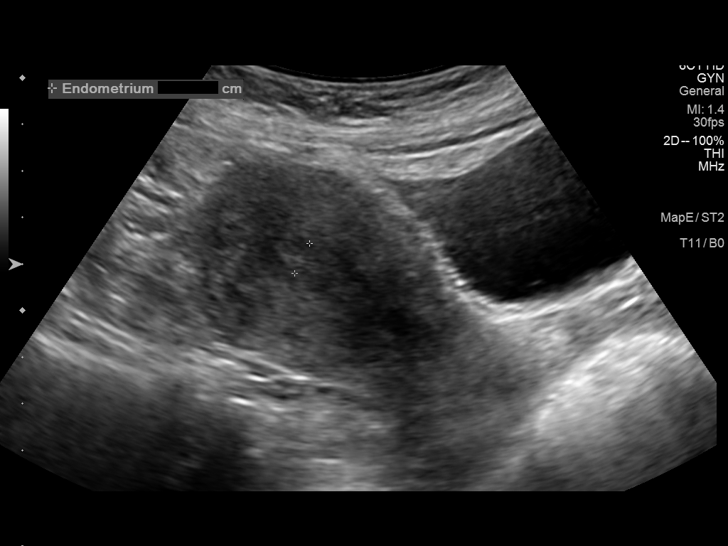
[im 22/65]
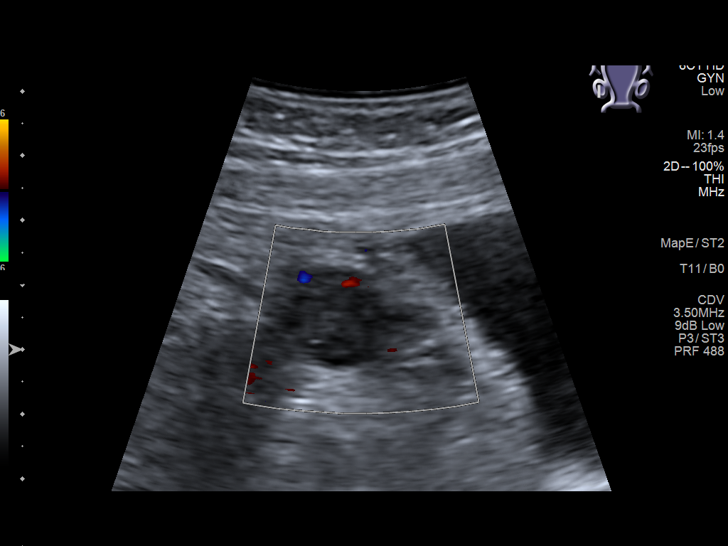
[im 27/65]
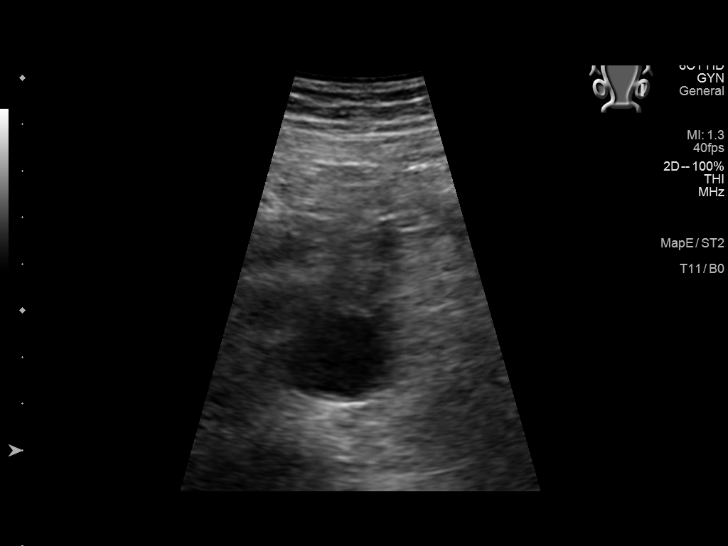
[im 33/65]
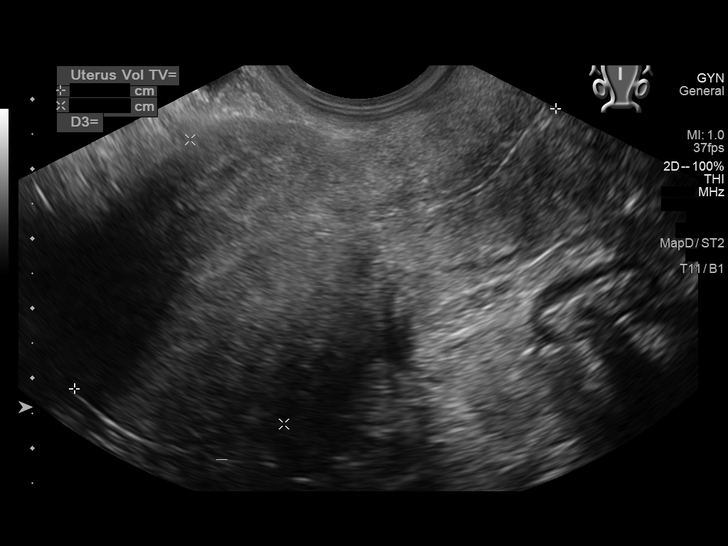
[im 38/65]
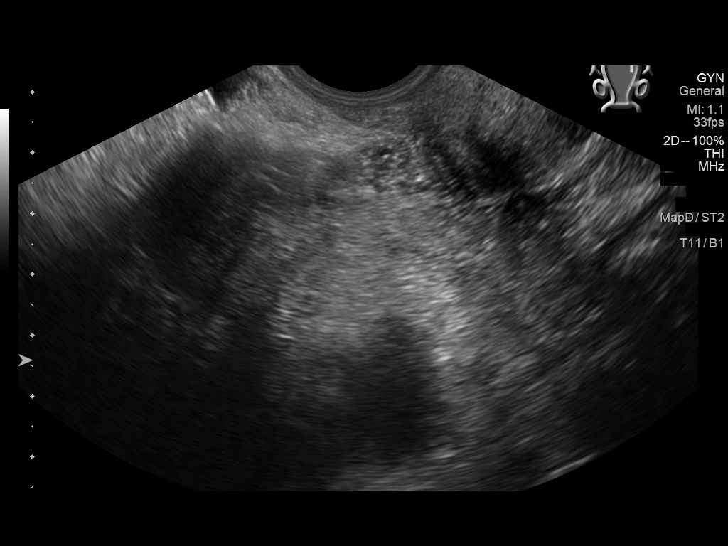
[im 43/65]
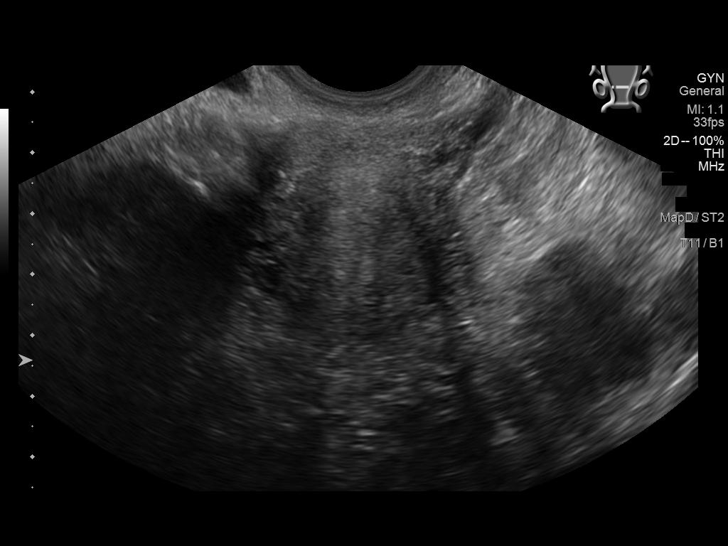
[im 49/65]
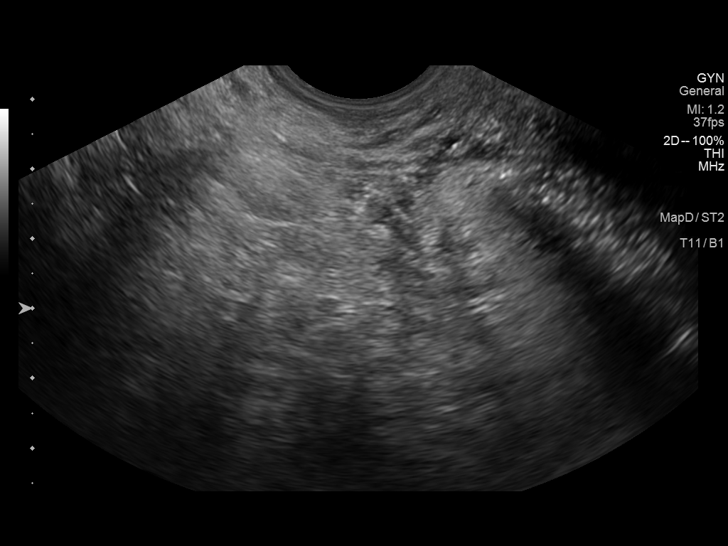
[im 54/65]
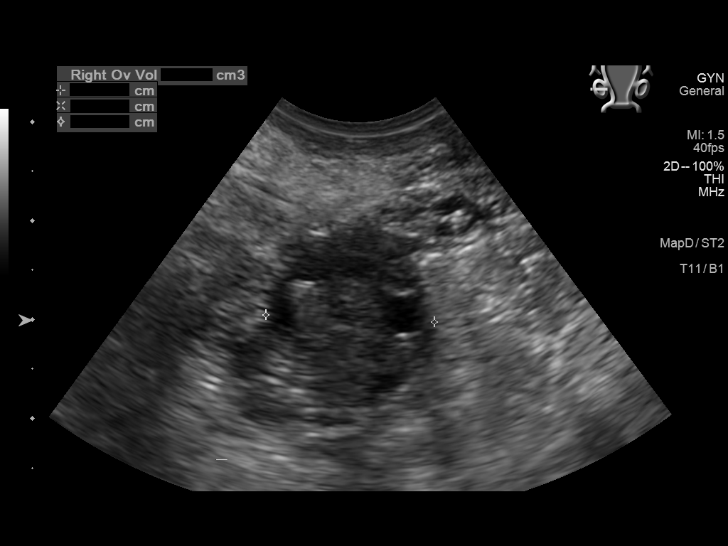
[im 59/65]
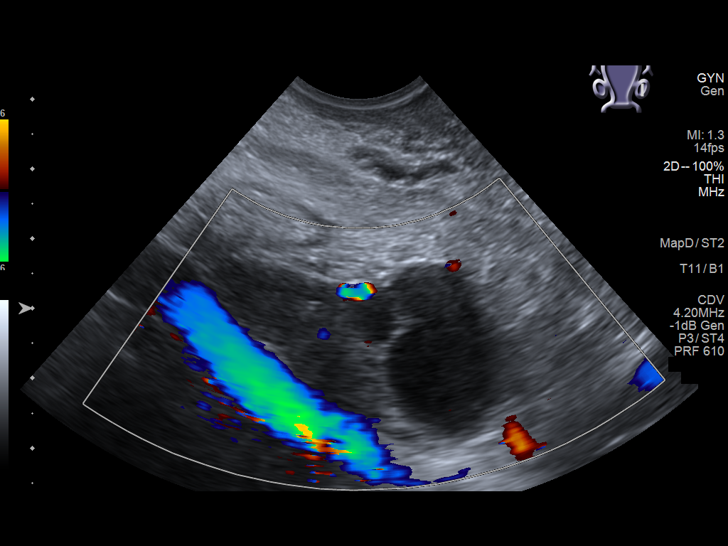
[im 65/65]
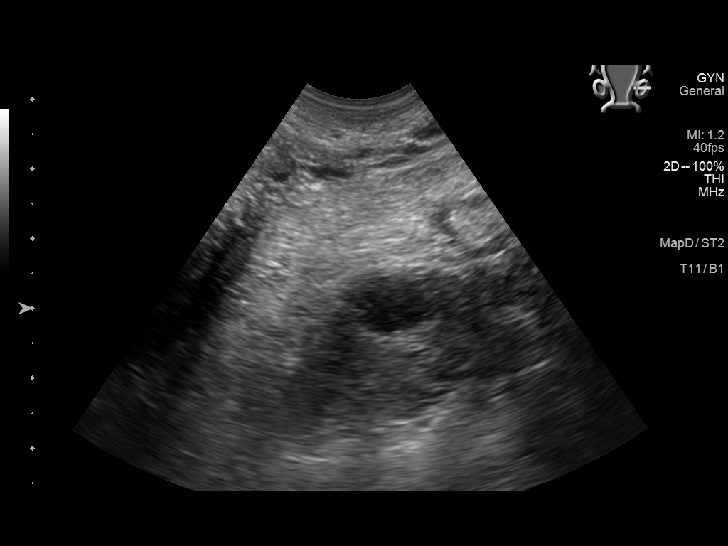

[13 of 25 positions shown; findings below may reference images not displayed]

FINDINGS: Uterus

Measurements: 8.0 x 4.2 x 4.9 cm = volume: 85 mL. Anteverted. Normal
morphology without mass

Endometrium

Thickness: 8 mm.  No endometrial fluid or focal abnormality

Right ovary

Measurements: 3.2 x 1.6 x 1.7 cm = volume: 4.6 mL. Normal morphology
without mass. Interval resolution of previously identified RIGHT
ovarian cyst.

Left ovary

Measurements: 4.3 x 2.4 x 2.3 cm = volume: 12.3 mL. Normal
morphology without mass

Other findings

No free pelvic fluid.  No adnexal masses.
IMPRESSION: Normal exam.

Interval resolution of previously identified RIGHT ovarian cyst.

## 2022-06-14 NOTE — Progress Notes (Deleted)
PCP:  [facility]   No chief complaint on file.   HPI:      [name] is a 36 y.o. No obstetric history on file. whose LMP was No LMP recorded. (Menstrual status: Irregular Periods)., presents today for her annual examination.  Her menses have been irregular over the past 2 years with neg labs (used to be monthly until 6/21). Menses now can be every 1-4 months, lasting 5-6 days, mod flow with heavy gushes on heavier days, with dime to quarter sized clots; no BTB, mod dysmen, improved with heating pad, can't take NSAIDs due to GI issues. Has taken provera Q90 days if no periods since pt not interested in [facility] for cycle control. Has had to take aygestin due to heavy flow as well. Did OCPs and depo in past with BTB. Hx of migraines with aura, tob use, borderline HTN at times. Also has mood changes/insomnia, appetitie changes before and during menses  Neg GYN u/s for irregular bleeding except 4.1 cm RTO cyst 3/22; resolved on 10/22 u/s Hx of ovar cysts in past, has occas RLQ pain, no dyspareunia  Sex activity: single partner, contraception - none. Doesn't think she can get pregnant but doesn't want to prevent it. No pain/bleeding. Last Pap: 06/14/21  Results were no abnormalities/neg HPV DNA. 04/26/20 at different office; Results were: POS HPV DNA 16; no cytology done at that time but neg cells on 05/04/20 pap.  9/21 colpo with Dr. Glennon Mac with CIN 1 on bx; repeat pap due today Hx of STDs: HPV  There is no FH of breast cancer. There is no FH of ovarian cancer; possible FH uterine vs cx vs ovarian in her mom in her 25s. Had to have hyst, no chemo/rad. Mom is deceased. The patient does do self-breast exams.  Tobacco use: < 1 ppd cigs, plans to quit by age 36 Alcohol use: few times wkly No drug use.  Exercise: very active  She does get adequate calcium but not Vitamin D in her diet.  Past Medical History:  Diagnosis Date   Anxiety    Arthritis    hands, legs   Cervical  dysplasia 2005   Cervical high risk HPV (human papillomavirus) test positive 2021   Colitis    Depression    Dysrhythmia    PCP wants her to have wear a heart monitor   Family history of adverse reaction to anesthesia    Mother difficult to awaken after Succinycholine, son had PONV   GERD (gastroesophageal reflux disease)    Migraine    Rare.  Tension HA 3x/wk   Migraine with aura    Motion sickness    Ovarian cyst    Scoliosis    TMJ arthritis    TMJ syndrome     Past Surgical History:  Procedure Laterality Date   COLONOSCOPY WITH PROPOFOL N/A 07/03/2016   Procedure: COLONOSCOPY WITH PROPOFOL;  Surgeon: Lucilla Lame, MD;  Location: [place];  Service: Endoscopy;  Laterality: N/A;   COLONOSCOPY WITH PROPOFOL N/A 04/04/2018   Procedure: COLONOSCOPY WITH biopsies;  Surgeon: Lucilla Lame, MD;  Location: [place];  Service: Endoscopy;  Laterality: N/A;   ESOPHAGOGASTRODUODENOSCOPY N/A 03/06/2016   Procedure: ESOPHAGOGASTRODUODENOSCOPY (EGD);  Surgeon: Manya Silvas, MD;  Location: [facility] ENDOSCOPY;  Service: Endoscopy;  Laterality: N/A;   ESOPHAGOGASTRODUODENOSCOPY (EGD) WITH PROPOFOL N/A 04/04/2018   Procedure: ESOPHAGOGASTRODUODENOSCOPY (EGD) WITH biopsies;  Surgeon: Lucilla Lame, MD;  Location: [place];  Service: Endoscopy;  Laterality: N/A;   LAPAROSCOPIC OVARIAN CYSTECTOMY Left 03/07/2016   Procedure: LAPAROSCOPIC OVARIAN CYSTECTOMY;  Surgeon: Conard Novak, MD;  Location: [facility] ORS;  Service: Gynecology;  Laterality: Left;   OVARIAN CYST REMOVAL     TEMPOROMANDIBULAR JOINT SURGERY      Family History  Problem Relation Age of Onset   Heart attack Mother    Cervical cancer Mother        hyst in 71s, no chemo/rad   Hypertension Father    Prostate cancer Maternal Grandfather     Social History   Socioeconomic History   Marital status: Significant Other    Spouse name: Not on file   Number of children: Not on file   Years of education:  Not on file   Highest education level: Not on file  Occupational History   Not on file  Tobacco Use   Smoking status: Every Day    Packs/day: 0.75    Years: 10.00    Total pack years: 7.50    Types: Cigarettes   Smokeless tobacco: Never  Vaping Use   Vaping Use: Never used  Substance and Sexual Activity   Alcohol use: Yes    Alcohol/week: 0.0 standard drinks of alcohol    Comment: occasionally   Drug use: No   Sexual activity: Yes    Birth control/protection: None  Other Topics Concern   Not on file  Social History Narrative   Not on file   Social Determinants of Health   Financial Resource Strain: Not on file  Food Insecurity: Not on file  Transportation Needs: Not on file  Physical Activity: Not on file  Stress: Not on file  Social Connections: Not on file  Intimate Partner Violence: Not on file     Current Outpatient Medications:    alprazolam (XANAX) 2 MG tablet, Take 2 mg by mouth 2 (two) times daily. , Disp: , Rfl:    benzonatate (TESSALON) 100 MG capsule, Take 2 capsules (200 mg total) by mouth every 8 (eight) hours., Disp: 21 capsule, Rfl: 0   fluticasone (FLONASE) 50 MCG/ACT nasal spray, SPRAY 1 SPRAY INTO EACH NOSTRIL EVERY DAY, Disp: , Rfl:    glycopyrrolate (ROBINUL) 1 MG tablet, Take 2 mg by mouth 2 (two) times daily., Disp: , Rfl:    hydrOXYzine (ATARAX/VISTARIL) 25 MG tablet, Take by mouth., Disp: , Rfl:    ipratropium (ATROVENT) 0.06 % nasal spray, Place 2 sprays into both nostrils 4 (four) times daily., Disp: 15 mL, Rfl: 12   medroxyPROGESTERone (PROVERA) 10 MG tablet, Take 1 tablet (10 mg total) by mouth daily for 7 days. If no menses Q90 days, Disp: 21 tablet, Rfl: 0   pantoprazole (PROTONIX) 40 MG tablet, Take 1 tablet by mouth 2 (two) times daily., Disp: , Rfl:    promethazine-dextromethorphan (PROMETHAZINE-DM) 6.25-15 MG/5ML syrup, Take 5 mLs by mouth 4 (four) times daily as needed., Disp: 118 mL, Rfl: 0   Vilazodone HCl (VIIBRYD) 10 MG TABS, Take  10 mg by mouth daily. (Patient not taking: Reported on 06/14/2021), Disp: , Rfl:    VYVANSE 40 MG capsule, Take 40 mg by mouth 2 (two) times daily., Disp: , Rfl:     ROS:  Review of Systems  Constitutional:  Positive for fatigue. Negative for fever and unexpected weight change.  Respiratory:  Negative for cough, shortness of breath and wheezing.   Cardiovascular:  Negative for chest pain, palpitations and leg swelling.  Gastrointestinal:  Negative for blood in stool, constipation, diarrhea,  nausea and vomiting.  Endocrine: Negative for cold intolerance, heat intolerance and polyuria.  Genitourinary:  Positive for menstrual problem. Negative for dyspareunia, dysuria, flank pain, frequency, genital sores, hematuria, pelvic pain, urgency, vaginal bleeding, vaginal discharge and vaginal pain.  Musculoskeletal:  Negative for back pain, joint swelling and myalgias.  Skin:  Negative for rash.  Neurological:  Positive for headaches. Negative for dizziness, syncope, light-headedness and numbness.  Hematological:  Negative for adenopathy.  Psychiatric/Behavioral:  Positive for agitation and dysphoric mood. Negative for confusion, sleep disturbance and suicidal ideas. The patient is not nervous/anxious.    BREAST: No symptoms   Objective: There were no vitals taken for this visit.   Physical Exam Constitutional:      Appearance: She is well-developed.  Genitourinary:     Vulva normal.     Right Labia: No rash, tenderness or lesions.    Left Labia: No tenderness, lesions or rash.    No vaginal discharge, erythema or tenderness.      Right Adnexa: tender.    Right Adnexa: no mass present.    Left Adnexa: not tender and no mass present.    No cervical friability or polyp.     Uterus is not enlarged or tender.  Breasts:    Right: No mass, nipple discharge, skin change or tenderness.     Left: No mass, nipple discharge, skin change or tenderness.  Neck:     Thyroid: No thyromegaly.   Cardiovascular:     Rate and Rhythm: Normal rate and regular rhythm.     Heart sounds: Normal heart sounds. No murmur heard. Pulmonary:     Effort: Pulmonary effort is normal.     Breath sounds: Normal breath sounds.  Abdominal:     Palpations: Abdomen is soft.     Tenderness: There is no abdominal tenderness. There is no guarding or rebound.  Musculoskeletal:        General: Normal range of motion.     Cervical back: Normal range of motion.  Lymphadenopathy:     Cervical: No cervical adenopathy.  Neurological:     General: No focal deficit present.     Mental Status: She is alert and oriented to person, place, and time.     Cranial Nerves: No cranial nerve deficit.  Skin:    General: Skin is warm and dry.  Psychiatric:        Mood and Affect: Mood normal.        Behavior: Behavior normal.        Thought Content: Thought content normal.        Judgment: Judgment normal.  Vitals reviewed.     Assessment/Plan: Encounter for annual routine gynecological examination  Cervical cancer screening - Plan: Cytology - PAP  Screening for HPV (human papillomavirus) - Plan: Cytology - PAP  Dysplasia of cervix, low grade (CIN 1) - Plan: Cytology - PAP; repeat pap today. Will f/u with results.   Secondary oligomenorrhea--neg labs, no reason for sx. Pt aware of prog options for [facility] and protection of EM, but prog only options won't give monthly cycles. Pt unsure if she wants to prevent pregnancy with BC. Will cont to do provera prn no menses Q90 days. Rx RF. F/u prn.   Cyst of right ovary - Plan: US PELVIS TRANSVAGINAL NON-OB (TV ONLY); recheck GYN u/s. Will f/u with results.   No orders of the defined types were placed in this encounter.  GYN counsel family planning choices, adequate intake of calcium and vitamin D, diet and exercise     F/U  No follow-ups on file.  Jayse Hodkinson B. Alyssa Mancera, PA-C 06/14/2022 9:15 PM

## 2022-06-15 ENCOUNTER — Encounter: Payer: Self-pay | Admitting: Obstetrics and Gynecology

## 2022-06-15 ENCOUNTER — Ambulatory Visit: Payer: Medicaid Other | Admitting: Obstetrics and Gynecology

## 2022-06-15 DIAGNOSIS — Z124 Encounter for screening for malignant neoplasm of cervix: Secondary | ICD-10-CM

## 2022-06-15 DIAGNOSIS — N914 Secondary oligomenorrhea: Secondary | ICD-10-CM

## 2022-06-15 DIAGNOSIS — Z01419 Encounter for gynecological examination (general) (routine) without abnormal findings: Secondary | ICD-10-CM

## 2022-06-15 DIAGNOSIS — N870 Mild cervical dysplasia: Secondary | ICD-10-CM

## 2022-06-15 DIAGNOSIS — Z1151 Encounter for screening for human papillomavirus (HPV): Secondary | ICD-10-CM

## 2022-07-10 NOTE — Progress Notes (Unsigned)
PCP:  [facility]   No chief complaint on file.   HPI:      [name] is a 36 y.o. No obstetric history on file. whose LMP was No LMP recorded. (Menstrual status: Irregular Periods)., presents today for her annual examination.  Her menses have been irregular over the past year with neg labs (used to be monthly until 6/21). Menses now can be every 1-4 months, lasting 5-6 days, mod flow with heavy gushes on heavier days, with dime to quarter sized clots; no BTB, mod dysmen, improved with heating pad, can't take NSAIDs due to GI issues. Has taken provera Q90 days if no periods since pt not interested in [facility] for cycle control. Has had to take aygestin due to heavy flow as well. Did OCPs and depo in past with BTB. Hx of migraines with aura, tob use, borderline HTN at times. Also has mood changes/insomnia, appetitie changes before an during menses  Neg GYN u/s for irregular bleeding except 4.1 cm RTO cyst 3/22; repeat u/s 10/22 was negative Hx of ovar cysts in past, has occas RLQ pain, no dyspareunia  Sex activity: single partner, contraception - none. Doesn't think she can get pregnant but doesn't want to prevent it. No pain/bleeding. Last Pap: 06/14/21 Results were no abnormalities/neg HPV DNA; 04/26/20 at different office; Results were: POS HPV DNA 16; no cytology done at that time. 9/21 colpo with Dr. Jean Dominguez with CIN 1 on bx; repeat pap after 3 yrs due to ASCCP Hx of STDs: HPV  There is no FH of breast cancer. There is no FH of ovarian cancer; possible FH uterine vs cx vs ovarian in her mom in her 30s. Had to have hyst, no chemo/rad. Mom is deceased. The patient does do self-breast exams.  Tobacco use: < 1 ppd cigs, plans to quit by age 36 Alcohol use: few times wkly No drug use.  Exercise: very active  She does get adequate calcium but not Vitamin D in her diet.  Past Medical History:  Diagnosis Date   Anxiety    Arthritis    hands, legs   Cervical dysplasia 2005    Cervical high risk HPV (human papillomavirus) test positive 2021   Colitis    Depression    Dysrhythmia    PCP wants her to have wear a heart monitor   Family history of adverse reaction to anesthesia    Mother difficult to awaken after Succinycholine, son had PONV   GERD (gastroesophageal reflux disease)    Migraine    Rare.  Tension HA 3x/wk   Migraine with aura    Motion sickness    Ovarian cyst    Scoliosis    TMJ arthritis    TMJ syndrome     Past Surgical History:  Procedure Laterality Date   COLONOSCOPY WITH PROPOFOL N/A 07/03/2016   Procedure: COLONOSCOPY WITH PROPOFOL;  Surgeon: Vicki Minium, MD;  Location: [facility];  Service: Endoscopy;  Laterality: N/A;   COLONOSCOPY WITH PROPOFOL N/A 04/04/2018   Procedure: COLONOSCOPY WITH biopsies;  Surgeon: Vicki Minium, MD;  Location: [facility];  Service: Endoscopy;  Laterality: N/A;   ESOPHAGOGASTRODUODENOSCOPY N/A 03/06/2016   Procedure: ESOPHAGOGASTRODUODENOSCOPY (EGD);  Surgeon: Vicki Jun, MD;  Location: [facility] ENDOSCOPY;  Service: Endoscopy;  Laterality: N/A;   ESOPHAGOGASTRODUODENOSCOPY (EGD) WITH PROPOFOL N/A 04/04/2018   Procedure: ESOPHAGOGASTRODUODENOSCOPY (EGD) WITH biopsies;  Surgeon: Vicki Minium, MD;  Location: [facility] SUR[facility]ERY [facility];  Service: Endoscopy;  Laterality: N/A;  LAPAROSCOPIC OVARIAN CYSTECTOMY Left 03/07/2016   Procedure: LAPAROSCOPIC OVARIAN CYSTECTOMY;  Surgeon: Vicki Novak, MD;  Location: [facility] ORS;  Service: Gynecology;  Laterality: Left;   OVARIAN CYST REMOVAL     TEMPOROMANDIBULAR JOINT SURGERY      Family History  Problem Relation Age of Onset   Heart attack Mother    Cervical cancer Mother        hyst in 48s, no chemo/rad   Hypertension Father    Prostate cancer Maternal Grandfather     Social History   Socioeconomic History   Marital status: Significant Other    Spouse name: Not on file   Number of children: Not on file   Years of education: Not on file    Highest education level: Not on file  Occupational History   Not on file  Tobacco Use   Smoking status: Every Day    Packs/day: 0.75    Years: 10.00    Total pack years: 7.50    Types: Cigarettes   Smokeless tobacco: Never  Vaping Use   Vaping Use: Never used  Substance and Sexual Activity   Alcohol use: Yes    Alcohol/week: 0.0 standard drinks of alcohol    Comment: occasionally   Drug use: No   Sexual activity: Yes    Birth control/protection: None  Other Topics Concern   Not on file  Social History Narrative   Not on file   Social Determinants of Health   Financial Resource Strain: Not on file  Food Insecurity: Not on file  Transportation Needs: Not on file  Physical Activity: Not on file  Stress: Not on file  Social Connections: Not on file  Intimate Partner Violence: Not on file     Current Outpatient Medications:    alprazolam (XANAX) 2 MG tablet, Take 2 mg by mouth 2 (two) times daily. , Disp: , Rfl:    benzonatate (TESSALON) 100 MG capsule, Take 2 capsules (200 mg total) by mouth every 8 (eight) hours., Disp: 21 capsule, Rfl: 0   fluticasone (FLONASE) 50 MCG/ACT nasal spray, SPRAY 1 SPRAY INTO EACH NOSTRIL EVERY DAY, Disp: , Rfl:    glycopyrrolate (ROBINUL) 1 MG tablet, Take 2 mg by mouth 2 (two) times daily., Disp: , Rfl:    hydrOXYzine (ATARAX/VISTARIL) 25 MG tablet, Take by mouth., Disp: , Rfl:    ipratropium (ATROVENT) 0.06 % nasal spray, Place 2 sprays into both nostrils 4 (four) times daily., Disp: 15 mL, Rfl: 12   medroxyPROGESTERone (PROVERA) 10 MG tablet, Take 1 tablet (10 mg total) by mouth daily for 7 days. If no menses Q90 days, Disp: 21 tablet, Rfl: 0   pantoprazole (PROTONIX) 40 MG tablet, Take 1 tablet by mouth 2 (two) times daily., Disp: , Rfl:    promethazine-dextromethorphan (PROMETHAZINE-DM) 6.25-15 MG/5ML syrup, Take 5 mLs by mouth 4 (four) times daily as needed., Disp: 118 mL, Rfl: 0   Vilazodone HCl (VIIBRYD) 10 MG TABS, Take 10 mg by mouth  daily. (Patient not taking: Reported on 06/14/2021), Disp: , Rfl:    VYVANSE 40 MG capsule, Take 40 mg by mouth 2 (two) times daily., Disp: , Rfl:     ROS:  Review of Systems  Constitutional:  Positive for fatigue. Negative for fever and unexpected weight change.  Respiratory:  Negative for cough, shortness of breath and wheezing.   Cardiovascular:  Negative for chest pain, palpitations and leg swelling.  Gastrointestinal:  Negative for blood in stool, constipation, diarrhea, nausea and vomiting.  Endocrine: Negative for cold intolerance, heat intolerance and polyuria.  Genitourinary:  Positive for menstrual problem. Negative for dyspareunia, dysuria, flank pain, frequency, genital sores, hematuria, pelvic pain, urgency, vaginal bleeding, vaginal discharge and vaginal pain.  Musculoskeletal:  Negative for back pain, joint swelling and myalgias.  Skin:  Negative for rash.  Neurological:  Positive for headaches. Negative for dizziness, syncope, light-headedness and numbness.  Hematological:  Negative for adenopathy.  Psychiatric/Behavioral:  Positive for agitation and dysphoric mood. Negative for confusion, sleep disturbance and suicidal ideas. The patient is not nervous/anxious.    BREAST: No symptoms   Objective: There were no vitals taken for this visit.   Physical Exam Constitutional:      Appearance: She is well-developed.  Genitourinary:     Vulva normal.     Right Labia: No rash, tenderness or lesions.    Left Labia: No tenderness, lesions or rash.    No vaginal discharge, erythema or tenderness.      Right Adnexa: tender.    Right Adnexa: no mass present.    Left Adnexa: not tender and no mass present.    No cervical friability or polyp.     Uterus is not enlarged or tender.  Breasts:    Right: No mass, nipple discharge, skin change or tenderness.     Left: No mass, nipple discharge, skin change or tenderness.  Neck:     Thyroid: No thyromegaly.  Cardiovascular:      Rate and Rhythm: Normal rate and regular rhythm.     Heart sounds: Normal heart sounds. No murmur heard. Pulmonary:     Effort: Pulmonary effort is normal.     Breath sounds: Normal breath sounds.  Abdominal:     Palpations: Abdomen is soft.     Tenderness: There is no abdominal tenderness. There is no guarding or rebound.  Musculoskeletal:        General: Normal range of motion.     Cervical back: Normal range of motion.  Lymphadenopathy:     Cervical: No cervical adenopathy.  Neurological:     General: No focal deficit present.     Mental Status: She is alert and oriented to person, place, and time.     Cranial Nerves: No cranial nerve deficit.  Skin:    General: Skin is warm and dry.  Psychiatric:        Mood and Affect: Mood normal.        Behavior: Behavior normal.        Thought Content: Thought content normal.        Judgment: Judgment normal.  Vitals reviewed.     Assessment/Plan: Encounter for annual routine gynecological examination  Cervical cancer screening - Plan: Cytology - PAP  Screening for HPV (human papillomavirus) - Plan: Cytology - PAP  Dysplasia of cervix, low grade (CIN 1) - Plan: Cytology - PAP; repeat pap today. Will f/u with results.   Secondary oligomenorrhea--neg labs, no reason for sx. Pt aware of prog options for [facility] and protection of EM, but prog only options won't give monthly cycles. Pt unsure if she wants to prevent pregnancy with BC. Will cont to do provera prn no menses Q90 days. Rx RF. F/u prn.   Cyst of right ovary - Plan: US PELVIS TRANSVAGINAL NON-OB (TV ONLY); recheck GYN u/s. Will f/u with results.   No orders of the defined types were placed in this encounter.             GYN counsel family planning  choices, adequate intake of calcium and vitamin D, diet and exercise     F/U  No follow-ups on file.  Dynasty Holquin B. Kosisochukwu Burningham, PA-C 07/10/2022 9:56 AM

## 2022-07-11 ENCOUNTER — Other Ambulatory Visit (HOSPITAL_COMMUNITY)
Admission: RE | Admit: 2022-07-11 | Discharge: 2022-07-11 | Disposition: A | Discharge status: Home / Self Care | Payer: Medicaid Other | Source: Ambulatory Visit | Attending: Obstetrics and Gynecology | Admitting: Obstetrics and Gynecology

## 2022-07-11 ENCOUNTER — Encounter: Payer: Self-pay | Admitting: Obstetrics and Gynecology

## 2022-07-11 ENCOUNTER — Ambulatory Visit (INDEPENDENT_AMBULATORY_CARE_PROVIDER_SITE_OTHER): Payer: Medicaid Other | Admitting: Obstetrics and Gynecology

## 2022-07-11 VITALS — BP 110/80 | Ht 62.0 in | Wt 120.0 lb

## 2022-07-11 DIAGNOSIS — Z124 Encounter for screening for malignant neoplasm of cervix: Secondary | ICD-10-CM | POA: Diagnosis present

## 2022-07-11 DIAGNOSIS — N914 Secondary oligomenorrhea: Secondary | ICD-10-CM

## 2022-07-11 DIAGNOSIS — Z01419 Encounter for gynecological examination (general) (routine) without abnormal findings: Secondary | ICD-10-CM | POA: Diagnosis not present

## 2022-07-11 DIAGNOSIS — N870 Mild cervical dysplasia: Secondary | ICD-10-CM

## 2022-07-11 MED ORDER — MEDROXYPROGESTERONE ACETATE 10 MG PO TABS: 10.0000 mg | ORAL_TABLET | Freq: Every day | ORAL | 0 refills | Status: DC

## 2022-07-11 NOTE — Patient Instructions (Signed)
I value your feedback and you entrusting us with your care. If you get a [facility] patient survey, I would appreciate you taking the time to let us know about your experience today. Thank you! ? ? ?

## 2022-07-13 LAB — CYTOLOGY - PAP: Diagnosis: NEGATIVE

## 2023-05-13 ENCOUNTER — Other Ambulatory Visit: Payer: Self-pay | Admitting: Obstetrics and Gynecology

## 2023-05-13 DIAGNOSIS — N914 Secondary oligomenorrhea: Secondary | ICD-10-CM

## 2023-08-12 NOTE — Progress Notes (Unsigned)
PCP:  [facility]   No chief complaint on file.   HPI:      [name] is a 37 y.o. No obstetric history on file. whose LMP was No LMP recorded. (Menstrual status: Irregular Periods)., presents today for her annual examination.  Her menses continue to be irregular; with neg labs (used to be monthly until 6/21) and GYN u/s. Pt also under increased stress. Menses can be every 1-4 months, lasting 5-6 days, mod flow with heavy gushes on heavier days, with dime to quarter sized clots; no BTB, mod dysmen, improved with heating pad, can't take NSAIDs due to GI issues. Also occas has dysmen without any bleeding. Does provera Q90 days if no periods since pt not interested in [facility] for cycle control. Has had to take aygestin due to heavy flow as well. Did OCPs and depo in past with BTB. Hx of migraines with aura, tob use, borderline HTN at times. Also has mood changes/insomnia, appetitie changes before an during menses  Neg GYN u/s for irregular bleeding except 4.1 cm RTO cyst 3/22; repeat u/s 10/22 was negative. Hx of ovar cysts in past,  Sex activity: single partner, contraception - none. Doesn't think she can get pregnant but doesn't want to prevent it. No pain/bleeding. Has dryness, improved with lubricants.  Last Pap: 07/11/22  Results were no abnormalities/neg HPV DNA 2022; 04/26/20 at different office; Results were: POS HPV DNA 16; no cytology done at that time. 9/21 colpo with Dr. Jean Rosenthal with [facility] 1 on bx; repeat pap after 3 yrs due to ASCCP Hx of STDs: HPV  There is no FH of breast cancer. There is no FH of ovarian cancer; possible FH uterine vs cx vs ovarian in her mom in her 30s. Had to have hyst, no chemo/rad. Mom is deceased. The patient does do self-breast exams.  Tobacco use: 1/2 ppd Alcohol use: few times wkly No drug use.  Exercise: mod active  She does get adequate calcium and Vitamin D in her diet.  Past Medical History:  Diagnosis Date   Anxiety    Arthritis     hands, legs   Cervical dysplasia 2005   Cervical high risk HPV (human papillomavirus) test positive 2021   Colitis    Depression    Dysrhythmia    PCP wants her to have wear a heart monitor   Family history of adverse reaction to anesthesia    Mother difficult to awaken after Succinycholine, son had PONV   GERD (gastroesophageal reflux disease)    Migraine    Rare.  Tension HA 3x/wk   Migraine with aura    Motion sickness    Ovarian cyst    Scoliosis    TMJ arthritis    TMJ syndrome     Past Surgical History:  Procedure Laterality Date   COLONOSCOPY WITH PROPOFOL N/A 07/03/2016   Procedure: COLONOSCOPY WITH PROPOFOL;  Surgeon: Midge Minium, MD;  Location: [facility];  Service: Endoscopy;  Laterality: N/A;   COLONOSCOPY WITH PROPOFOL N/A 04/04/2018   Procedure: COLONOSCOPY WITH biopsies;  Surgeon: Midge Minium, MD;  Location: [facility];  Service: Endoscopy;  Laterality: N/A;   ESOPHAGOGASTRODUODENOSCOPY N/A 03/06/2016   Procedure: ESOPHAGOGASTRODUODENOSCOPY (EGD);  Surgeon: Scot Jun, MD;  Location: [facility] ENDOSCOPY;  Service: Endoscopy;  Laterality: N/A;   ESOPHAGOGASTRODUODENOSCOPY (EGD) WITH PROPOFOL N/A 04/04/2018   Procedure: ESOPHAGOGASTRODUODENOSCOPY (EGD) WITH biopsies;  Surgeon: Midge Minium, MD;  Location: [facility];  Service: Endoscopy;  Laterality:  N/A;   LAPAROSCOPIC OVARIAN CYSTECTOMY Left 03/07/2016   Procedure: LAPAROSCOPIC OVARIAN CYSTECTOMY;  Surgeon: Conard Novak, MD;  Location: [facility] ORS;  Service: Gynecology;  Laterality: Left;   OVARIAN CYST REMOVAL     TEMPOROMANDIBULAR JOINT SURGERY      Family History  Problem Relation Age of Onset   Heart attack Mother    Cervical cancer Mother        hyst in 51s, no chemo/rad   Hypertension Father    Prostate cancer Maternal Grandfather     Social History   Socioeconomic History   Marital status: Significant Other    Spouse name: Not on file   Number of children: Not on file    Years of education: Not on file   Highest education level: Not on file  Occupational History   Not on file  Tobacco Use   Smoking status: Every Day    Current packs/day: 0.75    Average packs/day: 0.8 packs/day for 10.0 years (7.5 ttl pk-yrs)    Types: Cigarettes   Smokeless tobacco: Never  Vaping Use   Vaping status: Never Used  Substance and Sexual Activity   Alcohol use: Yes    Alcohol/week: 0.0 standard drinks of alcohol    Comment: occasionally   Drug use: No   Sexual activity: Yes    Birth control/protection: None  Other Topics Concern   Not on file  Social History Narrative   Not on file   Social Determinants of Health   Financial Resource Strain: Medium Risk (12/14/2022)   Received from [facility]   Overall Financial Resource Strain (CARDIA)    Difficulty of Paying Living Expenses: Somewhat hard  Food Insecurity: No Food Insecurity (12/14/2022)   Received from [facility]   Hunger Vital Sign    Worried About Running Out of Food in the Last Year: Never true    Ran Out of Food in the Last Year: Never true  Transportation Needs: No Transportation Needs (12/14/2022)   Received [facility]   PRAPARE - Transportation    Lack of Transportation (Medical): No    Lack of Transportation (Non-Medical): No  Physical Activity: Not on file  Stress: Not on file  Social Connections: Not on file  Intimate Partner Violence: Not on file     Current Outpatient Medications:    alprazolam (XANAX) 2 MG tablet, Take 2 mg by mouth 2 (two) times daily. , Disp: , Rfl:    [employer] PURELAX 17 g packet, Take 17 g by mouth daily., Disp: , Rfl:    glycopyrrolate (ROBINUL) 1 MG tablet, Take 2 mg by mouth 2 (two) times daily., Disp: , Rfl:    lisdexamfetamine (VYVANSE) 50 MG capsule, Take 1 capsule by mouth every morning., Disp: , Rfl:    medroxyPROGESTERone (PROVERA) 10 MG tablet, Take 1 tablet (10 mg total) by mouth daily for 7 days. If no menses Q90 days, Disp: 28 tablet,  Rfl: 0   pantoprazole (PROTONIX) 40 MG tablet, Take 1 tablet by mouth 2 (two) times daily., Disp: , Rfl:    Vilazodone HCl (VIIBRYD) 10 MG TABS, Take 10 mg by mouth daily., Disp: , Rfl:     ROS:  Review of Systems  Constitutional:  Positive for fatigue. Negative for fever and unexpected weight change.  Respiratory:  Negative for cough, shortness of breath and wheezing.   Cardiovascular:  Negative for chest pain, palpitations and leg swelling.  Gastrointestinal:  Positive for constipation. Negative for blood  in stool, diarrhea, nausea and vomiting.  Endocrine: Negative for cold intolerance, heat intolerance and polyuria.  Genitourinary:  Positive for menstrual problem. Negative for dyspareunia, dysuria, flank pain, frequency, genital sores, hematuria, pelvic pain, urgency, vaginal bleeding, vaginal discharge and vaginal pain.  Musculoskeletal:  Negative for back pain, joint swelling and myalgias.  Skin:  Negative for rash.  Neurological:  Positive for headaches. Negative for dizziness, syncope, light-headedness and numbness.  Hematological:  Negative for adenopathy.  Psychiatric/Behavioral:  Positive for agitation. Negative for confusion, dysphoric mood, sleep disturbance and suicidal ideas. The patient is not nervous/anxious.    BREAST: No symptoms   Objective: There were no vitals taken for this visit.   Physical Exam Constitutional:      Appearance: She is well-developed.  Genitourinary:     Vulva normal.     Right Labia: No rash, tenderness or lesions.    Left Labia: No tenderness, lesions or rash.    No vaginal discharge, erythema or tenderness.      Right Adnexa: not tender and no mass present.    Left Adnexa: not tender and no mass present.    No cervical friability or polyp.     Uterus is not enlarged or tender.  Breasts:    Right: No mass, nipple discharge, skin change or tenderness.     Left: No mass, nipple discharge, skin change or tenderness.  Neck:      Thyroid: No thyromegaly.  Cardiovascular:     Rate and Rhythm: Normal rate and regular rhythm.     Heart sounds: Normal heart sounds. No murmur heard. Pulmonary:     Effort: Pulmonary effort is normal.     Breath sounds: Normal breath sounds.  Abdominal:     Palpations: Abdomen is soft.     Tenderness: There is no abdominal tenderness. There is no guarding or rebound.  Musculoskeletal:        General: Normal range of motion.     Cervical back: Normal range of motion.  Lymphadenopathy:     Cervical: No cervical adenopathy.  Neurological:     General: No focal deficit present.     Mental Status: She is alert and oriented to person, place, and time.     Cranial Nerves: No cranial nerve deficit.  Skin:    General: Skin is warm and dry.  Psychiatric:        Mood and Affect: Mood normal.        Behavior: Behavior normal.        Thought Content: Thought content normal.        Judgment: Judgment normal.  Vitals reviewed.     Assessment/Plan:  Encounter for annual routine gynecological examination  Cervical cancer screening - Plan: Cytology - PAP  Dysplasia of cervix, low grade (CIN 1) - Plan: Cytology - PAP  Secondary oligomenorrhea--neg labs, no reason for sx; possible PCOS. Pt aware of prog options for [facility] and protection of EM. Will cont to do provera prn no menses Q90 days, considering other prog options. Rx RF. F/u prn.    No orders of the defined types were placed in this encounter.             GYN counsel family planning choices, adequate intake of calcium and vitamin D, diet and exercise     F/U  No follow-ups on file.  Vicki Upshur B. Rashell Shambaugh, PA-C 08/12/2023 5:17 PM

## 2023-08-14 ENCOUNTER — Encounter: Payer: Self-pay | Admitting: Obstetrics and Gynecology

## 2023-08-14 ENCOUNTER — Ambulatory Visit (INDEPENDENT_AMBULATORY_CARE_PROVIDER_SITE_OTHER): Payer: MEDICAID | Admitting: Obstetrics and Gynecology

## 2023-08-14 VITALS — BP 108/64 | Ht 62.0 in | Wt 124.0 lb

## 2023-08-14 DIAGNOSIS — B3731 Acute candidiasis of vulva and vagina: Secondary | ICD-10-CM

## 2023-08-14 DIAGNOSIS — Z01419 Encounter for gynecological examination (general) (routine) without abnormal findings: Secondary | ICD-10-CM

## 2023-08-14 DIAGNOSIS — Z01411 Encounter for gynecological examination (general) (routine) with abnormal findings: Secondary | ICD-10-CM

## 2023-08-14 DIAGNOSIS — N914 Secondary oligomenorrhea: Secondary | ICD-10-CM

## 2023-08-14 DIAGNOSIS — Z803 Family history of malignant neoplasm of breast: Secondary | ICD-10-CM | POA: Insufficient documentation

## 2023-08-14 LAB — POCT WET PREP WITH KOH
Clue Cells Wet Prep HPF POC: NEGATIVE
KOH Prep POC: NEGATIVE
Trichomonas, UA: NEGATIVE

## 2023-08-14 MED ORDER — MEDROXYPROGESTERONE ACETATE 10 MG PO TABS: 10.0000 mg | ORAL_TABLET | Freq: Every day | ORAL | 0 refills | Status: AC

## 2023-08-14 MED ORDER — FLUCONAZOLE 150 MG PO TABS: 150.0000 mg | ORAL_TABLET | Freq: Once | ORAL | 0 refills | Status: AC

## 2023-08-14 NOTE — Patient Instructions (Signed)
I value your feedback and you entrusting us with your care. If you get a [facility] patient survey, I would appreciate you taking the time to let us know about your experience today. Thank you! ? ? ?

## 2023-08-17 ENCOUNTER — Other Ambulatory Visit: Payer: Self-pay

## 2023-08-17 ENCOUNTER — Encounter: Payer: Self-pay | Admitting: Obstetrics and Gynecology

## 2023-08-17 MED ORDER — FLUCONAZOLE 150 MG PO TABS: 150.0000 mg | ORAL_TABLET | Freq: Once | ORAL | 0 refills | Status: AC

## 2023-09-26 DIAGNOSIS — Z803 Family history of malignant neoplasm of breast: Secondary | ICD-10-CM

## 2023-09-26 DIAGNOSIS — Z1371 Encounter for nonprocreative screening for genetic disease carrier status: Secondary | ICD-10-CM

## 2023-09-26 HISTORY — DX: Encounter for nonprocreative screening for genetic disease carrier status: Z13.71

## 2023-09-26 HISTORY — DX: Family history of malignant neoplasm of breast: Z80.3

## 2023-10-23 ENCOUNTER — Encounter: Payer: Self-pay | Admitting: Obstetrics and Gynecology

## 2023-11-08 ENCOUNTER — Encounter: Payer: Self-pay | Admitting: Obstetrics and Gynecology

## 2023-11-08 ENCOUNTER — Telehealth: Payer: Self-pay | Admitting: Obstetrics and Gynecology

## 2023-11-08 NOTE — Telephone Encounter (Signed)
Pt is aware of neg MyRisk results except APC VUS.  IBIS=14.3%/riskscore=16.3%. No increased screening recommendations.   Patient understands these results only apply to her and her children, and this is not indicative of genetic testing results of her other family members. It is recommended that her other family members, particularly her father, have genetic testing done.  Pt also understands negative genetic testing doesn't mean she will never get any of these cancers.   Hard copy mailed to pt. F/u prn.
